# Patient Record
Sex: Female | Born: 1935
Health system: Southern US, Community
[De-identification: ages and names within clinical notes are randomized; demographics above are authoritative.]

## PROBLEM LIST (undated history)

## (undated) DIAGNOSIS — I482 Chronic atrial fibrillation, unspecified: Secondary | ICD-10-CM

## (undated) DIAGNOSIS — I10 Essential (primary) hypertension: Secondary | ICD-10-CM

## (undated) DIAGNOSIS — I2510 Atherosclerotic heart disease of native coronary artery without angina pectoris: Secondary | ICD-10-CM

## (undated) DIAGNOSIS — D509 Iron deficiency anemia, unspecified: Secondary | ICD-10-CM

## (undated) DIAGNOSIS — E039 Hypothyroidism, unspecified: Secondary | ICD-10-CM

## (undated) DIAGNOSIS — N390 Urinary tract infection, site not specified: Secondary | ICD-10-CM

## (undated) DIAGNOSIS — G4700 Insomnia, unspecified: Secondary | ICD-10-CM

## (undated) DIAGNOSIS — N814 Uterovaginal prolapse, unspecified: Secondary | ICD-10-CM

## (undated) DIAGNOSIS — M1990 Unspecified osteoarthritis, unspecified site: Secondary | ICD-10-CM

## (undated) DIAGNOSIS — M8580 Other specified disorders of bone density and structure, unspecified site: Secondary | ICD-10-CM

## (undated) DIAGNOSIS — E785 Hyperlipidemia, unspecified: Secondary | ICD-10-CM

## (undated) HISTORY — DX: Uterovaginal prolapse, unspecified: N81.4

## (undated) HISTORY — DX: Essential (primary) hypertension: I10

## (undated) HISTORY — DX: Chronic atrial fibrillation, unspecified: I48.20

## (undated) HISTORY — PX: ABDOMINAL HYSTERECTOMY: SHX81

## (undated) HISTORY — DX: Hyperlipidemia, unspecified: E78.5

## (undated) HISTORY — PX: OVARIAN CYST REMOVAL: SHX89

## (undated) HISTORY — DX: Hypothyroidism, unspecified: E03.9

## (undated) HISTORY — DX: Iron deficiency anemia, unspecified: D50.9

## (undated) HISTORY — PX: TONSILLECTOMY: SUR1361

## (undated) HISTORY — PX: ROTATOR CUFF REPAIR: SHX139

## (undated) HISTORY — PX: LIPOMA EXCISION: SHX5283

## (undated) HISTORY — DX: Insomnia, unspecified: G47.00

## (undated) HISTORY — DX: Other specified disorders of bone density and structure, unspecified site: M85.80

## (undated) HISTORY — PX: TEAR DUCT PROBING: SHX793

## (undated) HISTORY — DX: Atherosclerotic heart disease of native coronary artery without angina pectoris: I25.10

## (undated) HISTORY — DX: Unspecified osteoarthritis, unspecified site: M19.90

## (undated) HISTORY — DX: Urinary tract infection, site not specified: N39.0

## (undated) HISTORY — PX: APPENDECTOMY: SHX54

---

## 1999-11-09 ENCOUNTER — Other Ambulatory Visit: Admission: RE | Admit: 1999-11-09 | Discharge: 1999-11-09 | Payer: Self-pay | Admitting: Internal Medicine

## 1999-12-09 ENCOUNTER — Encounter: Admission: RE | Admit: 1999-12-09 | Discharge: 1999-12-09 | Payer: Self-pay | Admitting: Internal Medicine

## 1999-12-09 ENCOUNTER — Encounter: Payer: Self-pay | Admitting: Internal Medicine

## 2000-11-29 ENCOUNTER — Other Ambulatory Visit: Admission: RE | Admit: 2000-11-29 | Discharge: 2000-11-29 | Payer: Self-pay | Admitting: Internal Medicine

## 2000-12-09 ENCOUNTER — Encounter: Payer: Self-pay | Admitting: Internal Medicine

## 2000-12-09 ENCOUNTER — Encounter: Admission: RE | Admit: 2000-12-09 | Discharge: 2000-12-09 | Payer: Self-pay | Admitting: Internal Medicine

## 2001-05-29 ENCOUNTER — Encounter: Payer: Self-pay | Admitting: Internal Medicine

## 2001-05-29 ENCOUNTER — Encounter: Admission: RE | Admit: 2001-05-29 | Discharge: 2001-05-29 | Payer: Self-pay | Admitting: Internal Medicine

## 2001-11-30 ENCOUNTER — Other Ambulatory Visit: Admission: RE | Admit: 2001-11-30 | Discharge: 2001-11-30 | Payer: Self-pay | Admitting: Internal Medicine

## 2001-12-11 ENCOUNTER — Encounter: Admission: RE | Admit: 2001-12-11 | Discharge: 2001-12-11 | Payer: Self-pay | Admitting: Internal Medicine

## 2001-12-11 ENCOUNTER — Encounter: Payer: Self-pay | Admitting: Internal Medicine

## 2002-03-16 ENCOUNTER — Encounter: Payer: Self-pay | Admitting: Internal Medicine

## 2002-03-16 ENCOUNTER — Encounter: Admission: RE | Admit: 2002-03-16 | Discharge: 2002-03-16 | Payer: Self-pay | Admitting: Internal Medicine

## 2002-12-18 ENCOUNTER — Encounter: Admission: RE | Admit: 2002-12-18 | Discharge: 2002-12-18 | Payer: Self-pay | Admitting: Internal Medicine

## 2002-12-18 ENCOUNTER — Encounter: Payer: Self-pay | Admitting: Internal Medicine

## 2003-12-24 ENCOUNTER — Other Ambulatory Visit: Admission: RE | Admit: 2003-12-24 | Discharge: 2003-12-24 | Payer: Self-pay | Admitting: Internal Medicine

## 2003-12-25 ENCOUNTER — Encounter: Admission: RE | Admit: 2003-12-25 | Discharge: 2003-12-25 | Payer: Self-pay | Admitting: Internal Medicine

## 2004-12-25 ENCOUNTER — Encounter: Admission: RE | Admit: 2004-12-25 | Discharge: 2004-12-25 | Payer: Self-pay | Admitting: Internal Medicine

## 2005-11-01 HISTORY — PX: CORONARY ARTERY BYPASS GRAFT: SHX141

## 2005-12-27 ENCOUNTER — Encounter: Admission: RE | Admit: 2005-12-27 | Discharge: 2005-12-27 | Payer: Self-pay | Admitting: Internal Medicine

## 2006-06-09 ENCOUNTER — Encounter: Admission: RE | Admit: 2006-06-09 | Discharge: 2006-06-09 | Payer: Self-pay | Admitting: Interventional Cardiology

## 2006-06-14 ENCOUNTER — Encounter (INDEPENDENT_AMBULATORY_CARE_PROVIDER_SITE_OTHER): Payer: Self-pay | Admitting: *Deleted

## 2006-06-14 ENCOUNTER — Inpatient Hospital Stay (HOSPITAL_COMMUNITY): Admission: RE | Admit: 2006-06-14 | Discharge: 2006-06-23 | Payer: Self-pay | Admitting: Interventional Cardiology

## 2006-07-28 ENCOUNTER — Encounter: Admission: RE | Admit: 2006-07-28 | Discharge: 2006-07-28 | Payer: Self-pay | Admitting: Cardiothoracic Surgery

## 2006-07-29 ENCOUNTER — Ambulatory Visit (HOSPITAL_COMMUNITY): Admission: RE | Admit: 2006-07-29 | Discharge: 2006-07-29 | Payer: Self-pay | Admitting: Cardiothoracic Surgery

## 2006-08-09 ENCOUNTER — Encounter: Admission: RE | Admit: 2006-08-09 | Discharge: 2006-08-09 | Payer: Self-pay | Admitting: Cardiothoracic Surgery

## 2007-11-02 HISTORY — PX: CATARACT EXTRACTION, BILATERAL: SHX1313

## 2008-09-23 ENCOUNTER — Encounter: Admission: RE | Admit: 2008-09-23 | Discharge: 2008-09-23 | Payer: Self-pay | Admitting: Internal Medicine

## 2009-01-28 ENCOUNTER — Encounter: Admission: RE | Admit: 2009-01-28 | Discharge: 2009-01-28 | Payer: Self-pay | Admitting: Interventional Cardiology

## 2009-12-30 DIAGNOSIS — N390 Urinary tract infection, site not specified: Secondary | ICD-10-CM

## 2009-12-30 HISTORY — DX: Urinary tract infection, site not specified: N39.0

## 2010-06-22 ENCOUNTER — Other Ambulatory Visit: Admission: RE | Admit: 2010-06-22 | Discharge: 2010-06-22 | Payer: Self-pay | Admitting: Obstetrics and Gynecology

## 2010-11-21 ENCOUNTER — Encounter: Payer: Self-pay | Admitting: Thoracic Surgery (Cardiothoracic Vascular Surgery)

## 2010-11-22 ENCOUNTER — Encounter: Payer: Self-pay | Admitting: Internal Medicine

## 2011-01-20 ENCOUNTER — Encounter (HOSPITAL_COMMUNITY)
Admission: RE | Admit: 2011-01-20 | Discharge: 2011-01-20 | Disposition: A | Discharge status: Home / Self Care | Payer: Medicare Other | Source: Ambulatory Visit | Attending: Obstetrics and Gynecology | Admitting: Obstetrics and Gynecology

## 2011-01-20 LAB — BASIC METABOLIC PANEL
BUN: 12 mg/dL (ref 6–23)
Creatinine, Ser: 0.79 mg/dL (ref 0.4–1.2)
GFR calc non Af Amer: >60 mL/min (ref >60)

## 2011-01-20 LAB — SURGICAL PCR SCREEN: Staphylococcus aureus: POSITIVE — AB

## 2011-01-20 LAB — CBC
MCH: 28.1 pg (ref 26.0–34.0)
MCHC: 31.2 g/dL (ref 30.0–36.0)
MCV: 90.1 fL (ref 78.0–100.0)
Platelets: 173 10*3/uL (ref 150–400)
RDW: 13.5 % (ref 11.5–15.5)

## 2011-01-26 ENCOUNTER — Ambulatory Visit (HOSPITAL_COMMUNITY)
Admission: RE | Admit: 2011-01-26 | Discharge: 2011-01-27 | Disposition: A | Discharge status: Home / Self Care | Payer: Medicare Other | Source: Ambulatory Visit | Attending: Obstetrics and Gynecology | Admitting: Obstetrics and Gynecology

## 2011-01-26 ENCOUNTER — Other Ambulatory Visit: Payer: Self-pay | Admitting: Obstetrics and Gynecology

## 2011-01-26 DIAGNOSIS — Z01818 Encounter for other preprocedural examination: Secondary | ICD-10-CM | POA: Insufficient documentation

## 2011-01-26 DIAGNOSIS — N812 Incomplete uterovaginal prolapse: Secondary | ICD-10-CM | POA: Insufficient documentation

## 2011-01-26 DIAGNOSIS — N840 Polyp of corpus uteri: Secondary | ICD-10-CM | POA: Insufficient documentation

## 2011-01-26 DIAGNOSIS — Z01812 Encounter for preprocedural laboratory examination: Secondary | ICD-10-CM | POA: Insufficient documentation

## 2011-01-27 LAB — CBC
Hemoglobin: 10.3 g/dL — ABNORMAL LOW (ref 12.0–15.0)
MCH: 28.3 pg (ref 26.0–34.0)
MCHC: 31.5 g/dL (ref 30.0–36.0)
Platelets: 125 10*3/uL — ABNORMAL LOW (ref 150–400)

## 2011-02-04 NOTE — Op Note (Signed)
NAMEORAL, HALLGREN                ACCOUNT NO.:  [id number]  MEDICAL RECORD NO.:  [id number]           PATIENT TYPE:  O  LOCATION:  9312                          FACILITY:  [facility]  PHYSICIAN:  Brigitte Soderberg L. Jamelle Goldston, M.D.DATE OF BIRTH:  1932-07-16  DATE OF PROCEDURE:  01/26/2011 DATE OF DISCHARGE:                              OPERATIVE REPORT   PREOPERATIVE DIAGNOSES:  Uterine prolapse, cystocele, and rectocele.  POSTOPERATIVE DIAGNOSES:  Uterine prolapse, cystocele, and rectocele.  PROCEDURE: 1. Total vaginal hysterectomy. 2. Rogelio Seen culdoplasty. 3. Anterior repair. 4. Posterior repair.  SURGEON:  Makara Lanzo L. Vincente Poli, M.D.  ASSISTANTFreddy Finner, M.D.  ANESTHESIA:  Combined spinal epidural.  FINDINGS:  Uterus prolapse, cystocele, and rectocele.  SPECIMENS:  Uterus and cervix sent to Pathology.  ESTIMATED BLOOD LOSS:  100 mL.  COMPLICATIONS:  None.  PROCEDURE:  The patient is taken to the operating room.  Her anesthesia was administered.  She was then prepped and draped in the usual sterile fashion.  A Foley catheter was inserted.  A weighted speculum was placed in the vagina.  She was noted to have grade 3 cervical uterine prolapse. A circumferential incision was made around the cervix and the overlying vaginal epithelium was then pushed away from the cervix until we entered the cul-de-sac posteriorly.  We then clamped the uterosacral cardinal ligaments.  Each pedicle was clamped, cut, and suture ligated using 0 Vicryl suture.  The cervix was very long, but the body of the uterus was very small.  We were then able to enter the anterior cul-de-sac without any difficulty and then clamp the broad ligament on either side.  Each pedicle was clamped, cut, and suture ligated using 0 Vicryl suture and then we reached the triple pedicle without difficulty, placed clamps across that.  The specimen was removed, identified the cervix and uterus and the pedicles were secured with a  free tie in a suture ligature of 0 Vicryl suture.  The ovaries were very high in the pelvis.  We could visualize it, they appeared normal, but they were not able to be removed vaginally.  At this point, I then placed a McCall stitch in the cul-de- sac to minimize chance of enterocele later on, any help with suspension of the vaginal cuff.  It was suspended to the posterior vaginal cuff. The posterior vaginal cuff was then closed with a running locked stitch using 0 Vicryl suture.  The poster two thirds of the cuff was then closed completely using interrupted using 0 Vicryl.  At this point, we still noticed a grade 3 cystocele.  We then made a midline incision over the vaginal epithelium, reduced the vesicovaginal tissue, reduced the cystocele nicely, and then closed with 2 pursestrings using 0 Vicryl suture.  We then excised the redundant vaginal epithelium and closed the overlying vaginal epithelium with nice reduction of the cystocele with 2- 0 Vicryl suture.  We then tied down the McCall stitch.  I had excellent suspension of the anterior compartment as well as the vaginal cuff.  At this point, we went posteriorly.  She had a very small rectocele, but  she did have kind of a gaping perineum, and so I decided to do a small rectocele repair with perineoplasty.  A small V-shaped incision was made in the perineum.  This was carried up about 2 cm into the vagina.  The vaginal epithelium was dissected free from the rectovaginal fascia.  The stitches were placed to reapproximate the fascia in the midline.  The redundant tissue was removed and then the perineum was brought together with a 0 Vicryl stitch of the perineum and then the vaginal epithelium was closed using 2-0 Vicryl running stitch.  At the end of the procedure, hemostasis was excellent.  She had wonderful urine output. There was no oozing from any of the suture lines.  Vaginal packing with Estrace cream was inserted in to the  vagina.  All sponge, lap, and instrument counts were correct x2.  The patient went to recovery room in stable condition.     Ruthia Person L. Vincente Poli, M.D.     Florestine Avers  D:  01/26/2011  T:  01/27/2011  Job:  [id number]  Electronically Signed by Marcelle Overlie M.D. on 02/04/2011 07:14:21 AM

## 2011-03-19 NOTE — Op Note (Signed)
[name], [name]                ACCOUNT NO.:  [id number]   MEDICAL RECORD NO.:  [id number]          PATIENT TYPE:  INP   LOCATION:  2306                         FACILITY:  [facility]   PHYSICIAN:  Salvatore Decent. Dorris Fetch, M.D.DATE OF BIRTH:  1932/04/25   DATE OF PROCEDURE:  06/16/2006  DATE OF DISCHARGE:                                 OPERATIVE REPORT   PREOPERATIVE DIAGNOSIS:  Left main disease with exertional angina.   POSTOPERATIVE DIAGNOSIS:  Left main disease with exertional angina.   PROCEDURE:  Median sternotomy, extracorporeal circulation, coronary bypass  grafting x2 (left internal mammary artery to LAD, saphenous vein graft to  left circumflex at the bifurcation to obtuse marginal II and III) endoscopic  vein harvest right thigh.   SURGEON:  Salvatore Decent. Dorris Fetch, M.D.   ASSISTANT:  Rowe Clack, P.A.-C.   ANESTHESIA:  General.   FINDINGS:  A good-quality targets, good-quality conduits, diagonal too small  to graft.   CLINICAL NOTE:  [name] is a 77 year old lady who presented with exertional  angina.  Her stress test showed some mild ECG abnormalities, but normal  perfusion.  Attempt was made to treat her medically but she continued to  have symptoms.  She underwent cardiac catheterization by [name] which  showed a 70-75% ostial left main stenosis.  She was advised to undergo  coronary bypass grafting for survival benefit and relief of symptoms.  She  understood the risks of surgery, accepted and agreed to proceed.   OPERATIVE NOTE:  [name] was brought to the preop holding area on  06/16/2006.  There the anesthesia service under the direction of Dr. Sharee Holster placed lines for monitoring arterial, central venous and pulmonary  arterial pressure.  ECG leads were placed for continuous telemetry.  Intravenous antibiotics were administered.  She was taken to the operating  room, anesthetized and intubated.  A Foley catheter was placed.  The chest,  abdomen and legs were prepped and draped in the usual fashion.   A median sternotomy was performed and the left internal mammary artery was  harvested using standard technique.  Simultaneously incision made in the  medial aspect of the right leg at the level of the knee and the greater  saphenous vein was harvested from the thigh endoscopically.  The saphenous  vein was of excellent quality as was the mammary artery.  5000 units of  heparin was administered during the vessel harvest, the remainder of the  full heparin dose was given prior to opening the pericardium.   The pericardium was opened.  The ascending aorta was inspected and there is  no evidence of atherosclerotic disease.  The aorta was cannulated via  concentric 2-0 Ethibond pledgeted pursestring sutures.  A dual stage venous  cannula placed via pursestring suture in the right atrial appendage.  After  confirming adequate anticoagulation with ACT measurement, cardiopulmonary  bypass was commenced and flows were maintained per protocol.  The patient  was not actively cooled but the temperature was allowed to drift to 34  degrees Celsius.  The coronary arteries were inspected and anastomotic  sites  were chosen.  The conduits were inspected and cut to length.  A foam pad was  placed in the pericardium to protect the left phrenic nerve.  A temperature  probe was placed in the myocardial septum.  A cardioplegic cannula placed in  the ascending aorta.   The aorta was crossclamped, left ventricle was emptied via the aortic root  vent.  A cardiac arrest was achieved with combination of cold antegrade  blood cardioplegia and topical iced saline.  After achieving a complete  diastolic arrest adequate myocardial septal cooling the following distal  anastomoses were performed.   First a reverse saphenous vein graft was placed end-to-side to left  circumflex, the site of bifurcation OM II and III, this was a 2 mm vessel  1.5 mm probe  passed into both distal branches.  The vein was of good  quality, it was anastomosed end-to-side with running 7-0 Prolene suture.  Both anastomoses were probed proximally, distally prior to tying the suture.  There is good flow through the graft.  Cardioplegia was administered.  There  was good hemostasis.   Additional cardioplegia then was administered down the aortic root and the  left internal mammary artery was brought through a window in the  pericardium.  The distal end was beveled and was anastomosed end-to-side to  the distal LAD with a running 8-0 Prolene suture.  Both the LAD and the  mammary were 1.5 mm good quality vessels.  At completion of the mammary to  LAD anastomosis, the bulldog clamps briefly removed to inspect for  hemostasis.  Immediate rapid septal rewarming was noted.  The bulldog clamp  was replaced.  The mammary pedicle was tacked epicardial surface of the  heart with 6-0 Prolene sutures.  Additional cardioplegia was administered.   The cardioplegia cannula was removed from the ascending aorta.  The vein  graft was cut to length and the proximal vein graft anastomosis was  performed to 4.5 mm punch aortotomy with a running 6-0 Prolene suture.  At  completion of the proximal anastomosis, the patient was placed in  Trendelenburg position.  Lidocaine was administered.  The aortic root was de-  aired and the aortic crossclamp was removed.  The total crossclamp time was  39 minutes.  The patient was rewarmed.  All proximal and distal anastomoses  were inspected for hemostasis.  Epicardial pacing wires were placed on the  right ventricle and right atrium.  The patient did require two  defibrillations but then resumed sinus rhythm and was in sinus rhythm at  time separation from bypass.  After the patient been rewarmed to a core  temperature of 37 degrees Celsius, he was weaned from cardiopulmonary bypass without difficulty.  The total bypass time was 54 minutes.  The  initial  cardiac index was greater than 2 liters per minute per meter squared.   A test dose protamine was administered was well tolerated.  The atrial and  aortic cannulae were removed.  The remaining protamine was administered  without incident.  Chest irrigated with 1 liter of warm normal saline  containing 1 gram of vancomycin.  Hemostasis was achieved.  The pericardium  was reapproximated with interrupted 3-0 silk sutures and came together  easily without tension.  The left pleural and mediastinal chest tubes were  placed through separate subcostal incisions.  The sternum was closed with  interrupted heavy gauge stainless steel wires, it came together easily  without tension.  The remaining incisions closed standard fashion.  Subcuticular closure used for the skin.  All sponge, needle and sponge  counts were correct at end of the procedure.   After closure the chest, the patient had a drop in her cardiac index.  It  had only been checked one time post bypass. Her index was 1.5 after closure  of the chest.  All other parameters remained the same.  She had excellent  urine output, her blood pressure, PA pressures were stable.  Her heart rate  was stable.  She did not have any arrhythmias and no ST changes.  A low-dose  dopamine infusion was initiated as a precaution.  There was no indication to  reopen  the chest.  A mixed venous blood gas was performed and was consistent with  excellent cardiac output with a pO2 of 40 and a mixed venous oxygen  saturation of 70%.  The patient then was transported from the operating room  to the surgical intensive care unit in good condition.           ______________________________  Salvatore Decent Dorris Fetch, M.D.     SCH/MEDQ  D:  06/16/2006  T:  06/16/2006  Job:  [id number]   cc:   Corky Crafts, MD  Theressa Millard, M.D.

## 2011-03-19 NOTE — Discharge Summary (Signed)
[name], [name]                ACCOUNT NO.:  [id number]   MEDICAL RECORD NO.:  [id number]          Walker TYPE:  INP   LOCATION:  2021                         FACILITY:  [facility]   PHYSICIAN:  Salvatore Decent. Dorris Fetch, M.D.DATE OF BIRTH:  [birth year]   DATE OF ADMISSION:  06/14/2006  DATE OF DISCHARGE:                                 DISCHARGE SUMMARY   DATE OF BIRTH:  [birth year]   PRIMARY DIAGNOSIS:  Left main disease with exertional angina.   IN HOSPITAL DIAGNOSES:  1. Postoperative thrombocytopenia.  2. Postoperative acute blood loss anemia.  3. Volume overload postoperatively.   SECONDARY DIAGNOSES:  1. Hypertension.  2. Hyperthyroidism.  3. Hyperlipidemia.  4. Status post appendectomy.  5. Status post ovarian cystectomy.  6. Status post removal of lipoma from right upper abdomen.  7. Status post left tear duct repair.   IN HOSPITAL OPERATIONS AND PROCEDURES:  1. Cardiac catheterization with left heart catheterization, left      ventriculogram, coronary angiogram, and abdominal aortogram.  2. Coronary artery bypass grafting x2 using Marisa left internal mammary      artery to left anterior descending, saphenous vein graft to left      circumflex at Marisa bifurcation to obtuse marginal 2 and 3.  Endoscopic      vein harvesting from right thigh.   HISTORY AND PHYSICAL AND HOSPITAL COURSE:  [name] is a 77 year old female  who presented with exertional angina.  Stress test was done, which showed  some mild EKG abnormalities, but with normal perfusion.  Attempt was made to  treat her medically, but Walker continued to have symptoms.  [name]  took her for cardiac catheterization on 06/16/2006, which showed a 70% to  75% ostial left main stenosis.  Following this, Dr. Dorris Fetch was  consulted.  Dr. Dorris Fetch discussed with Walker underlying coronary  artery bypass grafting.  He discussed Marisa risks and benefits with Walker.  [name] acknowledged her understanding  and agreed to proceed.  Surgery  was scheduled for 06/16/2006.  Prior to undergoing surgery, [name]  underwent bilateral carotid duplex ultrasound which showed no significant  stenosis.  For details of [name]'s past medical history and physical  exam, please see dictated history and physical.   [name] was taken to Marisa operating room on 06/16/2006, where she  underwent coronary artery bypass grafting x2 using a left internal mammary  artery to Marisa left anterior descending, saphenous vein graft to left  circumflex at Marisa bifurcation to obtuse marginal 2 and 3.  [name]  tolerated this procedure well and was transferred up to Marisa intensive care  unit in stable condition.  Immediately following surgery, [name] seemed  to be hemodynamically stable.  Hematocrit was 29%.  [name] was  extubated Marisa evening of surgery.  Following extubation, [name] seemed  to be alert and oriented x3, neurologically intact.  In Marisa initial  postoperative period, [name] developed hypotension.  She required  dopamine and Neo-Synephrine drips.  She was able to be weaned off these  drips over Marisa next day and  blood pressure remained stable.  Systolic blood  pressure still borderline low.  Beta-blocker was started slowly and this was  monitored closely.  [name] was stable with systolic blood pressure in  Marisa 90s on postoperative day 5.  Again, this was monitored closely during  Marisa remainder of her hospital stay.  Also, postoperatively [name]  developed thrombocytopenia.  Her platelet count dropped to 111.  This was  monitored.  Over Marisa next several days, it did start to trend up.  Marisa  Walker remained on a 325 aspirin.  By postoperative day 3, it was 113.  On  postoperative day 2, [name]'s hemoglobin and hematocrit dropped to 8.7  and 25.  She was asymptomatic.  This was monitored closely.  It did trend  back up postoperative day 3 to 9.4 and 27.  [name] was  also noted to be  volume overloaded.  She was started on diuretics.  Weight was near baseline  prior to discharge home.  She preoperatively weighed 63.6 kg and on  postoperative day 5 was noted to be 66.9 kg.  During Marisa initial  postoperative phase, [name] complained of nausea.  Pain medications  were switched.  Bowel sounds were good with bowel movement on postoperative  day 4.  After switch of Marisa pain medications, [name]'s nausea did start  to resolve.  She was tolerating a regular diet well prior to discharge home.   Chest tubes and lines were discontinued in Marisa normal fashion.  [name]  was transferred out to 2000 on postoperative day 3.  [name] was seen to  be afebrile postoperatively.  She remained in normal sinus rhythm.  With  aggressive use of incentive spirometer, [name] was able to be weaned  off oxygen, satting greater than 90% on room air.  Incisions were clean,  dry, and intact, and healing well.  She was out of bed ambulating well.   [name] is tentatively ready for discharge home postoperative day six,  06/22/2006.  Followup appointment has been scheduled with Dr. Tyrone Sage for  07/14/2006 at 11:20 a.m.  [name] will need to contact Dr. Hoyle Barr  office and schedule a followup appointment with him in 2 weeks.  At this  appointment, [name] will obtain a PA and lateral chest x-ray, which she  will then bring with her to Dr. Dennie Maizes appointment.   ACTIVITY:  [name] received instructions on no driving and no lifting over  10 pounds.  She is told to ambulate 3-4 times per day, progress as  tolerated, and continue her breathing exercises.   INCISIONAL CARE:  [name] is told to wash her incisions using soap and  water.  She is to contact Marisa office if she develops any drainage or opening  from any of her incision sites.  She is also to contact Marisa office if she develops a fever greater than 101.0 degrees Fahrenheit.  She  acknowledged  her understanding.   DIET:  [name] is educated on diet to be low-fat low-salt.  She  acknowledged her understanding.   DISCHARGE MEDICATIONS:  1. Aspirin 325 mg daily.  2. Toprol-XL 25 mg daily.  3. Lipitor 10 mg at night.  4. Synthroid 75 mcg daily.  5. Lasix 40 mg daily x3 days.  6. Potassium chloride 20 mEq daily x3 days.  7. Fosamax 70 mg weekly.  8. Darvocet-N 100 one to two tabs q.4-6 hours p.r.n. pain.  Theda Belfast, PA    ______________________________  Salvatore Decent Dorris Fetch, M.D.    KMD/MEDQ  D:  06/21/2006  T:  06/21/2006  Job:  [id number]   cc:   Salvatore Decent. Dorris Fetch, M.D.  Corky Crafts, MD

## 2011-03-19 NOTE — Cardiovascular Report (Signed)
NAMEJENNY, [name]                ACCOUNT NO.:  [id number]   MEDICAL RECORD NO.:  [id number]          PATIENT TYPE:  INP   LOCATION:  2306                         FACILITY:  [facility]   PHYSICIAN:  Corky Crafts, MDDATE OF BIRTH:  06-01-1932   DATE OF PROCEDURE:  06/16/2006  DATE OF DISCHARGE:                              CARDIAC CATHETERIZATION   REFERRING PHYSICIAN:  Theressa Millard, M.D.   PROCEDURES PERFORMED:  Left heart catheterization, left ventriculogram,  coronary angiogram, and abdominal aortogram.   OPERATOR:  Corky Crafts, M.D.   INDICATIONS:  Chest pain.   PROCEDURE:  The risks, benefits of cardiac catheterization were explained to  the patient and informed consent was obtained. The patient was brought to  the cath lab and placed on the table.  She was prepped and draped in usual  sterile fashion.  Her right groin was infiltrated with 1% lidocaine. A 6-  [place] arterial sheath was placed into a right femoral artery using modified  Seldinger technique.  Left coronary artery angiography was performed using a  JL-4.0 catheter.  The catheter was advanced to the ostium of the left main  under fluoroscopic guidance.  Digital angiography was performed in multiple  projections using hand injection of contrast. Right coronary artery  angiography was then performed. A JR-4.0 catheter was advanced to the ostium  of the right coronary artery and digital angiography was performed in  multiple projections using hand injection of contrast. Left ventriculography  was then performed using a pigtail catheter. Catheter was advanced to the  aortic valve and across the valve into the ventricle under fluoroscopic  guidance.  Hemodynamic pressure assessment was performed.  A 30 degree RAO  ventriculogram was then performed using power injection of contrast.  Catheter was withdrawn under continuous hemodynamic pressure monitoring and  the catheter was then pulled back to the  abdominal aorta to the level of the  renal arteries.  A power injection contrast was done in the AP projection  for aortogram and the sheath will then be removed using manual compression.   FINDINGS:  There is a 70% left main stenosis at the ostium. There was  significant pressure damping with a 6-French catheter.  The LAD is a large  vessel with a 70% midvessel stenosis.  The circumflex is a large vessel  which was angiographically normal.  There is a medium-sized OM-1 and OM-2  which also appeared angiographically normal.  The first diagonal was a  branching vessel which was angiographically normal. The right coronary  artery is a dominant vessel with luminal irregularities.  The left  ventriculogram showed normal left jugular function with ejection fraction of  60%.   HEMODYNAMIC RESULTS:  Left ventricular pressure 139/7 with an LVEDP of 16  mmHg.  Aortic pressure of 136/68 with a mean aortic pressure of 96 mmHg.  The abdominal aortogram showed a small suprarenal aortic aneurysm. There is  no significant renal artery stenosis.   IMPRESSION:  1. Significant left main and left anterior descending disease.  2. Normal left ventricular function with normal hemodynamics.  3. No renal artery  stenosis.   RECOMMENDATIONS:  Given the severity of her left main disease we will  consult CVTS for CABG.  We will continue aspirin and consider starting  heparin as well. Continue her beta blocker and statin.      Corky Crafts, MD  Electronically Signed     JSV/MEDQ  D:  06/16/2006  T:  06/17/2006  Job:  [id number]

## 2011-03-19 NOTE — Consult Note (Signed)
[name], [name]                ACCOUNT NO.:  [id number]   MEDICAL RECORD NO.:  [id number]          PATIENT TYPE:  INP   LOCATION:  3704                         FACILITY:  [facility]   PHYSICIAN:  Salvatore Decent. Dorris Fetch, M.D.DATE OF BIRTH:  12-04-1931   DATE OF CONSULTATION:  06/14/2006  DATE OF DISCHARGE:                                   CONSULTATION   REASON FOR CONSULTATION:  Left main disease.   HISTORY OF PRESENT ILLNESS:  [name] is a 77 year old woman who I have  known for many years in caring for [name].  This is my first doctor-  patient contact with Marisa, however.  She was in Marisa usual state of good  health until June of this year when she started having chest tightness and  shortness of breath when walking Marisa dog.  She has not had any rest or  nocturnal symptoms, but she had noted that this pain was increasing in  frequency.  She was concerned.  [name] has a history of coronary  disease and saw Dr. Earl Gala.  A stress sestamibi was performed, and there  were some ECG changes which reversed, but Marisa baseline ECG was abnormal.  There was no evidence for ischemia on the perfusion images.  She had normal  left ventricular function.  She was started on a medical regimen but  continued to have discomfort and was scheduled for cardiac catheterization.  Cardiac catheterization was performed today, and she was found to have a 70%  ostial left main stenosis, as well as a 70% mid-LAD stenosis.  The remainder  of Marisa coronaries did not have any significant disease, and Marisa left  ventricular function was normal.  She currently is pain free.   PAST MEDICAL HISTORY:  Significant for hypertension, hyperthyroidism,  dyslipidemia.   PAST SURGICAL HISTORY:  Appendectomy, ovarian cystectomy, lipoma removed  from right upper abdomen, and left tear duct repair.   MEDICATIONS:  1. Synthroid 75 mcg p.o. daily.  2. Lipitor 10 mg p.o. daily.  3. Metoprolol 25 mg p.o. daily.  4.  Triamterene/hydrochlorothiazide 37.5/25 one tablet daily.  5. Baby aspirin 1 daily.  6. Fosamax 70 mg weekly.  7. Nitroglycerin p.r.n.   ALLERGIES:  SHE IS ALLERGIC TO SULFA.   FAMILY HISTORY:  There is no history of premature coronary disease.   SOCIAL HISTORY:  She is married.  She lives with [name].  She is a  retired Librarian, academic.  She does not smoke or drink.   REVIEW OF SYSTEMS:  Chest pain, shortness of breath in association with the  chest pain.  She wears corrective lenses.  Otherwise, all other systems are  negative.   PHYSICAL EXAMINATION:  GENERAL:  The patient is a well-appearing 77 year old  white female in no acute distress.  VITAL SIGNS:  Marisa blood pressure is 118/73, pulse 55, respirations 18,  oxygen saturation 95% on room air.  She is 5 feet 5 inches tall and 140  pounds.  In general, she is well developed and well nourished in no acute  distress.  HEENT:  She  is wearing glasses.  Otherwise unremarkable.  She has good  dentition.  NECK:  Supple without thyromegaly, adenopathy, or bruits.  LUNGS:  Clear with equal breath sounds bilaterally.  CARDIAC:  Regular rate and rhythm.  Normal S1 and S2.  No rubs, murmurs, or  gallops.  ABDOMEN:  Soft and nontender.  EXTREMITIES:  Without clubbing, cyanosis, or edema.  She has palpable  pulses, dorsalis pedis and posterior tibial, bilaterally.  She has some mild  superficial varicosities.  NEUROLOGIC:  She is alert and oriented x3.  SKIN:  Warm and dry.   LABORATORY DATA:  White count 6.1, hematocrit 40, platelets 247.  PT 11.4,  PTT 34.  Sodium 137, potassium 3.9, BUN and creatinine 14 and 0.9, glucose  82.  Chest x-ray has no active disease.   IMPRESSION:  The patient is a very sweet 77 year old lady who presents with  new-onset exertional angina.  At catheterization, she has a 70% ostial left  main stenosis.  Coronary artery bypass grafting is indicated for survival  benefit and relief of symptoms.   I have discussed with Marisa and [name]  the indications, risks, benefits, and alternative treatments.  They  understand the need for bypass grafting in the setting of left main disease.  They understand the risks include but are not limited to death, stroke, MI,  DVT, PE, bleeding, possible need for transfusion, infection, as well as  other organ/system dysfunction, including respiratory and renal or GI  complications.  She understands and accepts these risks and agrees to  proceed.  We will plan to proceed with Marisa operation on Thursday morning.  That is the first available operating date.  That is June 16, 2006.           ______________________________  Salvatore Decent. Dorris Fetch, M.D.     SCH/MEDQ  D:  06/14/2006  T:  06/15/2006  Job:  [id number]   cc:   [name], M.D.  Corky Crafts, MD

## 2011-12-17 ENCOUNTER — Other Ambulatory Visit: Payer: Self-pay | Admitting: Obstetrics and Gynecology

## 2013-07-25 ENCOUNTER — Other Ambulatory Visit: Payer: Self-pay | Admitting: Obstetrics and Gynecology

## 2013-10-29 ENCOUNTER — Encounter: Payer: Self-pay | Admitting: Cardiology

## 2013-10-29 DIAGNOSIS — E039 Hypothyroidism, unspecified: Secondary | ICD-10-CM | POA: Insufficient documentation

## 2013-10-29 DIAGNOSIS — I4891 Unspecified atrial fibrillation: Secondary | ICD-10-CM

## 2013-10-29 DIAGNOSIS — I209 Angina pectoris, unspecified: Secondary | ICD-10-CM | POA: Insufficient documentation

## 2013-10-29 DIAGNOSIS — M899 Disorder of bone, unspecified: Secondary | ICD-10-CM | POA: Insufficient documentation

## 2013-10-29 DIAGNOSIS — I2510 Atherosclerotic heart disease of native coronary artery without angina pectoris: Secondary | ICD-10-CM | POA: Insufficient documentation

## 2013-10-29 DIAGNOSIS — I10 Essential (primary) hypertension: Secondary | ICD-10-CM

## 2013-11-08 ENCOUNTER — Encounter: Payer: Self-pay | Admitting: Interventional Cardiology

## 2013-11-08 ENCOUNTER — Ambulatory Visit (INDEPENDENT_AMBULATORY_CARE_PROVIDER_SITE_OTHER): Payer: 59 | Admitting: Interventional Cardiology

## 2013-11-08 VITALS — BP 150/82 | HR 78 | Ht 63.0 in | Wt 121.8 lb

## 2013-11-08 DIAGNOSIS — I4891 Unspecified atrial fibrillation: Secondary | ICD-10-CM

## 2013-11-08 DIAGNOSIS — R0602 Shortness of breath: Secondary | ICD-10-CM

## 2013-11-08 DIAGNOSIS — E782 Mixed hyperlipidemia: Secondary | ICD-10-CM | POA: Insufficient documentation

## 2013-11-08 DIAGNOSIS — I10 Essential (primary) hypertension: Secondary | ICD-10-CM

## 2013-11-08 DIAGNOSIS — I2510 Atherosclerotic heart disease of native coronary artery without angina pectoris: Secondary | ICD-10-CM

## 2013-11-08 LAB — BRAIN NATRIURETIC PEPTIDE: PRO B NATRI PEPTIDE: 154 pg/mL — AB (ref 0.0–100.0)

## 2013-11-08 MED ORDER — APIXABAN 5 MG PO TABS: 5.0000 mg | ORAL_TABLET | Freq: Two times a day (BID) | ORAL | Status: DC

## 2013-11-08 MED ORDER — METOPROLOL SUCCINATE ER 50 MG PO TB24: ORAL_TABLET | ORAL | Status: DC

## 2013-11-08 NOTE — Progress Notes (Signed)
Patient ID: [name], female   DOB: 03/02/1932, 80 y.o.   MRN: [id number]    [address] [place], [place]  [address] Phone: [phone]) [phone] Fax:  [phone]  Date:  11/08/2013   ID:  [name], DOB 03/11/1932, MRN [id number]  PCP:  No primary provider on file.      History of Present Illness: [name] is a 80 y.o. female with CAD who had CABG in 2007. She had hysterectomy in May 2012 and did well. In Jan 2014, she had a fullness in her left chest not related to exertion. There was left chest pain with deep breathing. This has resolved. Left leg cramps at night. No cramps with walking. CAD/ASCVD:  bowling without problems. No sx like what she had before CABG. c/o Shortness of breath when she bends down.  Denies : Diaphoresis.  Dizziness.  Dyspnea on exertion.  Fatigue.  Leg edema.  Nitroglycerin.  Orthopnea.  Palpitations.  Syncope.     Wt Readings from Last 3 Encounters:  11/08/13 121 lb 12.8 oz (55.248 kg)     Past Medical History  Diagnosis Date  . Hypertension   . Coronary artery disease     anginal equivalent=dysnpnea, resolved with CABG, dyspnea has recurred, 2014  . Osteopenia     involved on BMD 06/01/07 (T score-1.6); slightly worse in 3/13 (T score -1.6; FRAX 3%  and 12%)-plan rechcekin 2016  . Iron deficiency anemia   . Hyperlipidemia   . Hypothyroidism   . Osteoarthritis   . UTI (lower urinary tract infection) 3/11  . Uterine prolapse     post hysterectomy, Dr. Vincente PoliGrewal  . Insomnia     on [place], failed other meds    Current Outpatient Prescriptions  Medication Sig Dispense Refill  . aspirin 81 MG tablet Take 81 mg by mouth daily.      [name]. atorvastatin (LIPITOR) 20 MG tablet Take 20 mg by mouth daily.      . Calcium Carbonate-Vitamin D (CALCIUM-D PO) Take by mouth.      . Cholecalciferol (VITAMIN D) 2000 UNITS CAPS Take by mouth daily.      [name]. levothyroxine (SYNTHROID, LEVOTHROID) 75 MCG tablet Take 75 mcg by mouth. 1 tablet 6  days a week and a 1/2 tab one day a week      . metoprolol succinate (TOPROL-XL) 25 MG 24 hr tablet Take 25 mg by mouth daily.      . Misc Natural Products (OSTEO BI-FLEX ADV JOINT SHIELD PO) Take by mouth daily.      . nitroGLYCERIN (NITROSTAT) 0.4 MG SL tablet Place 0.4 mg under the tongue every 5 (five) minutes as needed for chest pain.      . Omega-3 Fatty Acids (FISH OIL) 1000 MG CAPS Take by mouth daily.      [name]. omeprazole (PRILOSEC) 20 MG capsule Take 20 mg by mouth daily.      . vitamin B-12 (CYANOCOBALAMIN) 500 MCG tablet Take 500 mcg by mouth daily.      [name]. zolpidem (AMBIEN) 5 MG tablet Take 5 mg by mouth at bedtime as needed for sleep.       No current facility-administered medications for this visit.    Allergies:    Allergies  Allergen Reactions  . Sulfa Antibiotics Rash    Social History:  The patient  reports that she has never smoked. She does not have any smokeless tobacco history on file.   Family History:  The patient's family history includes CAD in her father; Cancer in her brother; Diabetes in her brother and father; Heart attack in her father; Heart disease in her mother; Lymphoma in her sister; Syncope episode in her mother; Ulcers in her mother.   ROS:  Please see the history of present illness.  No nausea, vomiting.  No fevers, chills.  No focal weakness.  No dysuria. Fatigue, SHOB.   All other systems reviewed and negative.   PHYSICAL EXAM: VS:  BP 150/82  Pulse 78  Ht 5\' 3"  (1.6 m)  Wt 121 lb 12.8 oz (55.248 kg)  BMI 21.58 kg/m2 Well nourished, well developed, in no acute distress HEENT: normal Neck: no JVD, no carotid bruits Cardiac:  normal S1, S2; irregular Lungs:  clear to auscultation bilaterally, no wheezing, rhonchi or rales Abd: soft, nontender, no hepatomegaly Ext: no edema Skin: warm and dry Neuro:   no focal abnormalities noted  EKG:  AFib, rate controlled.     ASSESSMENT AND PLAN: AFib: Increase Toprol to 50 mg daily.  Echo. BNP to  eval SHOB. Start Eliquis for stroke prevention. Stop aspirin.  Discuss cardioversion in a few weeks Coronary atherosclerosis of native coronary artery  Stop Aspirin Tablet, 81 mg, 1 tablet, Orally, Once a day with Eliquis. Notes: Occasional chest pressure, may be related to AFib.  Negative stress test in 1/14. 2. Essential hypertension, benign  Continue Metoprolol Succinate Tablet Extended Release 24 Hour, 25 MG, 1 tablet, Orally, Once a day Notes: Well controlled.  3. Mixed hyperlipidemia  Continue Fish Oil Capsule, 1000 MG, 1 tablet, Orally, once a day Continue Lipitor Tablet, 20 MG, 1 tablet, Orally, Once a day Notes: Controlled. LDL 64 in 12/13. LD52 in 12/14L  4. Bruit  IMAGING: EC Carotid Doppler (Ordered for 02/20/2013) Notes: right carotid. Negative for significant disease in 5/14.     Signed, Fredric Mare, MD, [facility] 11/08/2013 8:14 AM

## 2013-11-08 NOTE — Patient Instructions (Addendum)
Your physician has recommended you make the following change in your medication:   1. Stop Aspirin.  2. Start Eliquis 5 mg 1 tablet by mouth twice a day.  3. Increase Metoprolol to 50 mg daily.  Your physician recommends that you schedule a follow-up appointment in: 3 weeks for OV with Dr. Eldridge DaceVaranasi and set up appt with coumadin for 3 week Eliquis follow up.  Your physician has requested that you have an echocardiogram. Echocardiography is a painless test that uses sound waves to create images of your heart. It provides your doctor with information about the size and shape of your heart and how well your heart's chambers and valves are working. This procedure takes approximately one hour. There are no restrictions for this procedure.  Your physician recommends that you return for lab work today for BNP.

## 2013-11-09 ENCOUNTER — Telehealth: Payer: Self-pay | Admitting: General Surgery

## 2013-11-09 MED ORDER — FUROSEMIDE 20 MG PO TABS: 20.0000 mg | ORAL_TABLET | Freq: Every day | ORAL | Status: DC

## 2013-11-09 NOTE — Telephone Encounter (Signed)
rx sent in for pt and pt is aware  

## 2013-11-09 NOTE — Telephone Encounter (Signed)
Message copied by Nita SellsORSON, Ladonya Jerkins L on Fri Nov 09, 2013  1:06 PM ------      Message from: Corky CraftsVARANASI, JAYADEEP S      Created: Thu Nov 08, 2013 10:14 PM       Mildly increased fluid level.  COuld try furosemide 20 mg daily.  [facility]. ------

## 2013-11-12 ENCOUNTER — Telehealth: Payer: Self-pay | Admitting: Interventional Cardiology

## 2013-11-12 DIAGNOSIS — Z79899 Other long term (current) drug therapy: Secondary | ICD-10-CM

## 2013-11-12 NOTE — Telephone Encounter (Signed)
New message  Patient is taking Lasix and there is just a slight change in the SOB. This is just a FYI.[name]. Please call if there is any further information to give her.

## 2013-11-12 NOTE — Telephone Encounter (Signed)
WILL FORWARD TO  DR  Forest GleasonVERANASI   FOR  REVIEW .Zack Seal/CY

## 2013-11-13 NOTE — Telephone Encounter (Signed)
Continue lasix for now. Does she have labs scheduled.?

## 2013-11-14 NOTE — Telephone Encounter (Signed)
Pt doesn't have labs scheduled. Echo sch for 11/27/13 and f/u OV sch for 12/05/13.

## 2013-11-15 NOTE — Telephone Encounter (Signed)
Ok to check BMet on 1/27

## 2013-11-15 NOTE — Telephone Encounter (Signed)
Pt notified. Sob is some better. Bmet ordered.

## 2013-11-15 NOTE — Addendum Note (Signed)
Addended byOrlene Plum: Scarlette Hogston H on: 11/15/2013 01:05 PM   Modules accepted: Orders

## 2013-11-16 ENCOUNTER — Telehealth: Payer: Self-pay | Admitting: Interventional Cardiology

## 2013-11-16 NOTE — Telephone Encounter (Signed)
New message     Need prior authorization for ins to pay for eliquis-----call customer serv [phone]--[employer] ins

## 2013-11-20 ENCOUNTER — Telehealth: Payer: Self-pay | Admitting: Interventional Cardiology

## 2013-11-20 NOTE — Telephone Encounter (Signed)
Pt notified. Pt hasnt been drinking very much. Pt will try to drink more water. Pt legs are feeling better once she walked more today. To Dr. Eldridge DaceVaranasi.

## 2013-11-20 NOTE — Telephone Encounter (Signed)
Riki RuskJeremy, should pt have Bmet sooner than 11/27/13? Dr. Eldridge DaceVaranasi is out of the office today. Any suggestions?

## 2013-11-20 NOTE — Telephone Encounter (Signed)
Her vitamin D was normal (55.5) last year and potassium was 4.5 on a CMET 4 weeks ago with her PCP. Been on lipitor for years so I don't think the problem is any of these.  Make sure she is drinking plenty of water and staying hydrated. I don't think she needs labs any sooner.

## 2013-11-20 NOTE — Telephone Encounter (Signed)
New message  Patient is having severe leg cramps. Taking is taking Eliqis and Lasix, please call and advise.

## 2013-11-26 ENCOUNTER — Telehealth: Payer: Self-pay | Admitting: *Deleted

## 2013-11-26 NOTE — Telephone Encounter (Signed)
Submitted to [employer] [facility] for PA eliquis

## 2013-11-27 ENCOUNTER — Ambulatory Visit (INDEPENDENT_AMBULATORY_CARE_PROVIDER_SITE_OTHER): Payer: 59 | Admitting: *Deleted

## 2013-11-27 ENCOUNTER — Encounter: Payer: Self-pay | Admitting: Cardiology

## 2013-11-27 ENCOUNTER — Ambulatory Visit (HOSPITAL_COMMUNITY): Payer: Medicare Other | Attending: Interventional Cardiology | Admitting: Cardiology

## 2013-11-27 DIAGNOSIS — I4891 Unspecified atrial fibrillation: Secondary | ICD-10-CM | POA: Insufficient documentation

## 2013-11-27 DIAGNOSIS — Z79899 Other long term (current) drug therapy: Secondary | ICD-10-CM

## 2013-11-27 DIAGNOSIS — Z951 Presence of aortocoronary bypass graft: Secondary | ICD-10-CM | POA: Insufficient documentation

## 2013-11-27 DIAGNOSIS — R0602 Shortness of breath: Secondary | ICD-10-CM

## 2013-11-27 DIAGNOSIS — E785 Hyperlipidemia, unspecified: Secondary | ICD-10-CM | POA: Insufficient documentation

## 2013-11-27 DIAGNOSIS — I2510 Atherosclerotic heart disease of native coronary artery without angina pectoris: Secondary | ICD-10-CM

## 2013-11-27 DIAGNOSIS — R0989 Other specified symptoms and signs involving the circulatory and respiratory systems: Secondary | ICD-10-CM | POA: Insufficient documentation

## 2013-11-27 DIAGNOSIS — I079 Rheumatic tricuspid valve disease, unspecified: Secondary | ICD-10-CM | POA: Insufficient documentation

## 2013-11-27 DIAGNOSIS — R0609 Other forms of dyspnea: Secondary | ICD-10-CM | POA: Insufficient documentation

## 2013-11-27 DIAGNOSIS — I10 Essential (primary) hypertension: Secondary | ICD-10-CM | POA: Insufficient documentation

## 2013-11-27 LAB — BASIC METABOLIC PANEL
BUN: 11 mg/dL (ref 6–23)
CO2: 27 mEq/L (ref 19–32)
Calcium: 9.2 mg/dL (ref 8.4–10.5)
Chloride: 107 mEq/L (ref 96–112)
Creatinine, Ser: 0.9 mg/dL (ref 0.4–1.2)
GFR: 63.28 mL/min (ref >60.00)
Glucose, Bld: 88 mg/dL (ref 70–99)
POTASSIUM: 4.2 meq/L (ref 3.5–5.1)
Sodium: 141 mEq/L (ref 135–145)

## 2013-11-27 NOTE — Progress Notes (Signed)
Echo performed. 

## 2013-11-27 NOTE — Telephone Encounter (Signed)
[employer] approved eliquis through 11/25/2014 with PA [id number]

## 2013-11-28 NOTE — Progress Notes (Signed)
Quick Note:  Preliminary report reviewed by triage nurse and sent to MD desk. ______ 

## 2013-12-05 ENCOUNTER — Ambulatory Visit (INDEPENDENT_AMBULATORY_CARE_PROVIDER_SITE_OTHER): Payer: 59 | Admitting: Interventional Cardiology

## 2013-12-05 ENCOUNTER — Encounter: Payer: Self-pay | Admitting: Interventional Cardiology

## 2013-12-05 VITALS — BP 138/83 | HR 77 | Ht 63.0 in | Wt 119.0 lb

## 2013-12-05 DIAGNOSIS — I4891 Unspecified atrial fibrillation: Secondary | ICD-10-CM

## 2013-12-05 DIAGNOSIS — I2510 Atherosclerotic heart disease of native coronary artery without angina pectoris: Secondary | ICD-10-CM

## 2013-12-05 DIAGNOSIS — E782 Mixed hyperlipidemia: Secondary | ICD-10-CM

## 2013-12-05 DIAGNOSIS — I10 Essential (primary) hypertension: Secondary | ICD-10-CM

## 2013-12-05 NOTE — Progress Notes (Signed)
Patient ID: [name], female   DOB: 04/21/1932, 80 y.o.   MRN: [id number] Patient ID: [name], female   DOB: 01/18/1932, 80 y.o.   MRN: [id number]    [address] [place], [place]  [address] Phone: [phone] Fax:  [phone]) [phone]  Date:  12/05/2013   ID:  [name], DOB 03/14/1932, MRN [id number]  PCP:  Darnelle BosSBORNE,JAMES CHARLES, MD      History of Present Illness: [name] is a 80 y.o. female with CAD who had CABG in 2007. She had hysterectomy in May 2012 and did well. In Jan 2014, she had a fullness in her left chest not related to exertion. There was left chest pain with deep breathing. This has resolved. Left leg cramps at night. No cramps with walking. CAD/ASCVD:  bowling without problems. No sx like what she had before CABG. c/o Shortness of breath when she bends down.  Denies : Diaphoresis.  Dizziness.  Dyspnea on exertion.  Fatigue.  Leg edema.  Nitroglycerin.  Orthopnea.  Palpitations.  Syncope.   She was diagnosed with atrial fibrillation in January 2015. Her metoprolol was increased. She was started on anticoagulation with Eliquis. She has done well since that time. She  had some lightheadedness intermittently yesterday. Other than that, she had been feeling well. She denies palpitations. Overall she feels like she is remaining active and he would complete all her activities. We discussed cardioversion to try to get her back in normal sinus rhythm. She is really not interested in cardioversion at this time.   Wt Readings from Last 3 Encounters:  12/05/13 119 lb (53.978 kg)  11/08/13 121 lb 12.8 oz (55.248 kg)     Past Medical History  Diagnosis Date  . Hypertension   . Coronary artery disease     anginal equivalent=dysnpnea, resolved with CABG, dyspnea has recurred, 2014  . Osteopenia     involved on BMD 06/01/07 (T score-1.6); slightly worse in 3/13 (T score -1.6; FRAX 3%  and 12%)-plan rechcekin 2016  . Iron deficiency anemia   .  Hyperlipidemia   . Hypothyroidism   . Osteoarthritis   . UTI (lower urinary tract infection) 3/11  . Uterine prolapse     post hysterectomy, Dr. Vincente PoliGrewal  . Insomnia     on [place], failed other meds    Current Outpatient Prescriptions  Medication Sig Dispense Refill  . apixaban (ELIQUIS) 5 MG TABS tablet Take 1 tablet (5 mg total) by mouth 2 (two) times daily.  60 tablet  6  . atorvastatin (LIPITOR) 20 MG tablet Take 20 mg by mouth daily.      . Calcium Carbonate-Vitamin D (CALCIUM-D PO) Take by mouth.      . Cholecalciferol (VITAMIN D) 2000 UNITS CAPS Take by mouth daily.      . furosemide (LASIX) 20 MG tablet Take 1 tablet (20 mg total) by mouth daily.  30 tablet  3  . levothyroxine (SYNTHROID, LEVOTHROID) 75 MCG tablet Take 75 mcg by mouth. 1 tablet 6 days a week and a 1/2 tab one day a week      . metoprolol succinate (TOPROL-XL) 50 MG 24 hr tablet 1 tablet by mouth daily  30 tablet  6  . Misc Natural Products (OSTEO BI-FLEX ADV JOINT SHIELD PO) Take by mouth daily.      . nitroGLYCERIN (NITROSTAT) 0.4 MG SL tablet Place 0.4 mg under the tongue every 5 (five) minutes as needed for chest pain.      .[name]  Omega-3 Fatty Acids (FISH OIL) 1000 MG CAPS Take by mouth daily.      [name] omeprazole (PRILOSEC) 20 MG capsule Take 20 mg by mouth daily.      . vitamin B-12 (CYANOCOBALAMIN) 500 MCG tablet Take 500 mcg by mouth daily.      [name] zolpidem (AMBIEN) 5 MG tablet Take 5 mg by mouth at bedtime as needed for sleep.       No current facility-administered medications for this visit.    Allergies:    Allergies  Allergen Reactions  . Sulfa Antibiotics Rash    Social History:  The patient  reports that she has never smoked. She does not have any smokeless tobacco history on file.   Family History:  The patient's family history includes CAD in her father; Cancer in her brother; Diabetes in her brother and father; Heart attack in her father; Heart disease in her mother; Lymphoma in her sister; Syncope  episode in her mother; Ulcers in her mother.   ROS:  Please see the history of present illness.  No nausea, vomiting.  No fevers, chills.  No focal weakness.  No dysuria. Fatigue, SHOB.   All other systems reviewed and negative.   PHYSICAL EXAM: VS:  BP 138/83  Pulse 77  Ht 5\' 3"  (1.6 m)  Wt 119 lb (53.978 kg)  BMI 21.09 kg/m2 Well nourished, well developed, in no acute distress HEENT: normal Neck: no JVD, Right carotid bruit Cardiac:  normal S1, S2; irregular Lungs:  clear to auscultation bilaterally, no wheezing, rhonchi or rales Abd: soft, nontender, no hepatomegaly Ext: no edema Skin: warm and dry Neuro:   no focal abnormalities noted      ASSESSMENT AND PLAN: AFib: Continue Increased Toprol at 50 mg daily.  Echo reviewed and showed normal LV function. She had moderate tricuspid regurgitation and mild mitral regurgitation.[name] BNP mildly elevated. Improved SHOB on Lasix. Continue Eliquis for stroke prevention. Stopped aspirin.  Discussed cardioversion but she would prefer not to ha based on her lack of symptoms. This is reasonable.ve this done Coronary atherosclerosis of native coronary artery  Stopped Aspirin Tablet, 81 mg, 1 tablet, Orally, Once a day with Eliquis. Notes: Occasional chest pressure, may be related to AFib noted at last visit. No further episodes.  Negative stress test in 1/14. 2. Essential hypertension, benign  Continue Metoprolol Succinate Tablet Extended Release 24 Hour, 50 MG, 1 tablet, Orally, Once a day Notes: Well controlled.  3. Mixed hyperlipidemia  Continue Fish Oil Capsule, 1000 MG, 1 tablet, Orally, once a day Continue Lipitor Tablet, 20 MG, 1 tablet, Orally, Once a day Notes: Controlled. LDL 64 in 12/13. LD52 in 12/14  4. Bruit   Notes: right carotid. Negative for significant disease in 5/14.     Signed, Fredric Mare, MD, [facility] 12/05/2013 9:43 AM

## 2013-12-05 NOTE — Patient Instructions (Signed)
Your physician recommends that you continue on your current medications as directed. Please refer to the Current Medication list given to you today.  Your physician wants you to follow-up in: 4 months with Dr. Varanasi. You will receive a reminder letter in the mail two months in advance. If you don't receive a letter, please call our office to schedule the follow-up appointment.  

## 2013-12-07 NOTE — Telephone Encounter (Signed)
Riki RuskJeremy, can pt take biotin?

## 2013-12-07 NOTE — Telephone Encounter (Signed)
New problem    Pt want to know if its ok to take Biotin 10,06100mcg. Please call pt to advise.

## 2013-12-10 MED ORDER — BIOTIN 5000 MCG PO CAPS: ORAL_CAPSULE | ORAL | Status: DC

## 2013-12-10 NOTE — Telephone Encounter (Signed)
Okay to take Biotin, but I would recommend 5,000 mcg daily instead.

## 2013-12-10 NOTE — Telephone Encounter (Signed)
Lmom, med updated.

## 2014-01-18 ENCOUNTER — Telehealth: Payer: Self-pay | Admitting: Interventional Cardiology

## 2014-01-18 NOTE — Telephone Encounter (Signed)
Patient wanting to put something topical on her corn. Discussed with Riki RuskJeremy, pharmacist and anything topical ok. Advised patient

## 2014-01-18 NOTE — Telephone Encounter (Signed)
New message     Pt has a corn on her foot---what can she get for it because the pharmacist told her to contact her doctor since she is on heart medicine

## 2014-02-06 ENCOUNTER — Telehealth: Payer: Self-pay | Admitting: Interventional Cardiology

## 2014-02-06 NOTE — Telephone Encounter (Signed)
Pt notified. Pt will follow up with her pcp Dr. Earl Galasborne.

## 2014-02-06 NOTE — Telephone Encounter (Signed)
Spoke with pt and she is having pain in her left knee and hip. It is a shooting pain that she has had for about 2 weeks. She has taken tylenol to help and she walks around the house, which seems to help (eases the pain). Pt had significant pain this am and then she took some tylenol and layed back down, which seemed to help some. Exercise such as bowling seems to help.

## 2014-02-06 NOTE — Telephone Encounter (Signed)
I do not think this is related to her cardiac medications.

## 2014-02-06 NOTE — Telephone Encounter (Signed)
lmtrc

## 2014-02-06 NOTE — Telephone Encounter (Signed)
Pt wants to know if you think it could be related to one of her medications.

## 2014-02-06 NOTE — Telephone Encounter (Signed)
New message     Pain in left leg.  Could it be from her blood thinner medication?

## 2014-02-19 ENCOUNTER — Ambulatory Visit: Payer: Medicare Other | Admitting: Interventional Cardiology

## 2014-02-22 ENCOUNTER — Other Ambulatory Visit: Payer: Self-pay

## 2014-02-22 MED ORDER — FUROSEMIDE 20 MG PO TABS: 20.0000 mg | ORAL_TABLET | Freq: Every day | ORAL | Status: DC

## 2014-03-07 ENCOUNTER — Encounter: Payer: Self-pay | Admitting: Interventional Cardiology

## 2014-03-07 ENCOUNTER — Ambulatory Visit (INDEPENDENT_AMBULATORY_CARE_PROVIDER_SITE_OTHER): Payer: 59 | Admitting: Interventional Cardiology

## 2014-03-07 VITALS — BP 124/78 | HR 64 | Ht 63.0 in | Wt 116.0 lb

## 2014-03-07 DIAGNOSIS — I4891 Unspecified atrial fibrillation: Secondary | ICD-10-CM

## 2014-03-07 DIAGNOSIS — I10 Essential (primary) hypertension: Secondary | ICD-10-CM

## 2014-03-07 DIAGNOSIS — E782 Mixed hyperlipidemia: Secondary | ICD-10-CM

## 2014-03-07 DIAGNOSIS — I2510 Atherosclerotic heart disease of native coronary artery without angina pectoris: Secondary | ICD-10-CM

## 2014-03-07 MED ORDER — APIXABAN 5 MG PO TABS: 5.0000 mg | ORAL_TABLET | Freq: Two times a day (BID) | ORAL | Status: DC

## 2014-03-07 MED ORDER — FUROSEMIDE 20 MG PO TABS: 20.0000 mg | ORAL_TABLET | Freq: Every day | ORAL | Status: DC

## 2014-03-07 MED ORDER — NITROGLYCERIN 0.4 MG SL SUBL: 0.4000 mg | SUBLINGUAL_TABLET | SUBLINGUAL | Status: DC | PRN

## 2014-03-07 MED ORDER — METOPROLOL SUCCINATE ER 50 MG PO TB24: ORAL_TABLET | ORAL | Status: DC

## 2014-03-07 NOTE — Progress Notes (Signed)
Patient ID: [name], female   DOB: 10/19/1932, 80 y.o.   MRN: [id number]     [address] [place], [place]  [address] Phone: [phone] Fax:  [phone]  Date:  03/07/2014   ID:  [name], DOB 10/15/1932, MRN [id number]  PCP:  Darnelle BosSBORNE,JAMES CHARLES, MD      History of Present Illness: [name] is a 80 y.o. female with CAD who had CABG in 2007. She had hysterectomy in May 2012 and did well. In Jan 2014, she had a fullness in her left chest not related to exertion. There was left chest pain with deep breathing. This has resolved. Left leg cramps at night. No cramps with walking. CAD/ASCVD:  bowling without problems. No sx like what she had before CABG. c/o Shortness of breath when she bends down.  Resolves after a few seconds.  [facility]   Denies : Diaphoresis.  Dizziness.  Dyspnea on exertion.  Fatigue.  Leg edema.  Nitroglycerin.  Orthopnea.  Palpitations.  Syncope.   She was diagnosed with atrial fibrillation in January 2015. Her metoprolol was increased. She was started on anticoagulation with Eliquis. She has done well since that time. She  had some lightheadedness intermittently yesterday. Other than that, she had been feeling well. She denies palpitations. Overall she feels like she is remaining active and he would complete all her activities. We discussed cardioversion to try to get her back in normal sinus rhythm. She declined cardioversion at this time.  She square dances and has no problem.  She wants to go back to the gym.     Wt Readings from Last 3 Encounters:  03/07/14 116 lb (52.617 kg)  12/05/13 119 lb (53.978 kg)  11/08/13 121 lb 12.8 oz (55.248 kg)     Past Medical History  Diagnosis Date  . Hypertension   . Coronary artery disease     anginal equivalent=dysnpnea, resolved with CABG, dyspnea has recurred, 2014  . Osteopenia     involved on BMD 06/01/07 (T score-1.6); slightly worse in  3/13 (T score -1.6; FRAX 3%  and 12%)-plan rechcekin 2016  . Iron deficiency anemia   . Hyperlipidemia   . Hypothyroidism   . Osteoarthritis   . UTI (lower urinary tract infection) 3/11  . Uterine prolapse     post hysterectomy, Dr. Vincente PoliGrewal  . Insomnia     on Rangeramb[place], failed other meds    Current Outpatient Prescriptions  Medication Sig Dispense Refill  . apixaban (ELIQUIS) 5 MG TABS tablet Take 1 tablet (5 mg total) by mouth 2 (two) times daily.  60 tablet  6  . atorvastatin (LIPITOR) 20 MG tablet Take 20 mg by mouth daily.      . Biotin 5000 MCG CAPS 1 capsule by mouth daily.  30 capsule    . Calcium Carbonate-Vitamin D (CALCIUM-D PO) Take by mouth.      . Cholecalciferol (VITAMIN D) 2000 UNITS CAPS Take by mouth daily.      . furosemide (LASIX) 20 MG tablet Take 1 tablet (20 mg total) by mouth daily.  30 tablet  3  . levothyroxine (SYNTHROID, LEVOTHROID) 75 MCG tablet Take 75 mcg by mouth. 1 tablet 6 days a week and a 1/2 tab one day a week      . metoprolol succinate (TOPROL-XL) 50 MG 24 hr tablet 1 tablet by mouth daily  30 tablet  6  . Misc  Natural Products (OSTEO BI-FLEX ADV JOINT SHIELD PO) Take by mouth daily.      . nitroGLYCERIN (NITROSTAT) 0.4 MG SL tablet Place 0.4 mg under the tongue every 5 (five) minutes as needed for chest pain.      . Omega-3 Fatty Acids (FISH OIL) 1000 MG CAPS Take by mouth daily.      [name]. omeprazole (PRILOSEC) 20 MG capsule Take 20 mg by mouth daily.      . vitamin B-12 (CYANOCOBALAMIN) 500 MCG tablet Take 500 mcg by mouth daily.      [name]. zolpidem (AMBIEN) 5 MG tablet Take 5 mg by mouth at bedtime as needed for sleep.       No current facility-administered medications for this visit.    Allergies:    Allergies  Allergen Reactions  . Sulfa Antibiotics Rash    Social History:  The patient  reports that she has never smoked. She does not have any smokeless tobacco history on file.   Family History:  The patient's family history includes CAD in her  father; Cancer in her brother; Diabetes in her brother and father; Heart attack in her father; Heart disease in her mother; Lymphoma in her sister; Syncope episode in her mother; Ulcers in her mother.   ROS:  Please see the history of present illness.  No nausea, vomiting.  No fevers, chills.  No focal weakness.  No dysuria. Fatigue, SHOB.   All other systems reviewed and negative.   PHYSICAL EXAM: VS:  BP 124/78  Pulse 64  Ht 5\' 3"  (1.6 m)  Wt 116 lb (52.617 kg)  BMI 20.55 kg/m2 Well nourished, well developed, in no acute distress HEENT: normal Neck: no JVD, Right carotid bruit Cardiac:  normal S1, S2; irregular Lungs:  clear to auscultation bilaterally, no wheezing, rhonchi or rales Abd: soft, nontender, no hepatomegaly Ext: no edema; bandaid on right shin Skin: warm and dry Neuro:   no focal abnormalities noted      ASSESSMENT AND PLAN: AFib: Continue Increased Toprol at 50 mg daily.  Echo reviewed and showed normal LV function. She had moderate tricuspid regurgitation and mild mitral regurgitation.[name]. BNP mildly elevated. Improved SHOB on Lasix. Continue Eliquis for stroke prevention. Stopped aspirin.  Discussed cardioversion but she would prefer not to have based on her lack of symptoms. This is reasonable.ve this done Coronary atherosclerosis of native coronary artery  Stopped Aspirin Tablet, 81 mg, 1 tablet, Orally, Once a day with Eliquis. Notes: Occasional chest pressure-occurs at random times, not related to exertion. Negative stress test in 1/14.  We discussed cardiac cath but she declines at this time.  She feels that sx are not that bad and that she is able to complete her activities.  No problems with square dancing. If she gets sx with dancing, we will proceed with cath.  2. Essential hypertension, benign  Continue Metoprolol Succinate Tablet Extended Release 24 Hour, 50 MG, 1 tablet, Orally, Once a day Notes: Well controlled.  3. Mixed hyperlipidemia  Continue Fish Oil  Capsule, 1000 MG, 1 tablet, Orally, once a day Continue Lipitor Tablet, 20 MG, 1 tablet, Orally, Once a day Notes: Controlled. LDL 64 in 12/13. LD 52 in 12/14  4. Bruit   Notes: right carotid. Negative for significant disease in 5/14.     Signed, Fredric MareJay S. Sandeep Radell, MD, [facility] 03/07/2014 10:28 AM

## 2014-03-07 NOTE — Patient Instructions (Signed)
Your physician recommends that you continue on your current medications as directed. Please refer to the Current Medication list given to you today.  Your physician recommends that you schedule a follow-up appointment in: 4 months with Dr. Varanasi.  

## 2014-04-05 ENCOUNTER — Ambulatory Visit: Payer: 59 | Admitting: Interventional Cardiology

## 2014-08-01 ENCOUNTER — Ambulatory Visit (INDEPENDENT_AMBULATORY_CARE_PROVIDER_SITE_OTHER): Payer: Medicare Other | Admitting: Interventional Cardiology

## 2014-08-01 ENCOUNTER — Encounter: Payer: Self-pay | Admitting: Interventional Cardiology

## 2014-08-01 VITALS — BP 130/72 | HR 71 | Ht 63.0 in | Wt 116.6 lb

## 2014-08-01 DIAGNOSIS — I481 Persistent atrial fibrillation: Secondary | ICD-10-CM

## 2014-08-01 DIAGNOSIS — I4819 Other persistent atrial fibrillation: Secondary | ICD-10-CM

## 2014-08-01 DIAGNOSIS — I2510 Atherosclerotic heart disease of native coronary artery without angina pectoris: Secondary | ICD-10-CM

## 2014-08-01 NOTE — Progress Notes (Signed)
Patient ID: [name], female   DOB: 09-19-1932, 80 y.o.   MRN: [id number] Patient ID: [name], female   DOB: April 30, 1932, 80 y.o.   MRN: [id number]     [address] [place], [place]  [address] Phone: [phone] Fax:  [phone]  Date:  08/01/2014   ID:  [name], DOB Mar 28, 1932, MRN [id number]  PCP:  Darnelle Bos, MD      History of Present Illness: [name] is a 80 y.o. female with CAD who had CABG in 2007. She had hysterectomy in May 2012 and did well. In Jan 2014, she had a fullness in her left chest not related to exertion. There was left chest pain with deep breathing. This has resolved. Left leg cramps at night. No cramps with walking. CAD/ASCVD:  bowling without problems. No sx like what she had before CABG. c/o Shortness of breath when she bends down.  Resolves after a few seconds.  At time of CABG, she had [facility] with any activity.   Denies : Diaphoresis.  Dizziness.  Dyspnea on exertion.  Fatigue.  Leg edema.  Nitroglycerin.  Orthopnea.  Palpitations.  Syncope.   She was diagnosed with atrial fibrillation in January 2015. Her metoprolol was increased. She was started on anticoagulation with Eliquis. She has done well since that time. She  had some lightheadedness intermittently yesterday. Other than that, she had been feeling well. She denies palpitations. Overall she feels like she is remaining active and he would complete all her activities. [name]. She declined cardioversion at that time.  Mild DOE-stable to better.  Restarted bowling without problems. Still declining cardioversion.   She square dances and has no problem.    Wt Readings from Last 3 Encounters:  08/01/14 116 lb 9.6 oz (52.889 kg)  03/07/14 116 lb (52.617 kg)  12/05/13 119 lb (53.978 kg)     Past Medical History  Diagnosis Date  . Hypertension   . Coronary artery disease     anginal  equivalent=dysnpnea, resolved with CABG, dyspnea has recurred, 2014  . Osteopenia     involved on BMD 06/01/07 (T score-1.6); slightly worse in 3/13 (T score -1.6; FRAX 3%  and 12%)-plan rechcekin 2016  . Iron deficiency anemia   . Hyperlipidemia   . Hypothyroidism   . Osteoarthritis   . UTI (lower urinary tract infection) 3/11  . Uterine prolapse     post hysterectomy, Dr. Vincente Poli  . Insomnia     on [place], failed other meds    Current Outpatient Prescriptions  Medication Sig Dispense Refill  . acetaminophen (TYLENOL) 325 MG tablet Take 650 mg by mouth every 6 (six) hours as needed.      [name] apixaban (ELIQUIS) 5 MG TABS tablet Take 1 tablet (5 mg total) by mouth 2 (two) times daily.  180 tablet  3  . atorvastatin (LIPITOR) 20 MG tablet Take 20 mg by mouth daily.      . Biotin 5000 MCG CAPS 1 capsule by mouth daily.  30 capsule    . Calcium Carbonate-Vitamin D (CALCIUM-D PO) Take by mouth.      . Cholecalciferol (VITAMIN D) 2000 UNITS CAPS Take by mouth daily.      . furosemide (LASIX) 20 MG tablet Take 1 tablet (20 mg total) by mouth daily.  90 tablet  3  . levothyroxine (SYNTHROID, LEVOTHROID) 75 MCG tablet Take 75 mcg by  mouth. 1 tablet 6 days a week and a 1/2 tab one day a week      . metoprolol succinate (TOPROL-XL) 50 MG 24 hr tablet 1 tablet by mouth daily  90 tablet  3  . Misc Natural Products (OSTEO BI-FLEX ADV JOINT SHIELD PO) Take by mouth daily.      . nitroGLYCERIN (NITROSTAT) 0.4 MG SL tablet Place 1 tablet (0.4 mg total) under the tongue every 5 (five) minutes as needed for chest pain.  25 tablet  5  . Omega-3 Fatty Acids (FISH OIL) 1000 MG CAPS Take by mouth daily.      [name] omeprazole (PRILOSEC) 20 MG capsule Take 20 mg by mouth daily.      . vitamin B-12 (CYANOCOBALAMIN) 500 MCG tablet Take 500 mcg by mouth daily.      [name] zolpidem (AMBIEN) 5 MG tablet Take 5 mg by mouth at bedtime as needed for sleep.       No current facility-administered medications for this visit.     Allergies:    Allergies  Allergen Reactions  . Sulfa Antibiotics Rash    Social History:  The patient  reports that she has never smoked. She does not have any smokeless tobacco history on file.   Family History:  The patient's family history includes CAD in her father; Cancer in her brother; Diabetes in her brother and father; Heart attack in her father; Heart disease in her mother; Lymphoma in her sister; Syncope episode in her mother; Ulcers in her mother.   ROS:  Please see the history of present illness.  No nausea, vomiting.  No fevers, chills.  No focal weakness.  No dysuria. Fatigue, SHOB.   All other systems reviewed and negative.   PHYSICAL EXAM: VS:  BP 130/72  Pulse 71  Ht 5\' 3"  (1.6 m)  Wt 116 lb 9.6 oz (52.889 kg)  BMI 20.66 kg/m2 Well nourished, well developed, in no acute distress HEENT: normal Neck: no JVD, Right carotid bruit Cardiac:  normal S1, S2; irregular Lungs:  clear to auscultation bilaterally, no wheezing, rhonchi or rales Abd: soft, nontender, no hepatomegaly Ext: no edema; bandaid on right shin Skin: warm and dry Neuro:   no focal abnormalities noted      ASSESSMENT AND PLAN: AFib: Continue Increased Toprol at 50 mg daily.  Echo reviewed and showed normal LV function. She had moderate tricuspid regurgitation and mild mitral regurgitation.[name] BNP mildly elevated. Improved SHOB on Lasix. Continue Eliquis for stroke prevention. Stopped aspirin.  Discussed cardioversion but she would prefer not to have based on her lack of symptoms. This is reasonable.ve this done Coronary atherosclerosis of native coronary artery  Stopped Aspirin Tablet, 81 mg, 1 tablet, Orally, Once a day with Eliquis. Notes: Occasional chest pressure-occurs at random times, not related to exertion. Negative stress test in 1/14.  Marisa discussed cardiac cath but she declines at this time.  She feels that sx are not that bad and that she is able to complete her activities.  No problems  with square dancing. If she gets sx with dancing, Marisa will proceed with cath.  2. Essential hypertension, benign  Continue Metoprolol Succinate Tablet Extended Release 24 Hour, 50 MG, 1 tablet, Orally, Once a day Notes: Well controlled.  3. Mixed hyperlipidemia  Continue Fish Oil Capsule, 1000 MG, 1 tablet, Orally, once a day Continue Lipitor Tablet, 20 MG, 1 tablet, Orally, Once a day Notes: Controlled. LDL 64 in 12/13. LD 52 in 12/14  4. Bruit  Notes: right carotid. Negative for significant disease in 5/14.     Signed, Fredric MareJay S. Varanasi, MD, [facility] 08/01/2014 10:39 AM

## 2014-08-01 NOTE — Patient Instructions (Signed)
Your physician recommends that you continue on your current medications as directed. Please refer to the Current Medication list given to you today.  Your physician wants you to follow-up in: 6 months with Dr. Varanasi.  You will receive a reminder letter in the mail two months in advance. If you don't receive a letter, please call our office to schedule the follow-up appointment.  

## 2015-01-23 ENCOUNTER — Encounter: Payer: Self-pay | Admitting: Interventional Cardiology

## 2015-01-23 ENCOUNTER — Ambulatory Visit (INDEPENDENT_AMBULATORY_CARE_PROVIDER_SITE_OTHER): Payer: Self-pay | Admitting: Interventional Cardiology

## 2015-01-23 VITALS — BP 148/82 | HR 67 | Ht 63.0 in | Wt 117.0 lb

## 2015-01-23 DIAGNOSIS — E782 Mixed hyperlipidemia: Secondary | ICD-10-CM

## 2015-01-23 DIAGNOSIS — I2510 Atherosclerotic heart disease of native coronary artery without angina pectoris: Secondary | ICD-10-CM

## 2015-01-23 DIAGNOSIS — I4891 Unspecified atrial fibrillation: Secondary | ICD-10-CM

## 2015-01-23 DIAGNOSIS — I10 Essential (primary) hypertension: Secondary | ICD-10-CM

## 2015-01-23 MED ORDER — METOPROLOL SUCCINATE ER 50 MG PO TB24: ORAL_TABLET | ORAL | Status: DC

## 2015-01-23 MED ORDER — APIXABAN 5 MG PO TABS: 5.0000 mg | ORAL_TABLET | Freq: Two times a day (BID) | ORAL | Status: DC

## 2015-01-23 MED ORDER — ATORVASTATIN CALCIUM 20 MG PO TABS: 20.0000 mg | ORAL_TABLET | Freq: Every day | ORAL | Status: DC

## 2015-01-23 MED ORDER — FUROSEMIDE 20 MG PO TABS: 20.0000 mg | ORAL_TABLET | Freq: Every day | ORAL | Status: DC

## 2015-01-23 NOTE — Progress Notes (Signed)
Patient ID: [name], female   DOB: 1931-12-21, 81 y.o.   MRN: [id number]     Cardiology Office Note   Date:  01/23/2015   ID:  [name], DOB 1932/05/15, MRN [id number]  PCP:  Clelia Schaumann, MD  Cardiologist:   Corky Crafts., MD   No chief complaint on file.     History of Present Illness: [name] is a 81 y.o. female who had CABG in 2007 for left main diseaes, by Dr. Dorris Fetch. She had hysterectomy in May 2012 and did well.  Left leg cramps at night. No cramps with walking. CAD/ASCVD:  bowling without problems. No sx like what she had before CABG. c/o Shortness of breath when she bends down. Resolves after a few seconds. At time of CABG, she had [facility] with any activity.  Denies : Diaphoresis.  Dizziness.   Fatigue.  Leg edema.  Nitroglycerin.  Orthopnea.  Palpitations.  Syncope.   She was diagnosed with atrial fibrillation in January 2015. Her metoprolol was increased. She was started on anticoagulation with Eliquis. She has done well since that time.  No more lightheadedness.  She declined cardioversion. Mild DOE-stable to better- usually at the start of an activity, but gets better as she continues to do the activity.  SHe does not have to stop . Still bowling once a week without problems. Still declining cardioversion since she is not feeling palpitations.   She square dances and has no problem after the first dance.  She dances for 2 hours.  Initial SHOB will resolve after one dance.   Occasional sharp left chest pain under the left breast that lasts a few minutes.  It gets better when she moves around and is worse at rest.      Past Medical History  Diagnosis Date  . Hypertension   . Coronary artery disease     anginal equivalent=dysnpnea, resolved with CABG, dyspnea has recurred, 2014  . Osteopenia     involved on BMD 06/01/07 (T score-1.6); slightly worse in 3/13 (T score -1.6; FRAX 3%  and 12%)-plan rechcekin 2016  . Iron  deficiency anemia   . Hyperlipidemia   . Hypothyroidism   . Osteoarthritis   . UTI (lower urinary tract infection) 3/11  . Uterine prolapse     post hysterectomy, Dr. Vincente Poli  . Insomnia     on [place], failed other meds    Past Surgical History  Procedure Laterality Date  . Coronary artery bypass graft  2007  . Appendectomy    . Rotator cuff repair    . Ovarian cyst removal    . Lipoma excision    . Tear duct probing      X2 on left and x1 on right  . Cataract extraction, bilateral  2009     Current Outpatient Prescriptions  Medication Sig Dispense Refill  . acetaminophen (TYLENOL) 325 MG tablet Take 650 mg by mouth every 6 (six) hours as needed.    [name] apixaban (ELIQUIS) 5 MG TABS tablet Take 1 tablet (5 mg total) by mouth 2 (two) times daily. 180 tablet 3  . atorvastatin (LIPITOR) 20 MG tablet Take 1 tablet (20 mg total) by mouth daily. 90 tablet 3  . Biotin 5000 MCG CAPS 1 capsule by mouth daily. 30 capsule   . Calcium Carbonate-Vitamin D (CALCIUM-D PO) Take by mouth.    . Cholecalciferol (VITAMIN D) 2000 UNITS CAPS Take by mouth daily.    . furosemide (LASIX) 20 MG tablet  Take 1 tablet (20 mg total) by mouth daily. 90 tablet 3  . levothyroxine (SYNTHROID, LEVOTHROID) 75 MCG tablet Take 75 mcg by mouth. 1 tablet 6 days a week and a 1/2 tab one day a week    . metoprolol succinate (TOPROL-XL) 50 MG 24 hr tablet 1 tablet by mouth daily 90 tablet 3  . Misc Natural Products (OSTEO BI-FLEX ADV JOINT SHIELD PO) Take by mouth daily.    . nitroGLYCERIN (NITROSTAT) 0.4 MG SL tablet Place 1 tablet (0.4 mg total) under the tongue every 5 (five) minutes as needed for chest pain. 25 tablet 5  . Omega-3 Fatty Acids (FISH OIL) 1000 MG CAPS Take by mouth daily.    [name] omeprazole (PRILOSEC) 20 MG capsule Take 20 mg by mouth daily.    . vitamin B-12 (CYANOCOBALAMIN) 500 MCG tablet Take 500 mcg by mouth daily.    [name] zolpidem (AMBIEN) 5 MG tablet Take 5 mg by mouth at bedtime as needed for sleep.       No current facility-administered medications for this visit.    Allergies:   Sulfa antibiotics    Social History:  The patient  reports that she has never smoked. She has never used smokeless tobacco.   Family History:  The patient's family history includes CAD in her father; Cancer in her brother; Diabetes in her brother and father; Heart attack in her father; Heart disease in her mother; Lymphoma in her sister; Syncope episode in her mother; Ulcers in her mother.    ROS:  Please see the history of present illness.   Otherwise, review of systems are positive for the [facility] and chest pressure described above. No bleding problems.  Easy bruising.  All other systems are reviewed and negative.    PHYSICAL EXAM: VS:  BP 148/82 mmHg  Pulse 67  Ht  (1.6 m)  Wt 117 lb (53.071 kg)  BMI 20.73 kg/m2  SpO2 98% , BMI Body mass index is 20.73 kg/(m^2). GEN: Well nourished, well developed, in no acute distress HEENT: normal Neck: no JVD, carotid bruits, or masses Cardiac: RRR; no murmurs, rubs, or gallops,no edema  Respiratory:  clear to auscultation bilaterally, normal work of breathing GI: soft, nontender, nondistended, + BS MS: no deformity or atrophy Skin: warm and dry, no rash Neuro:  Strength and sensation are intact Psych: euthymic mood, full affect   EKG:  EKG is ordered today. The ekg ordered today demonstrates AFib, rate controlled   Recent Labs: No results found for requested labs within last 365 days.    Lipid Panel No results found for: CHOL, TRIG, HDL, CHOLHDL, VLDL, LDLCALC, LDLDIRECT    Wt Readings from Last 3 Encounters:  01/23/15 117 lb (53.071 kg)  08/01/14 116 lb 9.6 oz (52.889 kg)  03/07/14 116 lb (52.617 kg)      Other studies Reviewed: Additional studies/ records that were reviewed today include: none    ASSESSMENT AND PLAN:  1.  AFib: Rate controlled Continue Increased Toprol at 50 mg daily. Echo showed normal LV function. She had moderate  tricuspid regurgitation and mild mitral regurgitation.[name] BNP mildly elevated in the past. Improved SHOB on Lasix. Continue Eliquis for stroke prevention. Stopped aspirin. Discussed cardioversion but she would prefer not to have based on her lack of symptoms. This is reasonable. Coronary atherosclerosis of native coronary artery  Stopped Aspirin Tablet, 81 mg, 1 tablet, Orally, Once a day with Eliquis. Notes: Occasional chest pressure-occurs at random times, not related to exertion. Negative  stress test in 1/14. We discussed cardiac cath but she declined in the past. She feels that sx are not that bad and that she is able to complete her activities. No problems with square dancing after the first dance.   2. Essential hypertension, benign  Continue Metoprolol Succinate Tablet Extended Release 24 Hour, 50 MG, 1 tablet, Orally, Once a day Notes: Well controlled.  3. Mixed hyperlipidemia  Continue Fish Oil Capsule, 1000 MG, 1 tablet, Orally, once a day Continue Lipitor Tablet, 20 MG, 1 tablet, Orally, Once a day Notes: Controlled. LDL 64 in 12/13. LD 52 in 12/14  4. Bruit   Notes: right carotid. Negative for significant disease in 5/14.   Current medicines are reviewed at length with the patient today.  The patient does not have concerns regarding medicines.  The following changes have been made:  no change  Labs/ tests ordered today include:   Orders Placed This Encounter  Procedures  . EKG 12-Lead     Disposition:   FU in 6 months with me   Delorise JacksonSigned, Zali Kamaka S., MD  01/23/2015 11:52 AM    [facility] [address], [place], [place]  [address] Phone: [phone](336) [phone]; Fax: [phone]([phone]

## 2015-01-23 NOTE — Patient Instructions (Signed)
Your physician recommends that you continue on your current medications as directed. Please refer to the Current Medication list given to you today.  Your physician wants you to follow-up in: 6 month follow up with Dr. Varanasi. You will receive a reminder letter in the mail two months in advance. If you don't receive a letter, please call our office to schedule the follow-up appointment.  

## 2015-10-29 ENCOUNTER — Other Ambulatory Visit: Payer: Self-pay | Admitting: Internal Medicine

## 2015-10-29 ENCOUNTER — Encounter: Payer: Self-pay | Admitting: Physician Assistant

## 2015-10-29 ENCOUNTER — Ambulatory Visit
Admission: RE | Admit: 2015-10-29 | Discharge: 2015-10-29 | Disposition: A | Discharge status: Home / Self Care | Payer: Medicare Other | Source: Ambulatory Visit | Attending: Internal Medicine | Admitting: Internal Medicine

## 2015-10-29 DIAGNOSIS — R0602 Shortness of breath: Secondary | ICD-10-CM

## 2015-10-30 ENCOUNTER — Other Ambulatory Visit: Payer: Self-pay | Admitting: Internal Medicine

## 2015-10-30 DIAGNOSIS — R9389 Abnormal findings on diagnostic imaging of other specified body structures: Secondary | ICD-10-CM

## 2015-10-31 ENCOUNTER — Ambulatory Visit
Admission: RE | Admit: 2015-10-31 | Discharge: 2015-10-31 | Disposition: A | Discharge status: Home / Self Care | Payer: Medicare Other | Source: Ambulatory Visit | Attending: Internal Medicine | Admitting: Internal Medicine

## 2015-10-31 DIAGNOSIS — R9389 Abnormal findings on diagnostic imaging of other specified body structures: Secondary | ICD-10-CM

## 2015-11-10 ENCOUNTER — Encounter: Payer: Self-pay | Admitting: Physician Assistant

## 2015-11-10 DIAGNOSIS — E785 Hyperlipidemia, unspecified: Secondary | ICD-10-CM | POA: Insufficient documentation

## 2015-11-10 DIAGNOSIS — I482 Chronic atrial fibrillation, unspecified: Secondary | ICD-10-CM | POA: Insufficient documentation

## 2015-11-10 DIAGNOSIS — E039 Hypothyroidism, unspecified: Secondary | ICD-10-CM | POA: Insufficient documentation

## 2015-11-10 DIAGNOSIS — I10 Essential (primary) hypertension: Secondary | ICD-10-CM | POA: Insufficient documentation

## 2015-11-10 NOTE — Progress Notes (Addendum)
Cardiology Office Note Date:  11/11/2015  Patient ID:  [name], DOB 05/23/1932, MRN [id number] PCP:  Tommy RainwaterShamleffer, Ibethal JARALLA, MD  Cardiologist:  Dr. Eldridge DaceVaranasi  Chief Complaint: shortness of breath  History of Present Illness: [name] is a 82 y.o. female with history of CAD s/p CABGx2 06/2006 (LIMA-LAD, SVG-LCx at bifurcation of OM II/III), chronic afib (patient declined DCCV), essential HTN, HLD, iron deficiency anemia who presents for evaluation of worsening SOB. Prior Nuc 11/2012: no evidence of ischemia, attenuation artifact c/w breast attenuation in anterior region, essentially low risk, EF 88%. Last echo 11/2013: EF 55-60%, no RWMA, mild MR, severely dilated RA, mod TR, mildly dilated LA, PASP 38mmHg.  She comes in today primarily for evaluation of SOB. She reports for the last month she has noticed a sensation like something stuck inside her chest. Whenever she bends over she feels like this cuts off her air. She has been able to continue bowling without SOB, but at her last square dancing session in December, she had to stop early due to dyspnea. She has not been back since that time. She denies overt chest pain, just that strange sensation in her chest. She gets occasional nausea/bad taste in her mouth after she eats. She reports her PCP did a CT scan showing fluid outside her lungs and scarring and put her on antibiotics. She's noticed a little relief in dyspnea since then but not back to baseline. No bleeding, palpitations, syncope, LEE, weight changes, orthopnea, cough. Initial BP 150/80 but she was stressed on the drive in due to the winter weather. F/u BP 132/67. She says her BP usually runs normal at home, 120s/70s.   Past Medical History  Diagnosis Date  . Essential hypertension   . Coronary artery disease     a. s/p CABGx2 06/2006 (LIMA-LAD, SVG-LCx at bifurcation of OM II/III). b. Nuc 11/2012: no evidence of ischemia, attenuation artifact c/w breast attenuation in  anterior region, essentially low risk, EF 88%.  . Osteopenia     involved on BMD 06/01/07 (T score-1.6); slightly worse in 3/13 (T score -1.6; FRAX 3%  and 12%)-plan rechcekin 2016  . Iron deficiency anemia   . Hyperlipidemia   . Hypothyroidism   . Osteoarthritis   . UTI (lower urinary tract infection) 3/11  . Uterine prolapse     post hysterectomy, Dr. Vincente PoliGrewal  . Insomnia     on Aultambien, failed other meds  . Chronic atrial fibrillation (HCC)     a. Pt declined cardioversion.    Past Surgical History  Procedure Laterality Date  . Coronary artery bypass graft  2007  . Appendectomy    . Rotator cuff repair    . Ovarian cyst removal    . Lipoma excision    . Tear duct probing      X2 on left and x1 on right  . Cataract extraction, bilateral  2009    Current Outpatient Prescriptions  Medication Sig Dispense Refill  . acetaminophen (TYLENOL) 325 MG tablet Take 650 mg by mouth every 6 (six) hours as needed for mild pain.     [name]. apixaban (ELIQUIS) 5 MG TABS tablet Take 1 tablet (5 mg total) by mouth 2 (two) times daily. 180 tablet 3  . atorvastatin (LIPITOR) 20 MG tablet Take 1 tablet (20 mg total) by mouth daily. 90 tablet 3  . Biotin 5000 MCG CAPS 1 capsule by mouth daily. 30 capsule   . Calcium Carbonate-Vitamin D (CALCIUM-D PO) Take 1 tablet  by mouth daily.     . Cholecalciferol (VITAMIN D) 2000 UNITS CAPS Take 2,000 Units by mouth daily.     . furosemide (LASIX) 20 MG tablet Take 1 tablet (20 mg total) by mouth daily. 90 tablet 3  . levothyroxine (SYNTHROID, LEVOTHROID) 75 MCG tablet Take 75 mcg by mouth. 1 tablet 6 days a week and a 1/2 tab one day a week    . metoprolol succinate (TOPROL-XL) 50 MG 24 hr tablet 1 tablet by mouth daily 90 tablet 3  . Misc Natural Products (OSTEO BI-FLEX ADV JOINT SHIELD PO) Take 1 tablet by mouth 2 (two) times daily.     . nitroGLYCERIN (NITROSTAT) 0.4 MG SL tablet Place 1 tablet (0.4 mg total) under the tongue every 5 (five) minutes as needed for  chest pain. 25 tablet 5  . Omega-3 Fatty Acids (FISH OIL) 1000 MG CAPS Take 1 capsule by mouth 2 (two) times daily.     [name] omeprazole (PRILOSEC) 20 MG capsule Take 20 mg by mouth daily.    . vitamin B-12 (CYANOCOBALAMIN) 500 MCG tablet Take 500 mcg by mouth daily.    [name] zolpidem (AMBIEN) 5 MG tablet Take 5 mg by mouth at bedtime as needed for sleep.     No current facility-administered medications for this visit.    Allergies:   Sulfa antibiotics   Social History:  The patient  reports that she has never smoked. She has never used smokeless tobacco.   Family History:  The patient's family history includes CAD in her father; Cancer in her brother; Diabetes in her brother and father; Heart attack in her father; Heart disease in her mother; Lymphoma in her sister; Syncope episode in her mother; Ulcers in her mother.  ROS:  Please see the history of present illness.  All other systems are reviewed and otherwise negative.   PHYSICAL EXAM:  VS:  BP 132/67 mmHg  Pulse 69  Ht 5\' 2"  (1.575 m)  Wt 117 lb 2.2 oz (53.134 kg)  BMI 21.42 kg/m2  SpO2 98% BMI: Body mass index is 21.42 kg/(m^2). Well nourished, well developed WF in no acute distress HEENT: normocephalic, atraumatic Neck: no JVD, carotid bruits or masses Cardiac:  Irregularly irregular, no murmurs, rubs, or gallops Lungs:  clear to auscultation bilaterally, no wheezing, rhonchi or rales Abd: soft, nontender, no hepatomegaly, + BS MS: no deformity or atrophy Ext: no edema Skin: warm and dry, no rash Neuro:  moves all extremities spontaneously, no focal abnormalities noted, follows commands Psych: euthymic mood, full affect   EKG:  Done today shows atrial fib 69bpm, nonspecific T wave flattening inferiorly. No sig change from 12/2014.  Recent Labs: No results found for requested labs within last 365 days.  No results found for requested labs within last 365 days.   CrCl cannot be calculated (Patient has no serum creatinine  result on file.).   Wt Readings from Last 3 Encounters:  11/11/15 117 lb 2.2 oz (53.134 kg)  01/23/15 117 lb (53.071 kg)  08/01/14 116 lb 9.6 oz (52.889 kg)     Other studies reviewed: Additional studies/records reviewed today include: summarized above  ASSESSMENT AND PLAN:  1. Shortness of breath - symptoms somewhat atypical, but warrant further workup given her cardiac history. Check basic labs including BMET, CBC, TSH, BNP. Will see if we can obtain copy of the CT scan report for review. Will proceed with 2D echo to evaluate LV function/diastolic parameters and Lexiscan nuclear stress test to exclude progression  of CAD. The patient wishes to avoid cath if possible so will start with noninvasive testing.  2. Chronic atrial fibrillation - not clear that this is contributing to her dyspnea as she has historically been asymptomatic in this rhythm. Check CBC to exclude anemia since she is on Eliquis. Not tachypneic, tachycardic or hypoxic. 3. CAD as above - continue statin, beta blocker. Not on ASA due to being on Eliquis. Stress test as above. 4. Essential HTN - initial BP mildly elevated but f/u BP normal. I asked her to follow periodically at home and call if she is seeing any routine higher readings. 5. H/o anemia - recheck CBC to r/o contribution to symptoms.   Addendum: labs and CT reviewed from PCP. WBC 5.1, Hgb 10.7, Hct 32.9, Plt 104. Na 144, K 3.9, Cl 108, Co2 27, BUN 14, Cr 0.79, glucose 88, Tbili 0.9, ALP 57, AST 23, ALT 18, Tchol 106, trig 73, HDL 37, LDL 44. BNP 162. TIBC 427, iron 50, % sat 12% (low), transferrin 305. CXR 10/29/15: prior CABG, stable cardiomegaly, no pulm venous congestion, small peripheral right base atelectasis and infiltrate cannot be excluded, small right pleural effusion. CT Chest without contrast 10/31/15: small right pleural effusion and minimal left pleural effusion, mild atx right lung base, mild bibasilar linear scarring, atheromatous coronary calcification  and post CABG changes.   Disposition: F/u with 1 month with APP.   Current medicines are reviewed at length with the patient today.  The patient did not have any concerns regarding medicines.  Thomasene Mohair PA-C 11/11/2015 9:05 AM     [facility] [address] [place] [address] [phone] (office)  [phone] (fax)

## 2015-11-11 ENCOUNTER — Encounter: Payer: Self-pay | Admitting: *Deleted

## 2015-11-11 ENCOUNTER — Ambulatory Visit (INDEPENDENT_AMBULATORY_CARE_PROVIDER_SITE_OTHER): Payer: Medicare Other | Admitting: Physician Assistant

## 2015-11-11 ENCOUNTER — Encounter: Payer: Self-pay | Admitting: Physician Assistant

## 2015-11-11 VITALS — BP 132/67 | HR 69 | Ht 62.0 in | Wt 117.4 lb

## 2015-11-11 DIAGNOSIS — Z862 Personal history of diseases of the blood and blood-forming organs and certain disorders involving the immune mechanism: Secondary | ICD-10-CM

## 2015-11-11 DIAGNOSIS — I482 Chronic atrial fibrillation, unspecified: Secondary | ICD-10-CM

## 2015-11-11 DIAGNOSIS — I10 Essential (primary) hypertension: Secondary | ICD-10-CM | POA: Diagnosis not present

## 2015-11-11 DIAGNOSIS — I2510 Atherosclerotic heart disease of native coronary artery without angina pectoris: Secondary | ICD-10-CM

## 2015-11-11 DIAGNOSIS — R0602 Shortness of breath: Secondary | ICD-10-CM | POA: Diagnosis not present

## 2015-11-11 LAB — CBC WITH DIFFERENTIAL/PLATELET
BASOS ABS: 0 10*3/uL (ref 0.0–0.1)
Basophils Relative: 0 % (ref 0–1)
EOS ABS: 0.2 10*3/uL (ref 0.0–0.7)
EOS PCT: 3 % (ref 0–5)
HCT: 34.4 % — ABNORMAL LOW (ref 36.0–46.0)
Hemoglobin: 11 g/dL — ABNORMAL LOW (ref 12.0–15.0)
LYMPHS ABS: 1.9 10*3/uL (ref 0.7–4.0)
Lymphocytes Relative: 25 % (ref 12–46)
MCH: 26.8 pg (ref 26.0–34.0)
MCHC: 32 g/dL (ref 30.0–36.0)
MCV: 83.9 fL (ref 78.0–100.0)
MONO ABS: 0.8 10*3/uL (ref 0.1–1.0)
Monocytes Relative: 11 % (ref 3–12)
Neutro Abs: 4.5 10*3/uL (ref 1.7–7.7)
Neutrophils Relative %: 61 % (ref 43–77)
RBC: 4.1 MIL/uL (ref 3.87–5.11)
RDW: 14.4 % (ref 11.5–15.5)
WBC: 7.4 10*3/uL (ref 4.0–10.5)

## 2015-11-11 LAB — BASIC METABOLIC PANEL
BUN: 11 mg/dL (ref 7–25)
CALCIUM: 9.2 mg/dL (ref 8.6–10.4)
CO2: 23 mmol/L (ref 20–31)
Chloride: 106 mmol/L (ref 98–110)
Creat: 0.71 mg/dL (ref 0.60–0.88)
Glucose, Bld: 88 mg/dL (ref 65–99)
Potassium: 4 mmol/L (ref 3.5–5.3)
SODIUM: 140 mmol/L (ref 135–146)

## 2015-11-11 LAB — TSH: TSH: 2.339 u[IU]/mL (ref 0.350–4.500)

## 2015-11-11 NOTE — Patient Instructions (Signed)
Medication Instructions:  Your physician recommends that you continue on your current medications as directed. Please refer to the Current Medication list given to you today.   Labwork: TODAY:  BMET                BNP                TSH                CBC W/DIFF   Testing/Procedures: Your physician has requested that you have an echocardiogram. Echocardiography is a painless test that uses sound waves to create images of your heart. It provides your doctor with information about the size and shape of your heart and how well your heart's chambers and valves are working. This procedure takes approximately one hour. There are no restrictions for this procedure.   Your physician has requested that you have a lexiscan myoview. For further information please visit [email or web address]. Please follow instruction sheet, as given.   Follow-Up: Your physician recommends that you schedule a follow-up appointment in: 1 MONTH WITH 1ST AVAILABLE EXTENDER   Any Other Special Instructions Will Be Listed Below (If Applicable).  Echocardiogram An echocardiogram, or echocardiography, uses sound waves (ultrasound) to produce an image of your heart. The echocardiogram is simple, painless, obtained within a short period of time, and offers valuable information to your health care provider. The images from an echocardiogram can provide information such as:  Evidence of coronary artery disease (CAD).  Heart size.  Heart muscle function.  Heart valve function.  Aneurysm detection.  Evidence of a past heart attack.  Fluid buildup around the heart.  Heart muscle thickening.  Assess heart valve function. LET [facility] KNOW ABOUT:  Any allergies you have.  All medicines you are taking, including vitamins, herbs, eye drops, creams, and over-the-counter medicines.  Previous problems you or members of your family have had with the use of anesthetics.  Any blood disorders you  have.  Previous surgeries you have had.  Medical conditions you have.  Possibility of pregnancy, if this applies. BEFORE THE PROCEDURE  No special preparation is needed. Eat and drink normally.  PROCEDURE   In order to produce an image of your heart, gel will be applied to your chest and a wand-like tool (transducer) will be moved over your chest. The gel will help transmit the sound waves from the transducer. The sound waves will harmlessly bounce off your heart to allow the heart images to be captured in real-time motion. These images will then be recorded.  You may need an IV to receive a medicine that improves the quality of the pictures. AFTER THE PROCEDURE You may return to your normal schedule including diet, activities, and medicines, unless your health care provider tells you otherwise.   This information is not intended to replace advice given to you by your health care provider. Make sure you discuss any questions you have with your health care provider.   Document Released: 10/15/2000 Document Revised: 11/08/2014 Document Reviewed: 06/25/2013 Elsevier Interactive Patient Education 2016 Elsevier Inc.   Pharmacologic Stress Electrocardiogram A pharmacologic stress electrocardiogram is a heart (cardiac) test that uses nuclear imaging to evaluate the blood supply to your heart. This test may also be called a pharmacologic stress electrocardiography. Pharmacologic means that a medicine is used to increase your heart rate and blood pressure.  This stress test is done to find areas of poor blood flow to the heart by determining the extent  of coronary artery disease (CAD). Some people exercise on a treadmill, which naturally increases the blood flow to the heart. For those people unable to exercise on a treadmill, a medicine is used. This medicine stimulates your heart and will cause your heart to beat harder and more quickly, as if you were exercising.  Pharmacologic stress tests can  help determine:  The adequacy of blood flow to your heart during increased levels of activity in order to clear you for discharge home.  The extent of coronary artery blockage caused by CAD.  Your prognosis if you have suffered a heart attack.  The effectiveness of cardiac procedures done, such as an angioplasty, which can increase the circulation in your coronary arteries.  Causes of chest pain or pressure. LET [facility] CARE PROVIDER KNOW ABOUT:  Any allergies you have.  All medicines you are taking, including vitamins, herbs, eye drops, creams, and over-the-counter medicines.  Previous problems you or members of your family have had with the use of anesthetics.  Any blood disorders you have.  Previous surgeries you have had.  Medical conditions you have.  Possibility of pregnancy, if this applies.  If you are currently breastfeeding. RISKS AND COMPLICATIONS Generally, this is a safe procedure. However, as with any procedure, complications can occur. Possible complications include:  You develop pain or pressure in the following areas:  Chest.  Jaw or neck.  Between your shoulder blades.  Radiating down your left arm.  Headache.  Dizziness or light-headedness.  Shortness of breath.  Increased or irregular heartbeat.  Low blood pressure.  Nausea or vomiting.  Flushing.  Redness going up the arm and slight pain during injection of medicine.  Heart attack (rare). BEFORE THE PROCEDURE   Avoid all forms of caffeine for 24 hours before your test or as directed by your health care provider. This includes coffee, tea (even decaffeinated tea), caffeinated sodas, chocolate, cocoa, and certain pain medicines.  Follow your health care provider's instructions regarding eating and drinking before the test.  Take your medicines as directed at regular times with water unless instructed otherwise. Exceptions may include:  If you have diabetes, ask how you are to  take your insulin or pills. It is common to adjust insulin dosing the morning of the test.  If you are taking beta-blocker medicines, it is important to talk to your health care provider about these medicines well before the date of your test. Taking beta-blocker medicines may interfere with the test. In some cases, these medicines need to be changed or stopped 24 hours or more before the test.  If you wear a nitroglycerin patch, it may need to be removed prior to the test. Ask your health care provider if the patch should be removed before the test.  If you use an inhaler for any breathing condition, bring it with you to the test.  If you are an outpatient, bring a snack so you can eat right after the stress phase of the test.  Do not smoke for 4 hours prior to the test or as directed by your health care provider.  Do not apply lotions, powders, creams, or oils on your chest prior to the test.  Wear comfortable shoes and clothing. Let your health care provider know if you were unable to complete or follow the preparations for your test. PROCEDURE   Multiple patches (electrodes) will be put on your chest. If needed, small areas of your chest may be shaved to get better contact with  the electrodes. Once the electrodes are attached to your body, multiple wires will be attached to the electrodes, and your heart rate will be monitored.  An IV access will be started. A nuclear trace (isotope) is given. The isotope may be given intravenously, or it may be swallowed. Nuclear refers to several types of radioactive isotopes, and the nuclear isotope lights up the arteries so that the nuclear images are clear. The isotope is absorbed by your body. This results in low radiation exposure.  A resting nuclear image is taken to show how your heart functions at rest.  A medicine is given through the IV access.  A second scan is done about 1 hour after the medicine injection and determines how your heart  functions under stress.  During this stress phase, you will be connected to an electrocardiogram machine. Your blood pressure and oxygen levels will be monitored. AFTER THE PROCEDURE   Your heart rate and blood pressure will be monitored after the test.  You may return to your normal schedule, including diet,activities, and medicines, unless your health care provider tells you otherwise.   This information is not intended to replace advice given to you by your health care provider. Make sure you discuss any questions you have with your health care provider.   Document Released: 03/06/2009 Document Revised: 10/23/2013 Document Reviewed: 06/25/2013 Elsevier Interactive Patient Education [employer]  If you need a refill on your cardiac medications before your next appointment, please call your pharmacy.

## 2015-11-12 ENCOUNTER — Telehealth: Payer: Self-pay | Admitting: Interventional Cardiology

## 2015-11-12 LAB — BRAIN NATRIURETIC PEPTIDE: BRAIN NATRIURETIC PEPTIDE: 105.3 pg/mL — AB (ref 0.0–100.0)

## 2015-11-12 NOTE — Telephone Encounter (Signed)
Called patient about lab results. Patient is aware of results.

## 2015-11-12 NOTE — Telephone Encounter (Signed)
Left message to call back  

## 2015-11-12 NOTE — Telephone Encounter (Signed)
New problem ° ° °Pt returning your call. °

## 2015-11-13 ENCOUNTER — Other Ambulatory Visit: Payer: Self-pay | Admitting: Interventional Cardiology

## 2015-11-20 ENCOUNTER — Telehealth (HOSPITAL_COMMUNITY): Payer: Self-pay | Admitting: *Deleted

## 2015-11-20 NOTE — Telephone Encounter (Signed)
Patient given detailed instructions per Myocardial Perfusion Study Information Sheet for the test on 11/25/15 at 945. Patient notified to arrive 15 minutes early and that it is imperative to arrive on time for appointment to keep from having the test rescheduled.  If you need to cancel or reschedule your appointment, please call the office within 24 hours of your appointment. Failure to do so may result in a cancellation of your appointment, and a $50 no show fee. Patient verbalized understanding.Antionette Char, RN

## 2015-11-25 ENCOUNTER — Ambulatory Visit (HOSPITAL_COMMUNITY): Payer: Medicare Other | Attending: Cardiovascular Disease

## 2015-11-25 ENCOUNTER — Other Ambulatory Visit: Payer: Self-pay

## 2015-11-25 ENCOUNTER — Ambulatory Visit (HOSPITAL_BASED_OUTPATIENT_CLINIC_OR_DEPARTMENT_OTHER): Payer: Medicare Other

## 2015-11-25 DIAGNOSIS — R0789 Other chest pain: Secondary | ICD-10-CM | POA: Diagnosis not present

## 2015-11-25 DIAGNOSIS — I517 Cardiomegaly: Secondary | ICD-10-CM | POA: Diagnosis not present

## 2015-11-25 DIAGNOSIS — R0602 Shortness of breath: Secondary | ICD-10-CM | POA: Diagnosis not present

## 2015-11-25 DIAGNOSIS — I10 Essential (primary) hypertension: Secondary | ICD-10-CM | POA: Diagnosis not present

## 2015-11-25 DIAGNOSIS — I340 Nonrheumatic mitral (valve) insufficiency: Secondary | ICD-10-CM | POA: Insufficient documentation

## 2015-11-25 DIAGNOSIS — I071 Rheumatic tricuspid insufficiency: Secondary | ICD-10-CM | POA: Insufficient documentation

## 2015-11-25 DIAGNOSIS — R0609 Other forms of dyspnea: Secondary | ICD-10-CM | POA: Insufficient documentation

## 2015-11-25 LAB — MYOCARDIAL PERFUSION IMAGING
CHL CUP NUCLEAR SDS: 1
CHL CUP NUCLEAR SSS: 1
CSEPPHR: 80 {beats}/min
LHR: 0.35
LV dias vol: 68 mL
LVSYSVOL: 26 mL
Rest HR: 61 {beats}/min
SRS: 0
TID: 0.98

## 2015-11-25 MED ORDER — TECHNETIUM TC 99M SESTAMIBI GENERIC - CARDIOLITE
10.4000 | Freq: Once | INTRAVENOUS | Status: AC | PRN
  Administered 2015-11-25: 10 via INTRAVENOUS

## 2015-11-25 MED ORDER — TECHNETIUM TC 99M SESTAMIBI GENERIC - CARDIOLITE
30.6000 | Freq: Once | INTRAVENOUS | Status: AC | PRN
  Administered 2015-11-25: 31 via INTRAVENOUS

## 2015-11-25 MED ORDER — REGADENOSON 0.4 MG/5ML IV SOLN
0.4000 mg | Freq: Once | INTRAVENOUS | Status: AC
  Administered 2015-11-25: 0.4 mg via INTRAVENOUS

## 2015-12-02 ENCOUNTER — Other Ambulatory Visit: Payer: Self-pay | Admitting: Internal Medicine

## 2015-12-02 ENCOUNTER — Ambulatory Visit
Admission: RE | Admit: 2015-12-02 | Discharge: 2015-12-02 | Disposition: A | Discharge status: Home / Self Care | Payer: Medicare Other | Source: Ambulatory Visit | Attending: Internal Medicine | Admitting: Internal Medicine

## 2015-12-02 DIAGNOSIS — R9389 Abnormal findings on diagnostic imaging of other specified body structures: Secondary | ICD-10-CM

## 2015-12-05 ENCOUNTER — Telehealth: Payer: Self-pay | Admitting: Interventional Cardiology

## 2015-12-05 DIAGNOSIS — J90 Pleural effusion, not elsewhere classified: Secondary | ICD-10-CM

## 2015-12-05 NOTE — Telephone Encounter (Signed)
[name] is calling to say that she is still needing a referral to see a Lung Specialist . Please call on her cel [phone]

## 2015-12-08 NOTE — Telephone Encounter (Signed)
Patient said that she has been waiting for a referral from Dr. Isabel Caprice for a lung specialist.   When she had an OV she said the PA told her she was going to have her follow up with a lung specialist because of fluid in her lung.  Routing to Dr. Isabel Caprice and Ronie Spies PA-C

## 2015-12-08 NOTE — Telephone Encounter (Signed)
Talked to patient about referral and read document from [facility]   Referral made to [facility]

## 2015-12-08 NOTE — Telephone Encounter (Signed)
Based on unremarkable echo and nuc, I had recommended pulmonology evaluation. The result note indicates she was feeling better and wanted to defer this referral. BNP was near normal so did not appear there was significant fluid in her lungs. If she still wants to see pulm, would go ahead and refer - thanks! Dayna Dunn PA-C

## 2015-12-11 ENCOUNTER — Encounter: Payer: Self-pay | Admitting: Pulmonary Disease

## 2015-12-11 ENCOUNTER — Ambulatory Visit (INDEPENDENT_AMBULATORY_CARE_PROVIDER_SITE_OTHER): Payer: Medicare Other | Admitting: Pulmonary Disease

## 2015-12-11 VITALS — BP 128/68 | HR 65 | Ht 62.0 in | Wt 118.2 lb

## 2015-12-11 DIAGNOSIS — R0600 Dyspnea, unspecified: Secondary | ICD-10-CM

## 2015-12-11 DIAGNOSIS — I2510 Atherosclerotic heart disease of native coronary artery without angina pectoris: Secondary | ICD-10-CM | POA: Diagnosis not present

## 2015-12-11 DIAGNOSIS — J90 Pleural effusion, not elsewhere classified: Secondary | ICD-10-CM | POA: Diagnosis not present

## 2015-12-11 DIAGNOSIS — R0602 Shortness of breath: Secondary | ICD-10-CM | POA: Diagnosis not present

## 2015-12-11 MED ORDER — TIOTROPIUM BROMIDE MONOHYDRATE 2.5 MCG/ACT IN AERS: 2.0000 | INHALATION_SPRAY | Freq: Every day | RESPIRATORY_TRACT | Status: DC

## 2015-12-11 NOTE — Assessment & Plan Note (Signed)
Patient is a nonsmoker but was exposed to burning wood growing up and also to husband who chronically smoke. Chest CT scan (December/2016): Mild COPD changes. Chest x-ray (October/2007): Mild COPD changes. This was done after the bypass. I think patient may have underlying COPD from secondhand exposure to cigarette smoke. She has been more short of breath with doing ADLs. She gets dyspneic with square dancing and bowling.  Plan: #1. We will need PFT and blood gas. #2. Start Spiriva respimat, 2 puffs daily. We will give her samples to last 2 weeks. Told her to go to the pharmacy if she is better with medicines. #3. She may end up needing inhaled steroids if she is not significantly better on Spiriva. #4. May need to prescribe albuterol on follow-up. Patient has chronic atrial fibrillation but is controlled. #5. She is up-to-date with influenza and pneumonia vaccine.

## 2015-12-11 NOTE — Assessment & Plan Note (Signed)
Patient with recent shortness of breath. She gets dyspneic with more than ADLs. Started in December 2016. Chest x-ray in December showed small pleural effusion. Chest CT scan 10/31/2015-mild COPD changes. Small right pleural effusion, atelectasis versus scar tissue over the right lung base, lateral. Minimal effusion on the left lung. Chest x-ray done in October 2007-minimal to small left pleural effusion. All images were personally reviewed. I think the effusion is too small to cause her to be short of breath. I don't think her current issue of dyspnea is related to the pleural effusion. The effusion is also too small to be tapped. She might have underlying COPD causing her to be dyspneic.  Plan: #1. Work her up for possible COPD. #2. Repeat chest x-ray in 6 months. July 2017. #3. Told patient to let us know if she has more bleeding issues prior to follow-up.

## 2015-12-11 NOTE — Assessment & Plan Note (Signed)
Patient has CAD. She had bypass in 2007. She follows with cardiology. Recent  dyspnea. 2-D echo (January/2017): EF 55%. No pulmonary hypertension. BNP was normal. Will observe

## 2015-12-11 NOTE — Progress Notes (Signed)
Subjective:    Patient ID: [name], female    DOB: December 15, 1931, 82 y.o.   MRN: [id number]  HPI    This is the case of [name], 82 y.o. Female, who was referred by Dr. Lance Muss  in consultation regarding her dyspnea and pleural effusion.   As you very well know, patient is a non smoker but was exposed to husband who was smoking. She was also exposed to burning wood growing up. Husband smoked for 37 yrs.  Not been dxed with asthma, copd, or any lung problems. Pt had CABG in 2007 and she follows with cardiology.  Pt has been slowly SOB with ADLs for a year now.  Since Dec 2016, she noticed she has been more SOB. Gets SOB with square dancing. She feels tight in airway. No infection then. She took Abx x 10d-- helped some but back at SOB with ADLs.  Pt had CXR and chest ct scan.   She gets SOB with more than ADLs.  She bowls and does well still.    Review of Systems  Constitutional: Negative.  Negative for fever and unexpected weight change.  HENT: Positive for postnasal drip. Negative for congestion, dental problem, ear pain, nosebleeds, rhinorrhea, sinus pressure, sneezing, sore throat and trouble swallowing.   Eyes: Positive for redness and itching.  Respiratory: Positive for cough and shortness of breath. Negative for chest tightness, wheezing and stridor.   Cardiovascular: Negative.  Negative for chest pain, palpitations and leg swelling.  Gastrointestinal: Negative.  Negative for nausea and vomiting.  Endocrine: Negative.   Genitourinary: Negative.  Negative for dysuria.  Musculoskeletal: Positive for arthralgias. Negative for joint swelling.  Skin: Negative.  Negative for rash.  Allergic/Immunologic: Negative.   Neurological: Negative.  Negative for headaches.  Hematological: Negative.  Does not bruise/bleed easily.  Psychiatric/Behavioral: Negative.  Negative for dysphoric mood. The patient is not nervous/anxious.   All other systems reviewed and are  negative.  Past Medical History  Diagnosis Date  . Essential hypertension   . Coronary artery disease     a. s/p CABGx2 06/2006 (LIMA-LAD, SVG-LCx at bifurcation of OM II/III). b. Nuc 11/2012: no evidence of ischemia, attenuation artifact c/w breast attenuation in anterior region, essentially low risk, EF 88%.  . Osteopenia     involved on BMD 06/01/07 (T score-1.6); slightly worse in 3/13 (T score -1.6; FRAX 3%  and 12%)-plan rechcekin 2016  . Iron deficiency anemia   . Hyperlipidemia   . Hypothyroidism   . Osteoarthritis   . UTI (lower urinary tract infection) 3/11  . Uterine prolapse     post hysterectomy, Dr. Vincente Poli  . Insomnia     on [place], failed other meds  . Chronic atrial fibrillation (HCC)     a. Pt declined cardioversion.   (-) DVT, CA  Family History  Problem Relation Age of Onset  . Heart disease Mother   . Syncope episode Mother   . Ulcers Mother   . Heart attack Father   . CAD Father   . Diabetes Father   . Lymphoma Sister   . Cancer Brother     throat  . Diabetes Brother      Past Surgical History  Procedure Laterality Date  . Coronary artery bypass graft  2007  . Appendectomy    . Rotator cuff repair    . Ovarian cyst removal    . Lipoma excision    . Tear duct probing  X2 on left and x1 on right  . Cataract extraction, bilateral  2009    Social History   Social History  . Marital Status: Married    Spouse Name: N/A  . Number of Children: N/A  . Years of Education: N/A   Occupational History  . Not on file.   Social History Main Topics  . Smoking status: Never Smoker   . Smokeless tobacco: Never Used  . Alcohol Use: Not on file  . Drug Use: Not on file  . Sexual Activity: Not on file   Other Topics Concern  . Not on file   Social History Narrative  widow, has 1 son. Was a Runner, broadcasting/film/video. (-) ETOH. [place]    Allergies  Allergen Reactions  . Sulfa Antibiotics Rash     Outpatient Prescriptions Prior to Visit  Medication  Sig Dispense Refill  . acetaminophen (TYLENOL) 325 MG tablet Take 650 mg by mouth every 6 (six) hours as needed for mild pain.     [name] apixaban (ELIQUIS) 5 MG TABS tablet Take 1 tablet (5 mg total) by mouth 2 (two) times daily. 180 tablet 3  . atorvastatin (LIPITOR) 20 MG tablet Take 1 tablet (20 mg total) by mouth daily. 90 tablet 3  . Biotin 5000 MCG CAPS 1 capsule by mouth daily. 30 capsule   . Calcium Carbonate-Vitamin D (CALCIUM-D PO) Take 1 tablet by mouth daily.     . Cholecalciferol (VITAMIN D) 2000 UNITS CAPS Take 2,000 Units by mouth daily.     . furosemide (LASIX) 20 MG tablet Take 1 tablet (20 mg total) by mouth daily. 90 tablet 3  . levothyroxine (SYNTHROID, LEVOTHROID) 75 MCG tablet Take 75 mcg by mouth. 1 tablet 6 days a week and a 1/2 tab one day a week    . metoprolol succinate (TOPROL-XL) 50 MG 24 hr tablet 1 tablet by mouth daily 90 tablet 3  . Misc Natural Products (OSTEO BI-FLEX ADV JOINT SHIELD PO) Take 1 tablet by mouth 2 (two) times daily.     . nitroGLYCERIN (NITROSTAT) 0.4 MG SL tablet Place 1 tablet (0.4 mg total) under the tongue every 5 (five) minutes as needed for chest pain. 25 tablet 5  . Omega-3 Fatty Acids (FISH OIL) 1000 MG CAPS Take 1 capsule by mouth 2 (two) times daily.     [name] omeprazole (PRILOSEC) 20 MG capsule Take 20 mg by mouth daily.    . vitamin B-12 (CYANOCOBALAMIN) 500 MCG tablet Take 500 mcg by mouth daily.    [name] zolpidem (AMBIEN) 5 MG tablet Take 5 mg by mouth at bedtime as needed for sleep.     No facility-administered medications prior to visit.   Meds ordered this encounter  Medications  . Tiotropium Bromide Monohydrate (SPIRIVA RESPIMAT) 2.5 MCG/ACT AERS    Sig: Inhale 2 puffs into the lungs daily.    Dispense:  4 g    Refill:  3          Objective:   Physical Exam   Vitals:  Filed Vitals:   12/11/15 0902  BP: 128/68  Pulse: 65  Height: 5\' 2"  (1.575 m)  Weight: 118 lb 3.2 oz (53.615 kg)  SpO2: 98%    Constitutional/General:   Pleasant, well-nourished, well-developed, not in any distress,  Comfortably seating.  Well kempt  Body mass index is 21.61 kg/(m^2).  HEENT: Pupils equal and reactive to light and accommodation. Anicteric sclerae. Normal nasal mucosa.   No oral  lesions,  mouth  clear,  oropharynx clear, no postnasal drip. (-) Oral thrush. No dental caries.  Airway - Mallampati class III  Neck: No masses. Midline trachea. No JVD, (-) LAD. (-) bruits appreciated.  Respiratory/Chest: Grossly normal chest. (-) deformity. (-) Accessory muscle use.  Symmetric expansion. (-) Tenderness on palpation.  Resonant on percussion.  Diminished BS on both lower lung zones. (-) wheezing, crackles, rhonchi (-) egophony  Cardiovascular: Regular rate and  rhythm, heart sounds normal, no murmur or gallops, no peripheral edema  Gastrointestinal:  Normal bowel sounds. Soft, non-tender. No hepatosplenomegaly.  (-) masses.   Musculoskeletal:  Normal muscle tone. Normal gait.   Extremities: Grossly normal. (-) clubbing, cyanosis.  (-) edema  Skin: (-) rash,lesions seen.   Neurological/Psychiatric : alert, oriented to time, place, person. Normal mood and affect           Assessment & Plan:  Pleural effusion Patient with recent shortness of breath. She gets dyspneic with more than ADLs. Started in December 2016. Chest x-ray in December showed small pleural effusion. Chest CT scan 10/31/2015-mild COPD changes. Small right pleural effusion, atelectasis versus scar tissue over the right lung base, lateral. Minimal effusion on the left lung. Chest x-ray done in October 2007-minimal to small left pleural effusion. All images were personally reviewed. I think the effusion is too small to cause her to be short of breath. I don't think her current issue of dyspnea is related to the pleural effusion. The effusion is also too small to be tapped. She might have underlying COPD causing her to be dyspneic.  Plan: #1. Work  her up for possible COPD. #2. Repeat chest x-ray in 6 months. July 2017. #3. Told patient to let us know if she has more bleeding issues prior to follow-up.  Shortness of breath Patient is a nonsmoker but was exposed to burning wood growing up and also to husband who chronically smoke. Chest CT scan (December/2016): Mild COPD changes. Chest x-ray (October/2007): Mild COPD changes. This was done after the bypass. I think patient may have underlying COPD from secondhand exposure to cigarette smoke. She has been more short of breath with doing ADLs. She gets dyspneic with square dancing and bowling.  Plan: #1. We will need PFT and blood gas. #2. Start Spiriva respimat, 2 puffs daily. We will give her samples to last 2 weeks. Told her to go to the pharmacy if she is better with medicines. #3. She may end up needing inhaled steroids if she is not significantly better on Spiriva. #4. May need to prescribe albuterol on follow-up. Patient has chronic atrial fibrillation but is controlled. #5. She is up-to-date with influenza and pneumonia vaccine.  Coronary atherosclerosis of native coronary artery Patient has CAD. She had bypass in 2007. She follows with cardiology. Recent  dyspnea. 2-D echo (January/2017): EF 55%. No pulmonary hypertension. BNP was normal. Will observe       Thank you very much for letting me participate in this patient's care. Please do not hesitate to give me a call if you have any questions or concerns regarding the treatment plan.   Patient will follow up with me in 4-6 weeks    J. Angelo A. Christene Slates, MD Pulmonary and Critical Care Medicine [facility] [phone], Fax: [phone]

## 2015-12-11 NOTE — Patient Instructions (Signed)
You have Shortness of breath which I think might be related to COPD.  We will start you on Spiriva respimat 2 puffs 1x/day. We will give you samples to last 2 weeks. If samples help you, get the medicine from your Pharmacy.  We will schedule you for breathing test and ABG.   Return to clinic in 4-6 weeks.    Lance Sell A. Christene Slates, MD Pulmonary and Critical Care Medicine [facility] [phone], Fax: [phone]

## 2015-12-17 ENCOUNTER — Ambulatory Visit (HOSPITAL_COMMUNITY)
Admission: RE | Admit: 2015-12-17 | Discharge: 2015-12-17 | Disposition: A | Discharge status: Home / Self Care | Payer: Medicare Other | Source: Ambulatory Visit | Attending: Pulmonary Disease | Admitting: Pulmonary Disease

## 2015-12-17 DIAGNOSIS — R0600 Dyspnea, unspecified: Secondary | ICD-10-CM | POA: Insufficient documentation

## 2015-12-17 LAB — PULMONARY FUNCTION TEST
DL/VA % pred: 75 %
DL/VA: 3.21 ml/min/mmHg/L
DLCO cor % pred: 74 %
DLCO cor: 14.04 ml/min/mmHg
DLCO unc % pred: 68 %
DLCO unc: 12.88 ml/min/mmHg
FEF 25-75 Post: 0.6 L/sec
FEF 25-75 Pre: 0.55 L/sec
FEF2575-%Change-Post: 8 %
FEF2575-%Pred-Post: 54 %
FEF2575-%Pred-Pre: 50 %
FEV1-%Change-Post: 10 %
FEV1-%Pred-Post: 72 %
FEV1-%Pred-Pre: 65 %
FEV1-Post: 1.09 L
FEV1-Pre: 0.99 L
FEV1FVC-%Change-Post: 17 %
FEV1FVC-%Pred-Pre: 51 %
FEV6-%Change-Post: -4 %
FEV6-%Pred-Post: 128 %
FEV6-%Pred-Pre: 135 %
FEV6-Post: 2.48 L
FEV6-Pre: 2.61 L
FEV6FVC-%Change-Post: 1 %
FEV6FVC-%Pred-Post: 106 %
FEV6FVC-%Pred-Pre: 104 %
FVC-%Change-Post: -6 %
FVC-%Pred-Post: 120 %
FVC-%Pred-Pre: 128 %
FVC-Post: 2.48 L
FVC-Pre: 2.65 L
Post FEV1/FVC ratio: 44 %
Post FEV6/FVC ratio: 100 %
Pre FEV1/FVC ratio: 37 %
Pre FEV6/FVC Ratio: 99 %
RV % pred: 87 %
RV: 1.93 L
TLC % pred: 103 %
TLC: 4.62 L

## 2015-12-17 MED ORDER — ALBUTEROL SULFATE (2.5 MG/3ML) 0.083% IN NEBU
2.5000 mg | INHALATION_SOLUTION | Freq: Once | RESPIRATORY_TRACT | Status: AC
  Administered 2015-12-17: 2.5 mg via RESPIRATORY_TRACT

## 2015-12-23 ENCOUNTER — Ambulatory Visit (INDEPENDENT_AMBULATORY_CARE_PROVIDER_SITE_OTHER): Payer: Medicare Other | Admitting: Cardiology

## 2015-12-23 ENCOUNTER — Encounter: Payer: Self-pay | Admitting: Cardiology

## 2015-12-23 VITALS — BP 144/76 | HR 69 | Ht 62.0 in | Wt 117.0 lb

## 2015-12-23 DIAGNOSIS — I4891 Unspecified atrial fibrillation: Secondary | ICD-10-CM

## 2015-12-23 DIAGNOSIS — R0602 Shortness of breath: Secondary | ICD-10-CM | POA: Diagnosis not present

## 2015-12-23 DIAGNOSIS — I2510 Atherosclerotic heart disease of native coronary artery without angina pectoris: Secondary | ICD-10-CM

## 2015-12-23 DIAGNOSIS — M1990 Unspecified osteoarthritis, unspecified site: Secondary | ICD-10-CM | POA: Insufficient documentation

## 2015-12-23 DIAGNOSIS — H269 Unspecified cataract: Secondary | ICD-10-CM | POA: Insufficient documentation

## 2015-12-23 DIAGNOSIS — I10 Essential (primary) hypertension: Secondary | ICD-10-CM | POA: Insufficient documentation

## 2015-12-23 DIAGNOSIS — I519 Heart disease, unspecified: Secondary | ICD-10-CM | POA: Insufficient documentation

## 2015-12-23 NOTE — Progress Notes (Signed)
Cardiology Office Note   Date:  12/23/2015   ID:  [name], DOB 1932/05/16, MRN [id number]  PCP:  Tommy Rainwater, MD  Cardiologist:  [name]    Chief Complaint  Patient presents with  . Shortness of Breath      History of Present Illness: [name] is a 82 y.o. female who presents for follow up of Echo and lexiscan myoview for SOB with her hx CAD. She was seen by Ronie Spies, PA in Jan. 2017  Stress test was normal and  Echo looks similar to prior.  She has since seen pulmonologist.  Had PFTs and revealed COPD- she was started on spiriva though she states she is not sure it is helping.   Labs done were normal except CBC but Hgb was improved. Her platelets run low to 125- 4 years ago  She has a history of CAD s/p CABGx2 06/2006 (LIMA-LAD, SVG-LCx at bifurcation of OM II/III), chronic afib (patient declined DCCV), essential HTN, HLD, iron deficiency anemia.   Prior Nuc 11/2012: no evidence of ischemia, attenuation artifact c/w breast attenuation in anterior region, essentially low risk, EF 88%. Last echo 11/2013: EF 55-60%, no RWMA, mild MR, severely dilated RA, mod TR, mildly dilated LA, PASP .   Pt without chest pain and her SOB is still there when she dances now she takes one dance and another lady takes the second.  He reviewed her echo with comparing to the last and her stress test.  Answered her questions.   Past Medical History  Diagnosis Date  . Essential hypertension   . Coronary artery disease     a. s/p CABGx2 06/2006 (LIMA-LAD, SVG-LCx at bifurcation of OM II/III). b. Nuc 11/2012: no evidence of ischemia, attenuation artifact c/w breast attenuation in anterior region, essentially low risk, EF 88%.  . Osteopenia     involved on BMD 06/01/07 (T score-1.6); slightly worse in 3/13 (T score -1.6; FRAX 3%  and 12%)-plan rechcekin 2016  . Iron deficiency anemia   . Hyperlipidemia   . Hypothyroidism   . Osteoarthritis   . UTI (lower urinary tract  infection) 3/11  . Uterine prolapse     post hysterectomy, Dr. Vincente Poli  . Insomnia     on [place], failed other meds  . Chronic atrial fibrillation (HCC)     a. Pt declined cardioversion.    Past Surgical History  Procedure Laterality Date  . Coronary artery bypass graft  2007  . Appendectomy    . Rotator cuff repair    . Ovarian cyst removal    . Lipoma excision    . Tear duct probing      X2 on left and x1 on right  . Cataract extraction, bilateral  2009     Current Outpatient Prescriptions  Medication Sig Dispense Refill  . acetaminophen (TYLENOL) 325 MG tablet Take 650 mg by mouth every 6 (six) hours as needed for mild pain.     [name] apixaban (ELIQUIS) 5 MG TABS tablet Take 1 tablet (5 mg total) by mouth 2 (two) times daily. 180 tablet 3  . atorvastatin (LIPITOR) 20 MG tablet Take 1 tablet (20 mg total) by mouth daily. 90 tablet 3  . Biotin 5000 MCG CAPS 1 capsule by mouth daily. 30 capsule   . Calcium Carbonate-Vitamin D (CALCIUM-D PO) Take 1 tablet by mouth daily.     . Cholecalciferol (VITAMIN D) 2000 UNITS CAPS Take 2,000 Units by mouth daily.     [name]  furosemide (LASIX) 20 MG tablet Take 1 tablet (20 mg total) by mouth daily. 90 tablet 3  . levothyroxine (SYNTHROID, LEVOTHROID) 75 MCG tablet Take 75 mcg by mouth. 1 tablet 6 days a week and a 1/2 tab one day a week    . metoprolol succinate (TOPROL-XL) 50 MG 24 hr tablet 1 tablet by mouth daily 90 tablet 3  . Misc Natural Products (OSTEO BI-FLEX ADV JOINT SHIELD PO) Take 1 tablet by mouth 2 (two) times daily.     . nitroGLYCERIN (NITROSTAT) 0.4 MG SL tablet Place 1 tablet (0.4 mg total) under the tongue every 5 (five) minutes as needed for chest pain. 25 tablet 5  . Omega-3 Fatty Acids (FISH OIL) 1000 MG CAPS Take 1 capsule by mouth 2 (two) times daily.     [name] omeprazole (PRILOSEC) 20 MG capsule Take 20 mg by mouth daily.    . Tiotropium Bromide Monohydrate (SPIRIVA RESPIMAT) 2.5 MCG/ACT AERS Inhale 2 puffs into the lungs daily. 4  g 3  . vitamin B-12 (CYANOCOBALAMIN) 500 MCG tablet Take 500 mcg by mouth daily.    [name] zolpidem (AMBIEN) 5 MG tablet Take 5 mg by mouth at bedtime as needed for sleep.     No current facility-administered medications for this visit.    Allergies:   Sulfa antibiotics    Social History:  The patient  reports that she has never smoked. She has never used smokeless tobacco.   Family History:  The patient's family history includes CAD in her father; Cancer in her brother; Diabetes in her brother and father; Heart attack in her father; Heart disease in her mother; Lymphoma in her sister; Syncope episode in her mother; Ulcers in her mother.    ROS:  General:no colds or fevers, no weight changes Skin:no rashes or ulcers HEENT:no blurred vision, no congestion CV:see HPI PUL:see HPI GI:no diarrhea constipation or melena, no indigestion GU:no hematuria, no dysuria   Wt Readings from Last 3 Encounters:  12/23/15 117 lb (53.071 kg)  12/11/15 118 lb 3.2 oz (53.615 kg)  11/25/15 117 lb (53.071 kg)     PHYSICAL EXAM: VS:  BP 144/76 mmHg  Pulse 69  Ht  (1.575 m)  Wt 117 lb (53.071 kg)  BMI 21.39 kg/m2 , BMI Body mass index is 21.39 kg/(m^2). General:Pleasant affect, NAD Skin:Warm and dry, brisk capillary refill HEENT:normocephalic, sclera clear, mucus membranes moist Neck:supple, no JVD, no bruits  Heart: irreg irreg without murmur, gallup, rub or click Lungs:clear without rales, rhonchi, or wheezes WUJ:WJXB, non tender, + BS, do not palpate liver spleen or masses Ext:no lower ext edema Neuro:alert and oriented, MAE, follows commands, + facial symmetry  RECHECK BP 128/78  EKG:  EKG is ordered today. The ekg ordered today demonstrates a fib no acute changes rate controlled.     Recent Labs: 11/11/2015: BUN 11; Creat 0.71; Hemoglobin 11.0*; Platelets SEE NOTE; Potassium 4.0; Sodium 140; TSH 2.339    Lipid Panel No results found for: CHOL, TRIG, HDL, CHOLHDL, VLDL, LDLCALC,  LDLDIRECT     Other studies Reviewed: Additional studies/ records that were reviewed today include: NUC study: .Overall Study Impression Myocardial perfusion is normal. The study is normal. Overall left ventricular systolic function was normal. Nuclear stress EF: 62%. The left ventricular ejection fraction is normal (55-65%). There are no images available for comparison  ECHO: Study Conclusions  - Left ventricle: The cavity size was normal. Systolic function was normal. The estimated ejection fraction was in the range of  50% to 55%. Wall motion was normal; there were no regional wall motion abnormalities. - Mitral valve: Calcified annulus. There was mild regurgitation. - Left atrium: The atrium was moderately dilated. - Right ventricle: Systolic function was mildly reduced. - Tricuspid valve: There was moderate regurgitation. Peak RV-RA gradient (S): 37 mm Hg. - Pulmonary arteries: Systolic pressure was within the normal range. PA peak pressure: 45 mm Hg (S). - Inferior vena cava: The vessel was normal in size. The respirophasic diameter changes were in the normal range (= 50%), consistent with normal central venous pressure.    ASSESSMENT AND PLAN:  1.   Shortness of breath - symptoms continue but now on Spiriva with PFTs revealing COPD followed .  Second hand exposure.  Her cardiac stress test and Echo were all stable.  She was reassured from cardiac perspective.   2. Chronic atrial fibrillation - not clear that this is contributing to her dyspnea as she has historically been asymptomatic in this rhythm.  3. CAD as above - continue statin, beta blocker. Not on ASA due to being on Eliquis. Negative stress test. 4. Essential HTN - initial BP mildly elevated but f/u BP normal. I asked her to follow periodically at home and call if she is seeing any routine higher readings. 5. H/o anemia - CBC with improvement of HGB hx thrombocytopenia.      Current medicines are  reviewed with the patient today.  The patient Has no concerns regarding medicines.  The following changes have been made:  See above Labs/ tests ordered today include:see above  Disposition:   FU:  see above  Nyoka Lint, NP  12/23/2015 11:31 AM    [facility] [address] [place], [place], [place]  [address]/ [address] [place], [place] Phone: [phone]; Fax: [phone]  [phone]

## 2015-12-23 NOTE — Patient Instructions (Signed)
Medication Instructions:  Your physician recommends that you continue on your current medications as directed. Please refer to the Current Medication list given to you today.   Labwork: None ordered  Testing/Procedures: None ordered  Follow-Up: Your physician wants you to follow-up in: 4 MONTHS WITH DR. VARANASI.  You will receive a reminder letter in the mail two months in advance. If you don't receive a letter, please call our office to schedule the follow-up appointment.    Any Other Special Instructions Will Be Listed Below (If Applicable).     If you need a refill on your cardiac medications before your next appointment, please call your pharmacy.

## 2016-01-14 ENCOUNTER — Ambulatory Visit (INDEPENDENT_AMBULATORY_CARE_PROVIDER_SITE_OTHER): Payer: Medicare Other | Admitting: Pulmonary Disease

## 2016-01-14 ENCOUNTER — Encounter: Payer: Self-pay | Admitting: Pulmonary Disease

## 2016-01-14 VITALS — BP 108/62 | HR 90 | Ht 62.0 in | Wt 119.4 lb

## 2016-01-14 DIAGNOSIS — J439 Emphysema, unspecified: Secondary | ICD-10-CM

## 2016-01-14 DIAGNOSIS — J449 Chronic obstructive pulmonary disease, unspecified: Secondary | ICD-10-CM | POA: Insufficient documentation

## 2016-01-14 DIAGNOSIS — I2510 Atherosclerotic heart disease of native coronary artery without angina pectoris: Secondary | ICD-10-CM

## 2016-01-14 DIAGNOSIS — J90 Pleural effusion, not elsewhere classified: Secondary | ICD-10-CM | POA: Diagnosis not present

## 2016-01-14 DIAGNOSIS — R0602 Shortness of breath: Secondary | ICD-10-CM | POA: Diagnosis not present

## 2016-01-14 NOTE — Assessment & Plan Note (Addendum)
PFT (February 2017) FEV1 ratio was 37%. FEV1 0.99 or 65%. Diffusion was 68%. Patient clinically improved with Spiriva. She wants to avoid medicines.  Plan: #1. Continue Spiriva respimat, 2 puffs daily.  #2. I wanted to prescribe albuterol but she does not want to. Told her to call the office if with worsening dyspnea. I wanted to see whether inhaled steroids and a  LABA combination will make her breathe better. She wanted to hold off unless she gets worse. #3. Patient has chronic atrial fibrillation but is controlled. Avoiding beta agonists. #4. She is up-to-date with influenza and pneumonia vaccine. #5. Has mouth dryness. Will try Biotene. Pt to call if not better.

## 2016-01-14 NOTE — Patient Instructions (Signed)
1. Continue Spiriva Respimat, 2 puffs daily. 2. Call the office if with worsening dyspnea prior to follow-up. 3. He will need a chest x-ray on her follow-up visit.  Return to clinic in 6 mos.

## 2016-01-14 NOTE — Assessment & Plan Note (Addendum)
Patient is a nonsmoker but was exposed to burning wood growing up and also to husband who chronically smoke. Chest CT scan (December/2016): Mild COPD changes. Chest x-ray (October/2007): Mild COPD changes. This was done after the bypass. PFT (February 2017) moderate COPD. Patient clinically improved with Spiriva. She wants to avoid medicines.  Plan: #1. Continue Spiriva respimat, 2 puffs daily.  #2. I wanted to prescribe albuterol but she does not want to. Told her to call the office if with worsening dyspnea. I wanted to see whether inhaled steroids and a  LABA combination will make her breathe better. She wanted to hold off unless she gets worse. #3. Patient has chronic atrial fibrillation but is controlled. Avoiding beta agonists. #4. She is up-to-date with influenza and pneumonia vaccine.

## 2016-01-14 NOTE — Progress Notes (Signed)
Subjective:    Patient ID: [name], female    DOB: 02/08/1932, 82 y.o.   MRN: [id number]  HPI    This is the case of [name], 82 y.o. Female, who was referred by Dr. Lance MussJayadeep Varanasi  in consultation regarding her dyspnea and pleural effusion.   As you very well know, patient is a non smoker but was exposed to husband who was smoking. She was also exposed to burning wood growing up. Husband smoked for 37 yrs.  Not been dxed with asthma, copd, or any lung problems. Pt had CABG in 2007 and she follows with cardiology.  Pt has been slowly SOB with ADLs for a year now.  Since Dec 2016, she noticed she has been more SOB. Gets SOB with square dancing. She feels tight in airway. No infection then. She took Abx x 10d-- helped some but back at SOB with ADLs.  Pt had CXR and chest ct scan.   She gets SOB with more than ADLs.  She bowls and does well still.   ROV (01/14/16)  PFT done showed FEV1 ratio was 37%. FEV1 of 0.99 or 65%. Patient is clinically improved with Spiriva. Has issues using Spiriva. Overall better with Spiriva. Has not been admitted now been on antibiotics since last seen.  Review of Systems  Constitutional: Negative.  Negative for fever and unexpected weight change.  HENT: Positive for postnasal drip. Negative for congestion, dental problem, ear pain, nosebleeds, rhinorrhea, sinus pressure, sneezing, sore throat and trouble swallowing.   Eyes: Positive for redness and itching.  Respiratory: Positive for cough and shortness of breath. Negative for chest tightness, wheezing and stridor.   Cardiovascular: Negative.  Negative for chest pain, palpitations and leg swelling.  Gastrointestinal: Negative.  Negative for nausea and vomiting.  Endocrine: Negative.   Genitourinary: Negative.  Negative for dysuria.  Musculoskeletal: Positive for arthralgias. Negative for joint swelling.  Skin: Negative.  Negative for rash.  Allergic/Immunologic: Negative.   Neurological:  Negative.  Negative for headaches.  Hematological: Negative.  Does not bruise/bleed easily.  Psychiatric/Behavioral: Negative.  Negative for dysphoric mood. The patient is not nervous/anxious.   All other systems reviewed and are negative.       Objective:   Physical Exam   Vitals:  Filed Vitals:   01/14/16 1010  BP: 108/62  Pulse: 90  Height: 5\' 2"  (1.575 m)  Weight: 119 lb 6.4 oz (54.159 kg)  SpO2: 97%    Constitutional/General:  Pleasant, well-nourished, well-developed, not in any distress,  Comfortably seating.  Well kempt  Body mass index is 21.83 kg/(m^2).  HEENT: Pupils equal and reactive to light and accommodation. Anicteric sclerae. Normal nasal mucosa.   No oral  lesions,  mouth clear,  oropharynx clear, no postnasal drip. (-) Oral thrush. No dental caries.  Airway - Mallampati class III  Neck: No masses. Midline trachea. No JVD, (-) LAD. (-) bruits appreciated.  Respiratory/Chest: Grossly normal chest. (-) deformity. (-) Accessory muscle use.  Symmetric expansion. (-) Tenderness on palpation.  Resonant on percussion.  Diminished BS on both lower lung zones. (-) wheezing, crackles, rhonchi (-) egophony  Cardiovascular: Regular rate and  rhythm, heart sounds normal, no murmur or gallops, no peripheral edema  Gastrointestinal:  Normal bowel sounds. Soft, non-tender. No hepatosplenomegaly.  (-) masses.   Musculoskeletal:  Normal muscle tone. Normal gait.   Extremities: Grossly normal. (-) clubbing, cyanosis.  (-) edema  Skin: (-) rash,lesions seen.   Neurological/Psychiatric : alert, oriented to time,  place, person. Normal mood and affect           Assessment & Plan:  Shortness of breath Patient is a nonsmoker but was exposed to burning wood growing up and also to husband who chronically smoke. Chest CT scan (December/2016): Mild COPD changes. Chest x-ray (October/2007): Mild COPD changes. This was done after the bypass. PFT (February 2017)  moderate COPD. Patient clinically improved with Spiriva. She wants to avoid medicines.  Plan: #1. Continue Spiriva respimat, 2 puffs daily.  #2. I wanted to prescribe albuterol but she does not want to. Told her to call the office if with worsening dyspnea. I wanted to see whether inhaled steroids and a  LABA combination will make her breathe better. She wanted to hold off unless she gets worse. #3. Patient has chronic atrial fibrillation but is controlled. Avoiding beta agonists. #4. She is up-to-date with influenza and pneumonia vaccine.    Pleural effusion Patient with recent shortness of breath. She gets dyspneic with more than ADLs. Started in December 2016. Chest x-ray in December showed small pleural effusion. Chest CT scan 10/31/2015-mild COPD changes. Small right pleural effusion, atelectasis versus scar tissue over the right lung base, lateral. Minimal effusion on the left lung. Chest x-ray done in October 2007-minimal to small left pleural effusion. All images were personally reviewed. Dyspnea better with Spiriva.  Plan: #1. Repeat chest x-ray in 6 months. Plan to do in 07/2016.     Coronary atherosclerosis of native coronary artery Patient has CAD. She had bypass in 2007. She follows with cardiology. Recent  dyspnea. 2-D echo (January/2017): EF 55%. No pulmonary hypertension. BNP was normal. Will observe    COPD (chronic obstructive pulmonary disease) (HCC) PFT (February 2017) FEV1 ratio was 37%. FEV1 0.99 or 65%. Diffusion was 68%. Patient clinically improved with Spiriva. She wants to avoid medicines.  Plan: #1. Continue Spiriva respimat, 2 puffs daily.  #2. I wanted to prescribe albuterol but she does not want to. Told her to call the office if with worsening dyspnea. I wanted to see whether inhaled steroids and a  LABA combination will make her breathe better. She wanted to hold off unless she gets worse. #3. Patient has chronic atrial fibrillation but is  controlled. Avoiding beta agonists. #4. She is up-to-date with influenza and pneumonia vaccine. #5. Has mouth dryness. Will try Biotene. Pt to call if not better.         Thank you very much for letting me participate in this patient's care. Please do not hesitate to give me a call if you have any questions or concerns regarding the treatment plan.   Patient will follow up with me in 4-6 weeks    J. Angelo A. Christene Slates, MD Pulmonary and Critical Care Medicine [facility] [phone], Fax: [phone]

## 2016-01-14 NOTE — Assessment & Plan Note (Addendum)
Patient with recent shortness of breath. She gets dyspneic with more than ADLs. Started in December 2016. Chest x-ray in December showed small pleural effusion. Chest CT scan 10/31/2015-mild COPD changes. Small right pleural effusion, atelectasis versus scar tissue over the right lung base, lateral. Minimal effusion on the left lung. Chest x-ray done in October 2007-minimal to small left pleural effusion. All images were personally reviewed. Dyspnea better with Spiriva.  Plan: #1. Repeat chest x-ray in 6 months. Plan to do in 07/2016.

## 2016-01-14 NOTE — Assessment & Plan Note (Signed)
Patient has CAD. She had bypass in 2007. She follows with cardiology. Recent  dyspnea. 2-D echo (January/2017): EF 55%. No pulmonary hypertension. BNP was normal. Will observe

## 2016-04-01 ENCOUNTER — Ambulatory Visit (INDEPENDENT_AMBULATORY_CARE_PROVIDER_SITE_OTHER): Payer: Medicare Other | Admitting: Interventional Cardiology

## 2016-04-01 ENCOUNTER — Encounter: Payer: Self-pay | Admitting: Interventional Cardiology

## 2016-04-01 VITALS — BP 120/60 | HR 72 | Ht 62.0 in | Wt 118.0 lb

## 2016-04-01 DIAGNOSIS — I482 Chronic atrial fibrillation, unspecified: Secondary | ICD-10-CM

## 2016-04-01 DIAGNOSIS — E782 Mixed hyperlipidemia: Secondary | ICD-10-CM | POA: Diagnosis not present

## 2016-04-01 DIAGNOSIS — I2510 Atherosclerotic heart disease of native coronary artery without angina pectoris: Secondary | ICD-10-CM | POA: Diagnosis not present

## 2016-04-01 DIAGNOSIS — I10 Essential (primary) hypertension: Secondary | ICD-10-CM

## 2016-04-01 DIAGNOSIS — R0602 Shortness of breath: Secondary | ICD-10-CM | POA: Diagnosis not present

## 2016-04-01 NOTE — Patient Instructions (Signed)
**Note De-identified [name] Obfuscation** Medication Instructions:  Same-no changes  Labwork: None  Testing/Procedures: None  Follow-Up: Your physician wants you to follow-up in: 1 year. You will receive a reminder letter in the mail two months in advance. If you don't receive a letter, please call our office to schedule the follow-up appointment.      If you need a refill on your cardiac medications before your next appointment, please call your pharmacy.   

## 2016-04-01 NOTE — Progress Notes (Signed)
Cardiology Office Note   Date:  04/01/2016   ID:  [name], DOB 09/03/1932, MRN [id number]  PCP:  [name], Marisa JARALLA, MD    No chief complaint on file.    Wt Readings from Last 3 Encounters:  04/01/16 118 lb (53.524 kg)  01/14/16 119 lb 6.4 oz (54.159 kg)  12/23/15 117 lb (53.071 kg)       History of Present Illness: [name] is a 82 y.o. female  who had CABG in 2007 for left main diseaes, by Dr. Dorris FetchHendrickson. She had hysterectomy in May 2012 and did well.    She goes bowling without problems. No sx like what she had before CABG. c/o Shortness of breath when she bends down. Resolves after a few seconds. [facility]. Now, her shortness of breath improves with longer duration activity. It may start at the beginning of the activity, but improves as she continues.  She has had some hip pain which has limited her walking and dancing.    Past Medical History  Diagnosis Date  . Essential hypertension   . Coronary artery disease     a. s/p CABGx2 06/2006 (LIMA-LAD, SVG-LCx at bifurcation of OM II/III). b. Nuc 11/2012: no evidence of ischemia, attenuation artifact c/w breast attenuation in anterior region, essentially low risk, EF 88%.  . Osteopenia     involved on BMD 06/01/07 (T score-1.6); slightly worse in 3/13 (T score -1.6; FRAX 3%  and 12%)-plan rechcekin 2016  . Iron deficiency anemia   . Hyperlipidemia   . Hypothyroidism   . Osteoarthritis   . UTI (lower urinary tract infection) 3/11  . Uterine prolapse     post hysterectomy, Dr. Vincente PoliGrewal  . Insomnia     on [place], failed other meds  . Chronic atrial fibrillation (HCC)     a. Pt declined cardioversion.    Past Surgical History  Procedure Laterality Date  . Coronary artery bypass graft  2007  . Appendectomy    . Rotator cuff repair    . Ovarian cyst removal    . Lipoma excision    . Tear duct probing      X2 on left and x1 on right  . Cataract  extraction, bilateral  2009     Current Outpatient Prescriptions  Medication Sig Dispense Refill  . acetaminophen (TYLENOL) 325 MG tablet Take 650 mg by mouth every 6 (six) hours as needed for mild pain.     [name]. apixaban (ELIQUIS) 5 MG TABS tablet Take 1 tablet (5 mg total) by mouth 2 (two) times daily. 180 tablet 3  . atorvastatin (LIPITOR) 20 MG tablet Take 1 tablet (20 mg total) by mouth daily. 90 tablet 3  . Bioflavonoid Products (ESTER-C) TABS Take 1 tablet by mouth daily.    . Biotin 5000 MCG CAPS 1 capsule by mouth daily. 30 capsule   . Calcium Carbonate-Vitamin D (CALCIUM-D PO) Take 1 tablet by mouth daily.     . Cholecalciferol (VITAMIN D) 2000 UNITS CAPS Take 2,000 Units by mouth daily.     . furosemide (LASIX) 20 MG tablet Take 1 tablet (20 mg total) by mouth daily. 90 tablet 3  . levothyroxine (SYNTHROID, LEVOTHROID) 75 MCG tablet Take 75 mcg by mouth. 1 tablet 6 days a week and a 1/2 tab one day a week    . metoprolol succinate (TOPROL-XL) 50 MG 24 hr tablet 1 tablet by mouth daily 90 tablet 3  .  Misc Natural Products (OSTEO BI-FLEX ADV JOINT SHIELD PO) Take 1 tablet by mouth 2 (two) times daily.     . nitroGLYCERIN (NITROSTAT) 0.4 MG SL tablet Place 0.4 mg under the tongue every 5 (five) minutes as needed for chest pain (X3 DOSES BEFORE CALLING 911).    . Omega-3 Fatty Acids (FISH OIL) 1000 MG CAPS Take 1 capsule by mouth 2 (two) times daily.     [name] omeprazole (PRILOSEC) 20 MG capsule Take 20 mg by mouth daily.    . Tiotropium Bromide Monohydrate (SPIRIVA RESPIMAT) 2.5 MCG/ACT AERS Inhale 2 puffs into the lungs daily. 4 g 3  . vitamin B-12 (CYANOCOBALAMIN) 500 MCG tablet Take 500 mcg by mouth daily.    [name] zolpidem (AMBIEN) 5 MG tablet Take 5 mg by mouth at bedtime as needed for sleep.     No current facility-administered medications for this visit.    Allergies:   Sulfa antibiotics    Social History:  The patient  reports that she has never smoked. She has never used smokeless  tobacco.   Family History:  The patient's family history includes CAD in her father; Cancer in her brother; Diabetes in her brother and father; Heart attack in her father; Heart disease in her mother; Lymphoma in her sister; Syncope episode in her mother; Ulcers in her mother.    ROS:  Please see the history of present illness.   Otherwise, review of systems are positive for hip pain.   All other systems are reviewed and negative.    PHYSICAL EXAM: VS:  BP 120/60 mmHg  Pulse 72  Ht 5\' 2"  (1.575 m)  Wt 118 lb (53.524 kg)  BMI 21.58 kg/m2 , BMI Body mass index is 21.58 kg/(m^2). GEN: Well nourished, well developed, in no acute distress HEENT: normal Neck: no JVD, carotid bruits, or masses Cardiac: irregularly irregular; no murmurs, rubs, or gallops,no edema  Respiratory:  clear to auscultation bilaterally, normal work of breathing GI: soft, nontender, nondistended, + BS MS: no deformity or atrophy Skin: warm and dry, no rash Neuro:  Strength and sensation are intact Psych: euthymic mood, full affect   EKG:   The ekg ordered today demonstrates atrial fibrillation, rate controlled   Recent Labs: 11/11/2015: Brain Natriuretic Peptide 105.3*; BUN 11; Creat 0.71; Hemoglobin 11.0*; Platelets SEE NOTE; Potassium 4.0; Sodium 140; TSH 2.339   Lipid Panel No results found for: CHOL, TRIG, HDL, CHOLHDL, VLDL, LDLCALC, LDLDIRECT   Other studies Reviewed: Additional studies/ records that were reviewed today with results demonstrating: Stress test and echo reviewed.   ASSESSMENT AND PLAN:  1. CAD: No clear angina.  SHOB improves with additional activity.  It is atypical for angina.  SHe does not want to pursue cath at this time.  Recent stress test and echo were unremarkable.  2. AFib: Rate controlled. Eliquis for stroke prevention.  Continue Toprol 3. DOE: She is following up with pulmonary. She is using an inhaler. This does not sound cardiac. We did discuss cardiac catheterization to  further evaluate her heart status. She would like to hold off on this. She does not feel like it is significantly worse that she wants to pursue invasive testing at this time. 4. Blood pressure stable. Continue current medicines. 5. Hyperlipidemia: Lipids checked by Dr. Eula Listen. Continue current lipid-lowering therapy.   Current medicines are reviewed at length with the patient today.  The patient concerns regarding her medicines were addressed.  The following changes have been made:  No change  Labs/ tests ordered today include:  No orders of the defined types were placed in this encounter.    Recommend 150 minutes/week of aerobic exercise Low fat, low carb, high fiber diet recommended  Disposition:   FU in 1 year   Signed, Lance Muss, MD  04/01/2016 12:38 PM    [facility] [address] [place], [place], [place]  [address] Phone: [phone]; Fax: [phone]

## 2016-06-10 ENCOUNTER — Other Ambulatory Visit: Payer: Self-pay | Admitting: Interventional Cardiology

## 2016-06-10 DIAGNOSIS — I10 Essential (primary) hypertension: Secondary | ICD-10-CM

## 2016-06-10 DIAGNOSIS — I4891 Unspecified atrial fibrillation: Secondary | ICD-10-CM

## 2016-06-11 ENCOUNTER — Other Ambulatory Visit: Payer: Self-pay

## 2016-06-11 MED ORDER — TIOTROPIUM BROMIDE MONOHYDRATE 2.5 MCG/ACT IN AERS: 2.0000 | INHALATION_SPRAY | Freq: Every day | RESPIRATORY_TRACT | 5 refills | Status: DC

## 2016-07-21 ENCOUNTER — Ambulatory Visit (INDEPENDENT_AMBULATORY_CARE_PROVIDER_SITE_OTHER): Payer: Medicare Other | Admitting: Pulmonary Disease

## 2016-07-21 ENCOUNTER — Ambulatory Visit (INDEPENDENT_AMBULATORY_CARE_PROVIDER_SITE_OTHER)
Admission: RE | Admit: 2016-07-21 | Discharge: 2016-07-21 | Disposition: A | Discharge status: Home / Self Care | Payer: Medicare Other | Source: Ambulatory Visit | Attending: Pulmonary Disease | Admitting: Pulmonary Disease

## 2016-07-21 ENCOUNTER — Encounter: Payer: Self-pay | Admitting: Pulmonary Disease

## 2016-07-21 DIAGNOSIS — Z23 Encounter for immunization: Secondary | ICD-10-CM | POA: Diagnosis not present

## 2016-07-21 DIAGNOSIS — R0602 Shortness of breath: Secondary | ICD-10-CM

## 2016-07-21 DIAGNOSIS — J90 Pleural effusion, not elsewhere classified: Secondary | ICD-10-CM

## 2016-07-21 DIAGNOSIS — J430 Unilateral pulmonary emphysema [MacLeod's syndrome]: Secondary | ICD-10-CM

## 2016-07-21 NOTE — Progress Notes (Signed)
Subjective:    Patient ID: [name], female    DOB: 02/07/1932, 82 y.o.   MRN: [id number]  HPI   Pt  [name], 82 y.o. Female was referred by Dr. Lance MussJayadeep Varanasi  in consultation regarding her dyspnea and pleural effusion in 12/2015. Has been dxed with moderate COPD and her L effusion  has been stable.   Patient is a non smoker but was exposed to husband who was smoking. She was also exposed to burning wood growing up. Husband smoked for 37 yrs.  Pt had CABG in 2007 and she follows with cardiology.   ROV (01/14/16)  PFT done showed FEV1 ratio was 37%. FEV1 of 0.99 or 65%. Patient is clinically improved with Spiriva. Has issues using Spiriva sometimes. Overall better with Spiriva. Has not been admitted now been on antibiotics since last seen.  ROV 07/21/16 Patient follows up on her COPD. No change in her dyspnea the last 6 months. She is able to do her usual ADLs without being too short of breath. Whenever she do some more than her ADLs such as vacuuming and playing with the dog, she gets winded but this gets better with rest. She uses Spiriva daily. Has not been admitted nor been on antibiotics recently. Follows with cardiologist.  Review of Systems  Constitutional: Negative.  Negative for fever and unexpected weight change.  HENT: Positive for postnasal drip. Negative for congestion, dental problem, ear pain, nosebleeds, rhinorrhea, sinus pressure, sneezing, sore throat and trouble swallowing.   Eyes: Positive for redness and itching.  Respiratory: Positive for cough and shortness of breath. Negative for chest tightness, wheezing and stridor.   Cardiovascular: Negative.  Negative for chest pain, palpitations and leg swelling.  Gastrointestinal: Negative.  Negative for nausea and vomiting.  Endocrine: Negative.   Genitourinary: Negative.  Negative for dysuria.  Musculoskeletal: Positive for arthralgias. Negative for joint swelling.  Skin: Negative.  Negative for rash.    Allergic/Immunologic: Negative.   Neurological: Negative.  Negative for headaches.  Hematological: Negative.  Does not bruise/bleed easily.  Psychiatric/Behavioral: Negative.  Negative for dysphoric mood. The patient is not nervous/anxious.   All other systems reviewed and are negative.       Objective:   Physical Exam   Vitals:  Vitals:   07/21/16 1204  BP: 118/72  Pulse: (!) 56  SpO2: 96%  Weight: 121 lb (54.9 kg)  Height: 5\' 2"  (1.575 m)    Constitutional/General:  Pleasant, well-nourished, well-developed, not in any distress,  Comfortably seating.  Well kempt  Body mass index is 22.13 kg/m.  HEENT: Pupils equal and reactive to light and accommodation. Anicteric sclerae. Normal nasal mucosa.   No oral  lesions,  mouth clear,  oropharynx clear, no postnasal drip. (-) Oral thrush. No dental caries.  Airway - Mallampati class III  Neck: No masses. Midline trachea. No JVD, (-) LAD. (-) bruits appreciated.  Respiratory/Chest: Grossly normal chest. (-) deformity. (-) Accessory muscle use.  Symmetric expansion. (-) Tenderness on palpation.  Resonant on percussion.  Diminished BS on both lower lung zones. (-) wheezing, crackles, rhonchi (-) egophony  Cardiovascular: Regular rate and  rhythm, heart sounds normal, no murmur or gallops, no peripheral edema  Gastrointestinal:  Normal bowel sounds. Soft, non-tender. No hepatosplenomegaly.  (-) masses.   Musculoskeletal:  Normal muscle tone. Normal gait.   Extremities: Grossly normal. (-) clubbing, cyanosis.  (-) edema  Skin: (-) rash,lesions seen.   Neurological/Psychiatric : alert, oriented to time, place, person. Normal mood  and affect           Assessment & Plan:  COPD (chronic obstructive pulmonary disease) (HCC) PFT (February 2017) FEV1 ratio was 37%. FEV1 0.99 or 65%. Diffusion was 68%. Patient clinically improved with Spiriva. She wants to avoid medicines.  Plan: #1. Continue Spiriva respimat, 2  puffs daily.  #2. I wanted to prescribe albuterol but she does not want to. Told her to call the office if with worsening dyspnea. I wanted to see whether inhaled steroids and a  LABA combination will make her breathe better. She wanted to hold off unless she gets worse. She says she is stable and did not necessarily want to change medicines. #3. Patient has chronic atrial fibrillation but is controlled. Avoiding beta agonists. #4. Needs influenza vaccine today.   Shortness of breath Patient is a nonsmoker but was exposed to burning wood growing up and also to husband who chronically smoke. Chest CT scan (December/2016): Mild COPD changes. Chest x-ray (October/2007): Mild COPD changes. This was done after the bypass. PFT (February 2017) moderate COPD. Patient clinically improved with Spiriva. She wants to avoid medicines.  Plan: #1. Continue Spiriva respimat, 2 puffs daily.  #2. I wanted to prescribe albuterol but she does not want to. Told her to call the office if with worsening dyspnea. I wanted to see whether inhaled steroids and a  LABA combination will make her breathe better. She wanted to hold off unless she gets worse. #3. Patient has chronic atrial fibrillation but is controlled. Avoiding beta agonists. #4. She is up-to-date with influenza and pneumonia vaccine.    Pleural effusion Patient with recent shortness of breath. She gets dyspneic with more than ADLs. Started in December 2016. Chest x-ray in December showed small pleural effusion. Chest CT scan 10/31/2015-mild COPD changes. Small right pleural effusion, atelectasis versus scar tissue over the right lung base, lateral. Minimal effusion on the left lung. Chest x-ray done in October 2007-minimal to small left pleural effusion. All images were personally reviewed. Dyspnea better with Spiriva.  Plan: #1. Repeat chest x-ray 07/2016 >> need to call patient with results. Appears chronic effusion.    Patient will follow up  with me in 6 mos, sooner if with issues.    Marisa Sell A. Christene Slates, MD Pulmonary and Critical Care Medicine [facility] [phone], Fax: [phone]

## 2016-07-21 NOTE — Assessment & Plan Note (Addendum)
PFT (February 2017) FEV1 ratio was 37%. FEV1 0.99 or 65%. Diffusion was 68%. Patient clinically improved with Spiriva. She wants to avoid medicines.  Plan: #1. Continue Spiriva respimat, 2 puffs daily.  #2. I wanted to prescribe albuterol but she does not want to. Told her to call the office if with worsening dyspnea. I wanted to see whether inhaled steroids and a  LABA combination will make her breathe better. She wanted to hold off unless she gets worse. She says she is stable and did not necessarily want to change medicines. #3. Patient has chronic atrial fibrillation but is controlled. Avoiding beta agonists. #4. Needs influenza vaccine today.

## 2016-07-21 NOTE — Assessment & Plan Note (Signed)
Patient is a nonsmoker but was exposed to burning wood growing up and also to husband who chronically smoke. Chest CT scan (December/2016): Mild COPD changes. Chest x-ray (October/2007): Mild COPD changes. This was done after the bypass. PFT (February 2017) moderate COPD. Patient clinically improved with Spiriva. She wants to avoid medicines.  Plan: #1. Continue Spiriva respimat, 2 puffs daily.  #2. I wanted to prescribe albuterol but she does not want to. Told her to call the office if with worsening dyspnea. I wanted to see whether inhaled steroids and a  LABA combination will make her breathe better. She wanted to hold off unless she gets worse. #3. Patient has chronic atrial fibrillation but is controlled. Avoiding beta agonists. #4. She is up-to-date with influenza and pneumonia vaccine.

## 2016-07-21 NOTE — Patient Instructions (Signed)
It was a pleasure taking care of you today!  You are diagnosed with Chronic Obstructive Pulmonary Disease or COPD.  COPD is a preventable and treatable disease that makes it difficult to empty air out of the lungs (airflow obstruction).  This can lead to shortness of breath.   Sometimes, when you have a lung infection, this can make your breathing worse, and will cause you to have a COPD flare-up or an acute exacerbation of COPD. Please call your primary care doctor or the office if you are having a COPD flare-up.   Smoking makes COPD worse.   Make sure you use your medications for COPD -- Maintenance medications : Spiriva 2 puffs daily.   Please call the office if you are having issues with your medications  We will get a chest x-ray today. We will call you with results of your chest x-ray.  Return to clinic in 6 months.

## 2016-07-21 NOTE — Assessment & Plan Note (Addendum)
Patient with recent shortness of breath. She gets dyspneic with more than ADLs. Started in December 2016. Chest x-ray in December showed small pleural effusion. Chest CT scan 10/31/2015-mild COPD changes. Small right pleural effusion, atelectasis versus scar tissue over the right lung base, lateral. Minimal effusion on the left lung. Chest x-ray done in October 2007-minimal to small left pleural effusion. All images were personally reviewed. Dyspnea better with Spiriva.  Plan: #1. Repeat chest x-ray 07/2016 >> need to call patient with results. Appears chronic effusion.

## 2016-07-27 ENCOUNTER — Telehealth: Payer: Self-pay

## 2016-07-27 NOTE — Telephone Encounter (Signed)
**Note De-Identified [name] Obfuscation** The pt needs to have a Lumbar Epidural Steroid Injection, Left L3-4 and left L4-5. They are requesting: 1. Cardiac clearance 2. Can the pt hold Eliquis prior to procedure and if so when should she stop taking.  Please advise.

## 2016-07-27 NOTE — Telephone Encounter (Signed)
Patient takes Eliquis for afib, CHADS2 score of 2 (age and HTN), no history of stroke. Ok to hold Eliquis for 3 days prior to spinal injection. Pt made aware and clearance faxed to [phone] attn Danielle.

## 2016-07-27 NOTE — Telephone Encounter (Signed)
Per pharmD protocol. 

## 2016-07-29 ENCOUNTER — Telehealth: Payer: Self-pay | Admitting: Interventional Cardiology

## 2016-07-29 NOTE — Telephone Encounter (Signed)
New Message  Pt c/o medication issue:  1. Name of Medication: Eliquis  2. How are you currently taking this medication (dosage and times per day)? 5 mg tablet twice daily  3. Are you having a reaction (difficulty breathing--STAT)? No  4. What is your medication issue? Pt wants to know she's aware of being off Eliquis for 5 days for her injection but pt wants to know how long afterwards does she need to stay off the medication.  Please f/u

## 2016-07-29 NOTE — Telephone Encounter (Signed)
**Note De-Identified Sujey Gundry Obfuscation** The pt is advised that she should resume Eliquis at the discretion of [facility]. She verbalized understanding and thanked me for my assistance.

## 2016-08-19 ENCOUNTER — Telehealth: Payer: Self-pay | Admitting: Interventional Cardiology

## 2016-08-19 DIAGNOSIS — R42 Dizziness and giddiness: Secondary | ICD-10-CM

## 2016-08-19 DIAGNOSIS — Z79899 Other long term (current) drug therapy: Secondary | ICD-10-CM

## 2016-08-19 DIAGNOSIS — I959 Hypotension, unspecified: Secondary | ICD-10-CM

## 2016-08-19 NOTE — Telephone Encounter (Addendum)
Reports SBPs ranging from 91-111.  States the only symptom she is experiencing is dizziness.  Denies any new/increased medications.  Denies any recent illness.  Reviewed with DOD, Dr. Tenny Crawoss -- orders for BMET/CBCD/BNP/TSH. Hold Lasix until get bloodwork results. Will determine next step after blood work. Patient verbalized understanding and agreeable to plan.

## 2016-08-19 NOTE — Telephone Encounter (Signed)
New Message:    Please call,pt says she have been dizzy and her blood pressure have been running low all week.She says she does not know whether she needs to be seen or not.

## 2016-08-20 ENCOUNTER — Other Ambulatory Visit: Payer: Medicare Other | Admitting: *Deleted

## 2016-08-20 DIAGNOSIS — R42 Dizziness and giddiness: Secondary | ICD-10-CM

## 2016-08-20 DIAGNOSIS — I959 Hypotension, unspecified: Secondary | ICD-10-CM

## 2016-08-20 DIAGNOSIS — Z79899 Other long term (current) drug therapy: Secondary | ICD-10-CM

## 2016-08-20 LAB — CBC WITH DIFFERENTIAL/PLATELET
BASOS ABS: 54 {cells}/uL (ref 0–200)
Basophils Relative: 1 %
EOS ABS: 162 {cells}/uL (ref 15–500)
Eosinophils Relative: 3 %
HEMATOCRIT: 32.3 % — AB (ref 35.0–45.0)
HEMOGLOBIN: 11 g/dL — AB (ref 11.7–15.5)
LYMPHS ABS: 1350 {cells}/uL (ref 850–3900)
LYMPHS PCT: 25 %
MCH: 28.7 pg (ref 27.0–33.0)
MCHC: 34.1 g/dL (ref 32.0–36.0)
MCV: 84.3 fL (ref 80.0–100.0)
MONO ABS: 702 {cells}/uL (ref 200–950)
MPV: 10.9 fL (ref 7.5–12.5)
Monocytes Relative: 13 %
NEUTROS PCT: 58 %
Neutro Abs: 3132 cells/uL (ref 1500–7800)
Platelets: 113 10*3/uL — ABNORMAL LOW (ref 140–400)
RBC: 3.83 MIL/uL (ref 3.80–5.10)
RDW: 15.2 % — ABNORMAL HIGH (ref 11.0–15.0)
WBC: 5.4 10*3/uL (ref 3.8–10.8)

## 2016-08-20 LAB — BRAIN NATRIURETIC PEPTIDE: Brain Natriuretic Peptide: 194.8 pg/mL — ABNORMAL HIGH (ref <100)

## 2016-08-20 LAB — BASIC METABOLIC PANEL
BUN: 12 mg/dL (ref 7–25)
CHLORIDE: 108 mmol/L (ref 98–110)
CO2: 23 mmol/L (ref 20–31)
Calcium: 9.2 mg/dL (ref 8.6–10.4)
Creat: 0.83 mg/dL (ref 0.60–0.88)
GLUCOSE: 99 mg/dL (ref 65–99)
POTASSIUM: 4.2 mmol/L (ref 3.5–5.3)
SODIUM: 141 mmol/L (ref 135–146)

## 2016-08-20 LAB — TSH: TSH: 1.3 m[IU]/L

## 2016-08-23 NOTE — Progress Notes (Signed)
Any update?

## 2016-10-05 ENCOUNTER — Other Ambulatory Visit: Payer: Self-pay | Admitting: Interventional Cardiology

## 2016-10-05 DIAGNOSIS — I4891 Unspecified atrial fibrillation: Secondary | ICD-10-CM

## 2016-10-05 DIAGNOSIS — I10 Essential (primary) hypertension: Secondary | ICD-10-CM

## 2016-11-25 ENCOUNTER — Other Ambulatory Visit: Payer: Self-pay | Admitting: Gastroenterology

## 2016-11-26 ENCOUNTER — Other Ambulatory Visit: Payer: Self-pay | Admitting: Gastroenterology

## 2016-11-30 ENCOUNTER — Encounter (HOSPITAL_COMMUNITY): Payer: Self-pay

## 2016-11-30 ENCOUNTER — Ambulatory Visit (HOSPITAL_COMMUNITY): Payer: Medicare Other | Admitting: Anesthesiology

## 2016-11-30 ENCOUNTER — Ambulatory Visit (HOSPITAL_COMMUNITY)
Admission: RE | Admit: 2016-11-30 | Discharge: 2016-11-30 | Disposition: A | Discharge status: Home / Self Care | Payer: Medicare Other | Source: Ambulatory Visit | Attending: Gastroenterology | Admitting: Gastroenterology

## 2016-11-30 ENCOUNTER — Encounter (HOSPITAL_COMMUNITY)
Admission: RE | Disposition: A | Discharge status: Home / Self Care | Payer: Self-pay | Source: Ambulatory Visit | Attending: Gastroenterology

## 2016-11-30 DIAGNOSIS — I2510 Atherosclerotic heart disease of native coronary artery without angina pectoris: Secondary | ICD-10-CM | POA: Diagnosis not present

## 2016-11-30 DIAGNOSIS — I10 Essential (primary) hypertension: Secondary | ICD-10-CM | POA: Insufficient documentation

## 2016-11-30 DIAGNOSIS — K635 Polyp of colon: Secondary | ICD-10-CM | POA: Diagnosis not present

## 2016-11-30 DIAGNOSIS — Z7901 Long term (current) use of anticoagulants: Secondary | ICD-10-CM | POA: Diagnosis not present

## 2016-11-30 DIAGNOSIS — I482 Chronic atrial fibrillation: Secondary | ICD-10-CM | POA: Insufficient documentation

## 2016-11-30 DIAGNOSIS — Z951 Presence of aortocoronary bypass graft: Secondary | ICD-10-CM | POA: Diagnosis not present

## 2016-11-30 DIAGNOSIS — R195 Other fecal abnormalities: Secondary | ICD-10-CM | POA: Diagnosis not present

## 2016-11-30 DIAGNOSIS — E039 Hypothyroidism, unspecified: Secondary | ICD-10-CM | POA: Insufficient documentation

## 2016-11-30 DIAGNOSIS — D500 Iron deficiency anemia secondary to blood loss (chronic): Secondary | ICD-10-CM | POA: Insufficient documentation

## 2016-11-30 DIAGNOSIS — E7800 Pure hypercholesterolemia, unspecified: Secondary | ICD-10-CM | POA: Diagnosis not present

## 2016-11-30 HISTORY — PX: COLONOSCOPY WITH PROPOFOL: SHX5780

## 2016-11-30 HISTORY — PX: ESOPHAGOGASTRODUODENOSCOPY: SHX5428

## 2016-11-30 SURGERY — COLONOSCOPY WITH PROPOFOL
Anesthesia: Monitor Anesthesia Care

## 2016-11-30 MED ORDER — LACTATED RINGERS IV SOLN
INTRAVENOUS | Status: DC | PRN
  Administered 2016-11-30: 10:00:00 via INTRAVENOUS

## 2016-11-30 MED ORDER — PROPOFOL 10 MG/ML IV BOLUS
INTRAVENOUS | Status: AC
  Filled 2016-11-30: qty 20

## 2016-11-30 MED ORDER — PROPOFOL 10 MG/ML IV BOLUS
INTRAVENOUS | Status: DC | PRN
  Administered 2016-11-30: 40 mg via INTRAVENOUS
  Administered 2016-11-30 (×6): 20 mg via INTRAVENOUS
  Administered 2016-11-30: 40 mg via INTRAVENOUS
  Administered 2016-11-30: 20 mg via INTRAVENOUS

## 2016-11-30 MED ORDER — LACTATED RINGERS IV SOLN
INTRAVENOUS | Status: DC
  Administered 2016-11-30: 1000 mL via INTRAVENOUS

## 2016-11-30 SURGICAL SUPPLY — 22 items

## 2016-11-30 NOTE — Transfer of Care (Signed)
Immediate Anesthesia Transfer of Care Note  Patient: [name]  Procedure(s) Performed: Procedure(s): COLONOSCOPY WITH PROPOFOL (N/A) ESOPHAGOGASTRODUODENOSCOPY (EGD) (N/A)  Patient Location: PACU and Endoscopy Unit  Anesthesia Type:MAC  Level of Consciousness: awake, oriented, patient cooperative, lethargic and responds to stimulation  Airway & Oxygen Therapy: Patient Spontanous Breathing and Patient connected to nasal cannula oxygen  Post-op Assessment: Report given to RN, Post -op Vital signs reviewed and stable and Patient moving all extremities  Post vital signs: Reviewed and stable  Last Vitals:  Vitals:   11/30/16 0934  BP: 124/67  Pulse: 75  Resp: 15  Temp: 36.5 C    Last Pain:  Vitals:   11/30/16 0934  TempSrc: Oral         Complications: No apparent anesthesia complications

## 2016-11-30 NOTE — H&P (Signed)
Problem: Iron deficiency anemia with heme positive stool on eliquis anticoagulation for atrial fibrillation. 11/25/2016 white blood cell count was 5300, hemoglobin was 11.6 g, platelet count was 119,000. 09/09/2008 normal screening colonoscopy was performed  History: The patient is an 83 year old female born 05/06/1932. She has developed iron deficiency anemia and heme positive stool on eliquis anticoagulation used to treat chronic atrial fibrillation. The patient reports normal bowel movements unassociated with gastrointestinal bleeding. She does not take nonsteroidal anti-inflammatory medication.  She is scheduled to undergo diagnostic esophagogastroduodenoscopy and colonoscopy today. She stopped taking eliquis for days ago.  Past medical history: Coronary artery disease. Coronary artery bypass grafting. Hypertension. Atrial fibrillation. Hypercholesterolemia. Hypothyroidism. Osteoarthritis. Hysterectomy. Gastroesophageal reflux. Appendectomy. Rotator cuff surgery. Tear duct surgery.  Medication allergies: Sulfa drugs  Exam: The patient is alert and lying comfortably on the endoscopy stretcher. Abdomen is soft and nontender to palpation. Cardiac exam reveals an irregular rhythm consistent with atrial fibrillation. Lungs are clear to auscultation.  Plan: Proceed with diagnostic esophagogastroduodenoscopy and colonoscopy to evaluate iron deficiency anemia and heme positive stool

## 2016-11-30 NOTE — Op Note (Signed)
[facility] Patient Name: [name] Procedure Date: 11/30/2016 MRN: [id number] Attending MD: Charolett Bumpers , MD Date of Birth: 07/12/1932 CSN: [id number] Age: 83 Admit Type: Outpatient Procedure:                Colonoscopy Indications:              Iron deficiency anemia secondary to chronic blood                            loss: 10/29/2015 hemoglobin was 10.7 g and serum                            iron saturation was 12%. 10/05/2016 hemoglobin was                            10.3 g and serum iron saturation was 8%. 11/25/2016                            hemoglobin was 11.6 g. Providers:                Charolett Bumpers, MD, Tillie Fantasia, RN, Arlee [employer] Tech., Technician, Phillips Grout, CRNA Referring MD:              Medicines:                Propofol per Anesthesia Complications:            No immediate complications. Estimated Blood Loss:     Estimated blood loss was minimal. Procedure:                Pre-Anesthesia Assessment:                           - Prior to the procedure, a History and Physical                            was performed, and patient medications and                            allergies were reviewed. The patient's tolerance of                            previous anesthesia was also reviewed. The risks                            and benefits of the procedure and the sedation                            options and risks were discussed with the patient.                            All questions were answered, and informed consent  was obtained. Prior Anticoagulants: The patient has                            taken Eliquis (apixaban), last dose was 5 days                            prior to procedure. ASA Grade Assessment: III - A                            patient with severe systemic disease. After                            reviewing the risks and benefits, the patient was              deemed in satisfactory condition to undergo the                            procedure.                           - Prior to the procedure, a History and Physical                            was performed, and patient medications and                            allergies were reviewed. The patient's tolerance of                            previous anesthesia was also reviewed. The risks                            and benefits of the procedure and the sedation                            options and risks were discussed with the patient.                            All questions were answered, and informed consent                            was obtained. Prior Anticoagulants: The patient has                            taken Eliquis (apixaban), last dose was 5 days                            prior to procedure. ASA Grade Assessment: III - A                            patient with severe systemic disease. After                            reviewing  the risks and benefits, the patient was                            deemed in satisfactory condition to undergo the                            procedure.                           After obtaining informed consent, the colonoscope                            was passed under direct vision. Throughout the                            procedure, the patient's blood pressure, pulse, and                            oxygen saturations were monitored continuously. The                            EC-[id number] ([id number]) scope was introduced through                            the anus and advanced to the the cecum, identified                            by appendiceal orifice and ileocecal valve. The                            colonoscopy was performed without difficulty. The                            patient tolerated the procedure well. The quality                            of the bowel preparation was good. The terminal                            ileum, the  ileocecal valve, the appendiceal orifice                            and the rectum were photographed. Scope In: 10:08:25 AM Scope Out: 10:27:21 AM Scope Withdrawal Time: 0 hours 13 minutes 6 seconds  Total Procedure Duration: 0 hours 18 minutes 56 seconds  Findings:      The perianal and digital rectal examinations were normal.      A 5 mm polyp was found in the mid sigmoid colon. The polyp was sessile.       The polyp was removed with a cold snare. Resection and retrieval were       complete. An endoclip was applied to the polypectomy site.      The exam was otherwise without abnormality. Impression:               - One 5 mm polyp  in the mid sigmoid colon, removed                            with a cold snare. Resected and retrieved.                           - The examination was otherwise normal. Moderate Sedation:      N/A- Per Anesthesia Care Recommendation:           - Patient has a contact number available for                            emergencies. The signs and symptoms of potential                            delayed complications were discussed with the                            patient. Return to normal activities tomorrow.                            Written discharge instructions were provided to the                            patient.                           - Repeat colonoscopy is not recommended for                            surveillance.                           - Resume previous diet.                           - Continue present medications. Procedure Code(s):        --- Professional ---                           [phone], Colonoscopy, flexible; with removal of                            tumor(s), polyp(s), or other lesion(s) by snare                            technique Diagnosis Code(s):        --- Professional ---                           D12.5, Benign neoplasm of sigmoid colon                           D50.0, Iron deficiency anemia secondary to blood                             loss (chronic) CPT copyright  2016 American [employer]. All rights reserved. The codes documented in this report are preliminary and upon coder review may  be revised to meet current compliance requirements. Danise Edge, MD Charolett Bumpers, MD 11/30/2016 10:38:57 AM This report has been signed electronically. Number of Addenda: 0

## 2016-11-30 NOTE — Anesthesia Preprocedure Evaluation (Signed)
Anesthesia Evaluation  Patient identified by MRN, date of birth, ID band Patient awake    Reviewed: Allergy & Precautions, NPO status , Patient's Chart, lab work & pertinent test results  Airway Mallampati: II  TM Distance: >3 FB Neck ROM: Full    Dental no notable dental hx.    Pulmonary neg pulmonary ROS,    Pulmonary exam normal breath sounds clear to auscultation       Cardiovascular hypertension, + CAD and + CABG  Normal cardiovascular exam+ dysrhythmias Atrial Fibrillation  Rhythm:Regular Rate:Normal     Neuro/Psych negative neurological ROS  negative psych ROS   GI/Hepatic negative GI ROS, Neg liver ROS,   Endo/Other  negative endocrine ROS  Renal/GU negative Renal ROS  negative genitourinary   Musculoskeletal negative musculoskeletal ROS (+)   Abdominal   Peds negative pediatric ROS (+)  Hematology negative hematology ROS (+)   Anesthesia Other Findings   Reproductive/Obstetrics negative OB ROS                             Anesthesia Physical Anesthesia Plan  ASA: III  Anesthesia Plan: MAC   Post-op Pain Management:    Induction: Intravenous  Airway Management Planned: Nasal Cannula  Additional Equipment:   Intra-op Plan:   Post-operative Plan:   Informed Consent: I have reviewed the patients History and Physical, chart, labs and discussed the procedure including the risks, benefits and alternatives for the proposed anesthesia with the patient or authorized representative who has indicated his/her understanding and acceptance.   Dental advisory given  Plan Discussed with: CRNA and Surgeon  Anesthesia Plan Comments:         Anesthesia Quick Evaluation

## 2016-11-30 NOTE — Discharge Instructions (Signed)

## 2016-11-30 NOTE — Op Note (Signed)
[facility] Patient Name: [name] Procedure Date: 11/30/2016 MRN: [id number] Attending MD: Charolett Bumpers , MD Date of Birth: 1932/07/11 CSN: [id number] Age: 83 Admit Type: Outpatient Procedure:                Upper GI endoscopy Indications:              Iron deficiency anemia secondary to chronic blood                            loss Providers:                Charolett Bumpers, MD, Tillie Fantasia, RN, [facility], Technician, Phillips Grout, CRNA Referring MD:              Medicines:                Propofol per Anesthesia Complications:            No immediate complications. Estimated Blood Loss:     Estimated blood loss: none. Procedure:                Pre-Anesthesia Assessment:                           - Prior to the procedure, a History and Physical                            was performed, and patient medications and                            allergies were reviewed. The patient's tolerance of                            previous anesthesia was also reviewed. The risks                            and benefits of the procedure and the sedation                            options and risks were discussed with the patient.                            All questions were answered, and informed consent                            was obtained. Prior Anticoagulants: The patient has                            taken Eliquis (apixaban), last dose was 5 days                            prior to procedure. ASA Grade Assessment: III - A  patient with severe systemic disease. After                            reviewing the risks and benefits, the patient was                            deemed in satisfactory condition to undergo the                            procedure.                           After obtaining informed consent, the endoscope was                            passed under direct vision. Throughout the                         procedure, the patient's blood pressure, pulse, and                            oxygen saturations were monitored continuously. The                            was introduced through the mouth, and advanced to                            the second part of duodenum. The upper GI endoscopy                            was accomplished without difficulty. The patient                            tolerated the procedure well. Scope In: Scope Out: Findings:      The Z-line was regular and was found 37 cm from the incisors.      The examined esophagus was normal.      The entire examined stomach was normal.      The examined duodenum was normal. Impression:               - Z-line regular, 37 cm from the incisors.                           - Normal esophagus.                           - Normal stomach.                           - Normal examined duodenum.                           - No specimens collected. Moderate Sedation:      N/A- Per Anesthesia Care Recommendation:           - Patient has a contact number available for  emergencies. The signs and symptoms of potential                            delayed complications were discussed with the                            patient. Return to normal activities tomorrow.                            Written discharge instructions were provided to the                            patient.                           - Return to primary care physician PRN.                           - Resume previous diet.                           - Continue present medications. Procedure Code(s):        --- Professional ---                           [phone], Esophagogastroduodenoscopy, flexible,                            transoral; diagnostic, including collection of                            specimen(s) by brushing or washing, when performed                            (separate procedure) Diagnosis Code(s):        ---  Professional ---                           D50.0, Iron deficiency anemia secondary to blood                            loss (chronic) CPT copyright 2016 American [employer] Association. All rights reserved. The codes documented in this report are preliminary and upon coder review may  be revised to meet current compliance requirements. Danise Edge, MD Charolett Bumpers, MD 11/30/2016 10:33:13 AM This report has been signed electronically. Number of Addenda: 0

## 2016-11-30 NOTE — Anesthesia Postprocedure Evaluation (Addendum)
Anesthesia Post Note  Patient: [name]  Procedure(s) Performed: Procedure(s) (LRB): COLONOSCOPY WITH PROPOFOL (N/A) ESOPHAGOGASTRODUODENOSCOPY (EGD) (N/A)  Patient location during evaluation: PACU Anesthesia Type: MAC Level of consciousness: awake and alert Pain management: pain level controlled Vital Signs Assessment: post-procedure vital signs reviewed and stable Respiratory status: spontaneous breathing, nonlabored ventilation, respiratory function stable and patient connected to nasal cannula oxygen Cardiovascular status: stable and blood pressure returned to baseline Anesthetic complications: no       Last Vitals:  Vitals:   11/30/16 0934 11/30/16 1037  BP: 124/67 127/61  Pulse: 75 68  Resp: 15 17  Temp: 36.5 C 36.4 C    Last Pain:  Vitals:   11/30/16 1037  TempSrc: Oral                 Filippo Puls S

## 2016-12-01 ENCOUNTER — Encounter (HOSPITAL_COMMUNITY): Payer: Self-pay | Admitting: Gastroenterology

## 2016-12-17 ENCOUNTER — Ambulatory Visit
Admission: RE | Admit: 2016-12-17 | Discharge: 2016-12-17 | Disposition: A | Discharge status: Home / Self Care | Payer: Medicare Other | Source: Ambulatory Visit | Attending: Internal Medicine | Admitting: Internal Medicine

## 2016-12-17 ENCOUNTER — Other Ambulatory Visit: Payer: Self-pay | Admitting: Internal Medicine

## 2016-12-17 DIAGNOSIS — M1990 Unspecified osteoarthritis, unspecified site: Secondary | ICD-10-CM

## 2017-02-01 ENCOUNTER — Other Ambulatory Visit: Payer: Self-pay

## 2017-02-01 MED ORDER — TIOTROPIUM BROMIDE MONOHYDRATE 2.5 MCG/ACT IN AERS: 2.0000 | INHALATION_SPRAY | Freq: Every day | RESPIRATORY_TRACT | 5 refills | Status: DC

## 2017-03-10 ENCOUNTER — Encounter: Payer: Self-pay | Admitting: Interventional Cardiology

## 2017-03-18 ENCOUNTER — Encounter: Payer: Self-pay | Admitting: Interventional Cardiology

## 2017-03-18 ENCOUNTER — Encounter (INDEPENDENT_AMBULATORY_CARE_PROVIDER_SITE_OTHER): Payer: Self-pay

## 2017-03-18 ENCOUNTER — Ambulatory Visit (INDEPENDENT_AMBULATORY_CARE_PROVIDER_SITE_OTHER): Payer: Medicare Other | Admitting: Interventional Cardiology

## 2017-03-18 VITALS — BP 120/78 | HR 76 | Ht 62.5 in | Wt 117.0 lb

## 2017-03-18 DIAGNOSIS — I10 Essential (primary) hypertension: Secondary | ICD-10-CM | POA: Diagnosis not present

## 2017-03-18 DIAGNOSIS — I25119 Atherosclerotic heart disease of native coronary artery with unspecified angina pectoris: Secondary | ICD-10-CM | POA: Diagnosis not present

## 2017-03-18 DIAGNOSIS — I482 Chronic atrial fibrillation, unspecified: Secondary | ICD-10-CM

## 2017-03-18 DIAGNOSIS — E782 Mixed hyperlipidemia: Secondary | ICD-10-CM

## 2017-03-18 NOTE — Patient Instructions (Signed)

## 2017-03-18 NOTE — Progress Notes (Signed)
Cardiology Office Note   Date:  03/18/2017   ID:  [name], DOB 1931-12-28, MRN [id number]  PCP:  Lorenda Ishihara, MD    No chief complaint on file. f/u CAD   Wt Readings from Last 3 Encounters:  03/18/17 117 lb (53.1 kg)  11/30/16 120 lb (54.4 kg)  07/21/16 121 lb (54.9 kg)       History of Present Illness: [name] is a 83 y.o. female  who had CABG in 2007 for left main disease, by Dr. Dorris Fetch. She had hysterectomy in May 2012 and did well.    Her husband passed away a few years ago.  She goes bowling without problems. No sx like what she had before CABG. c/o Shortness of breath when she square dances. Resolves after a little rest, but she rarely has to stopped.  Breathing better after starting Eliquis and inhaler was started.Less sx with bending down.   At time of CABG, she had [facility] with any activity.  She has had some hip pain which has limited her walking and dancing. She has a fair amount of pain.  Very little relief from the shot she had in her hip.   In Jan 2018, she had a colonoscopy that was ok.      Past Medical History:  Diagnosis Date  . Chronic atrial fibrillation (HCC)    a. Pt declined cardioversion.  . Coronary artery disease    a. s/p CABGx2 06/2006 (LIMA-LAD, SVG-LCx at bifurcation of OM II/III). b. Nuc 11/2012: no evidence of ischemia, attenuation artifact c/w breast attenuation in anterior region, essentially low risk, EF 88%.  . Essential hypertension   . Hyperlipidemia   . Hypothyroidism   . Insomnia    on ambien, failed other meds  . Iron deficiency anemia   . Osteoarthritis   . Osteopenia    involved on BMD 06/01/07 (T score-1.6); slightly worse in 3/13 (T score -1.6; FRAX 3%  and 12%)-plan rechcekin 2016  . Uterine prolapse    post hysterectomy, Dr. Vincente Poli  . UTI (lower urinary tract infection) 3/11    Past Surgical History:  Procedure Laterality Date  . APPENDECTOMY    . CATARACT EXTRACTION, BILATERAL   2009  . COLONOSCOPY WITH PROPOFOL N/A 11/30/2016   Procedure: COLONOSCOPY WITH PROPOFOL;  Surgeon: Charolett Bumpers, MD;  Location: [facility];  Service: Endoscopy;  Laterality: N/A;  . CORONARY ARTERY BYPASS GRAFT  2007  . ESOPHAGOGASTRODUODENOSCOPY N/A 11/30/2016   Procedure: ESOPHAGOGASTRODUODENOSCOPY (EGD);  Surgeon: Charolett Bumpers, MD;  Location: [facility];  Service: Endoscopy;  Laterality: N/A;  . LIPOMA EXCISION    . OVARIAN CYST REMOVAL    . ROTATOR CUFF REPAIR    . TEAR DUCT PROBING     X2 on left and x1 on right     Current Outpatient Prescriptions  Medication Sig Dispense Refill  . acetaminophen (TYLENOL) 500 MG tablet Take 500-1,000 mg by mouth every 6 (six) hours as needed for mild pain.    [name] apixaban (ELIQUIS) 5 MG TABS tablet Take 1 tablet (5 mg total) by mouth 2 (two) times daily. 180 tablet 3  . Ascorbic Acid (VITAMIN C) 1000 MG tablet Take 1,000 mg by mouth daily.    [name] atorvastatin (LIPITOR) 20 MG tablet Take 1 tablet (20 mg total) by mouth daily. (Patient taking differently: Take 20 mg by mouth at bedtime. ) 90 tablet 3  . Bioflavonoid Products (ESTER-C) TABS Take 1 tablet by mouth daily.    [name]  BIOTIN PO Take 1 tablet by mouth daily.    . Calcium Carbonate-Vitamin D (CALCIUM-D PO) Take 1 tablet by mouth 2 (two) times daily.     . cholecalciferol (VITAMIN D) 1000 units tablet Take 1,000 Units by mouth daily.    . ferrous sulfate 325 (65 FE) MG tablet Take 325 mg by mouth daily with breakfast.    . furosemide (LASIX) 20 MG tablet TAKE 1 TABLET EACH DAY. 90 tablet 2  . Hypromellose (ARTIFICIAL TEARS OP) Place 1 drop into both eyes every 6 (six) hours as needed (dry eyes).    [name] levothyroxine (SYNTHROID, LEVOTHROID) 75 MCG tablet Take 37.5-75 mcg by mouth daily before breakfast. 1 tablet 6 days a week and 1/2 tab every Friday.    . metoprolol succinate (TOPROL-XL) 50 MG 24 hr tablet 1 tablet by mouth daily (Patient taking differently: Take 50 mg by mouth daily. ) 90  tablet 3  . Misc Natural Products (OSTEO BI-FLEX ADV JOINT SHIELD PO) Take 1 tablet by mouth 2 (two) times daily.     . nitroGLYCERIN (NITROSTAT) 0.4 MG SL tablet Place 0.4 mg under the tongue every 5 (five) minutes as needed for chest pain (X3 DOSES BEFORE CALLING 911).    . Omega-3 Fatty Acids (FISH OIL) 1200 MG CAPS Take 1,200 mg by mouth 2 (two) times daily.    . ranitidine (ZANTAC) 150 MG tablet Take 150 mg by mouth at bedtime.    . Tiotropium Bromide Monohydrate (SPIRIVA RESPIMAT) 2.5 MCG/ACT AERS Inhale 2 puffs into the lungs daily. 4 g 5  . vitamin B-12 (CYANOCOBALAMIN) 1000 MCG tablet Take 1,000 mcg by mouth daily.    [name] zolpidem (AMBIEN) 5 MG tablet Take 5 mg by mouth at bedtime as needed for sleep.     No current facility-administered medications for this visit.     Allergies:   Sulfa antibiotics    Social History:  The patient  reports that she has never smoked. She has never used smokeless tobacco. She reports that she does not drink alcohol or use drugs.   Family History:  The patient's family history includes CAD in her father; Cancer in her brother; Diabetes in her brother and father; Heart attack in her father; Heart disease in her mother; Lymphoma in her sister; Syncope episode in her mother; Ulcers in her mother.    ROS:  Please see the history of present illness.   Otherwise, review of systems are positive for hip pain.   All other systems are reviewed and negative.    PHYSICAL EXAM: VS:  BP 120/78   Pulse 76   Ht 5' 2.5" (1.588 m)   Wt 117 lb (53.1 kg)   SpO2 97%   BMI 21.06 kg/m  , BMI Body mass index is 21.06 kg/m. GEN: Well nourished, well developed, in no acute distress  HEENT: normal  Neck: no JVD, carotid bruits, or masses Cardiac: irregularly irregular; no murmurs, rubs, or gallops,no edema  Respiratory:  clear to auscultation bilaterally, normal work of breathing GI: soft, nontender, nondistended, + BS MS: no deformity or atrophy  Skin: warm and dry,  no rash Neuro:  Strength and sensation are intact Psych: euthymic mood, full affect   EKG:   The ekg ordered today demonstrates atrial fibrillation, rate controlled, HR 59   Recent Labs: 08/20/2016: Brain Natriuretic Peptide 194.8; BUN 12; Creat 0.83; Hemoglobin 11.0; Platelets 113; Potassium 4.2; Sodium 141; TSH 1.30   Lipid Panel No results found for: CHOL, TRIG, HDL,  CHOLHDL, VLDL, LDLCALC, LDLDIRECT   Other studies Reviewed: Additional studies/ records that were reviewed today with results demonstrating: Stress test and echo reviewed.   ASSESSMENT AND PLAN:  1. CAD: No clear angina on medical therapy.  Most recent stress test and echo were unremarkable.  Very rare NTG use in the past. Nothing recent. 2. AFib: Rate controlled. Eliquis for stroke prevention.  Continue Toprol.  If HR drops, could decrease Toprol dose. Feels better since being on Eliquis.  No bleeding issues. 3. DOE: She is following up with pulmonary. She is using an inhaler.   Improved.  4. Blood pressure stable in setin of HTN. Continue current medicines.  Well controlled at home.  Readings in the 120-130s systolic. 5. Hyperlipidemia: Lipids checked by Dr. Chales SalmonVaradarajan. Continue current lipid-lowering therapy.   Current medicines are reviewed at length with the patient today.  The patient concerns regarding her medicines were addressed.  The following changes have been made:  No change  Labs/ tests ordered today include:  No orders of the defined types were placed in this encounter.   Recommend 150 minutes/week of aerobic exercise Low fat, low carb, high fiber diet recommended  Disposition:   FU in 1 year   Signed, Lance MussJayadeep Tamya Denardo, MD  03/18/2017 12:13 PM    [facility] [address], [place], [place]  [address] Phone: [phone]; Fax: [phone]

## 2017-04-01 ENCOUNTER — Ambulatory Visit: Payer: Medicare Other | Admitting: Interventional Cardiology

## 2017-04-04 NOTE — Addendum Note (Signed)
Addendum  created 04/04/17 1034 by Karem Tomaso, MD   Sign clinical note    

## 2017-06-13 ENCOUNTER — Other Ambulatory Visit: Payer: Self-pay | Admitting: Interventional Cardiology

## 2017-06-13 DIAGNOSIS — I10 Essential (primary) hypertension: Secondary | ICD-10-CM

## 2017-06-13 DIAGNOSIS — I4891 Unspecified atrial fibrillation: Secondary | ICD-10-CM

## 2017-06-13 NOTE — Telephone Encounter (Signed)
Pt last saw Dr Eldridge DaceVaranasi 03/18/17, last labs 08/20/16 Creat 0.83, age 83, weight 53.1kg, based on specified criteria pt is not on appropriate dosage of Eliquis.  Pt should be taking 2.5mg  BID.  Will forward message to Dr Eldridge DaceVaranasi to address possible dosage change based on age 83>80 and weight less than 60kg.

## 2017-06-14 ENCOUNTER — Telehealth: Payer: Self-pay | Admitting: Interventional Cardiology

## 2017-06-14 MED ORDER — APIXABAN 2.5 MG PO TABS: 2.5000 mg | ORAL_TABLET | Freq: Two times a day (BID) | ORAL | 5 refills | Status: DC

## 2017-06-14 NOTE — Telephone Encounter (Signed)
-----   Message from Corky CraftsJayadeep S Varanasi, MD sent at 06/13/2017  4:31 PM EDT ----- OK to change to appropriate dose.  Thanks.  JV ----- Message ----- From: Satira SarkByrum, Kamee Bobst W, RN Sent: 06/13/2017   9:30 AM To: Corky CraftsJayadeep S Varanasi, MD  Pt last saw Dr Eldridge DaceVaranasi 03/18/17, last labs 08/20/16 Creat 0.83, age 83, weight 53.1kg, based on specified criteria pt is not on appropriate dosage of Eliquis.  Pt should be taking 2.5mg  BID.  Will forward message to Dr Eldridge DaceVaranasi to address possible dosage change based on age 9>80 and weight less than 60kg.  Please advise if you would like to decrease Eliquis to 2.5mg  BID.  Thanks.

## 2017-06-14 NOTE — Telephone Encounter (Signed)
LMOM at mobile and home number explaining the rationale of why her Eliquis dosage was changed.

## 2017-06-14 NOTE — Telephone Encounter (Signed)
Pt calling requesting a call back concerning her medication Eliquis 5 mg tablet. Pt stated that she did not know why her eliquis had been cut in half and would like someone to give her a call back to explain why. Please address. Thanks

## 2017-06-15 NOTE — Telephone Encounter (Signed)
Called pt explained rational for decreasing dosage of Eliquis from 5mg  BID to 2.5mg  BID, age 83>80, weight <60kg.  Pt verbalized understanding, appreciated explanation.

## 2017-09-19 ENCOUNTER — Telehealth: Payer: Self-pay | Admitting: Pulmonary Disease

## 2017-09-19 MED ORDER — TIOTROPIUM BROMIDE MONOHYDRATE 2.5 MCG/ACT IN AERS: 2.0000 | INHALATION_SPRAY | Freq: Every day | RESPIRATORY_TRACT | 0 refills | Status: DC

## 2017-09-19 NOTE — Telephone Encounter (Signed)
Spoke with pt and advised rx sent to pharmacy  Made an appt to see TP to f/u since Dedios is not here. Nothing further is needed.

## 2017-09-30 ENCOUNTER — Other Ambulatory Visit: Payer: Self-pay | Admitting: Acute Care

## 2017-10-17 ENCOUNTER — Ambulatory Visit: Payer: Medicare Other | Admitting: Adult Health

## 2017-10-17 ENCOUNTER — Encounter: Payer: Self-pay | Admitting: Adult Health

## 2017-10-17 DIAGNOSIS — J069 Acute upper respiratory infection, unspecified: Secondary | ICD-10-CM | POA: Diagnosis not present

## 2017-10-17 DIAGNOSIS — J449 Chronic obstructive pulmonary disease, unspecified: Secondary | ICD-10-CM | POA: Diagnosis not present

## 2017-10-17 MED ORDER — TIOTROPIUM BROMIDE MONOHYDRATE 2.5 MCG/ACT IN AERS: 2.0000 | INHALATION_SPRAY | Freq: Every day | RESPIRATORY_TRACT | 0 refills | Status: DC

## 2017-10-17 MED ORDER — TIOTROPIUM BROMIDE MONOHYDRATE 2.5 MCG/ACT IN AERS: INHALATION_SPRAY | RESPIRATORY_TRACT | 11 refills | Status: DC

## 2017-10-17 NOTE — Patient Instructions (Signed)
Continue on Spiriva 2 puffs daily .  Follow up with Dr. Isaiah SergeMannam in 1 year and As needed

## 2017-10-17 NOTE — Addendum Note (Signed)
Addended by: Boone MasterJONES, JESSICA E on: 10/17/2017 05:12 PM   Modules accepted: Orders

## 2017-10-17 NOTE — Assessment & Plan Note (Signed)
Well controlled   Plan  Patient Instructions  Continue on Spiriva 2 puffs daily .  Follow up with Dr. Isaiah SergeMannam in 1 year and As needed

## 2017-10-17 NOTE — Progress Notes (Signed)
 @Patient  ID: [name], female    DOB: 05/12/1932, 83 y.o.   MRN: [id number]  Chief Complaint  Patient presents with  . Follow-up    copd    Referring provider: Lorenda IshiharaVaradarajan, Rupashree,*  HPI: 83 yo former smoker followed for COPD   TEST 2017 PFT done showed FEV1 ratio was 37%. FEV1 of 0.99 or 65  10/17/2017 Follow up : COPD  Patient presents for a one year follow-up.  Patient has known moderate COPD.  She is on Spiriva daily.  Patient says overall her breathing is doing good .   Flu shot is utd . Had Pneumovax last week.  She has had cold like symptoms with nasal congestion and drainage. Some cough with white much . Seems to be getting better. No fever, chest pain , orthopnea or edema   She says she had  Chest xray earlier this year, told ok.        Allergies  Allergen Reactions  . Sulfa Antibiotics Rash    Immunization History  Administered Date(s) Administered  . Influenza Split 07/16/2015  . Influenza, High Dose Seasonal PF 07/24/2014, 07/21/2016    Past Medical History:  Diagnosis Date  . Chronic atrial fibrillation (HCC)    a. Pt declined cardioversion.  . Coronary artery disease    a. s/p CABGx2 06/2006 (LIMA-LAD, SVG-LCx at bifurcation of OM II/III). b. Nuc 11/2012: no evidence of ischemia, attenuation artifact c/w breast attenuation in anterior region, essentially low risk, EF 88%.  . Essential hypertension   . Hyperlipidemia   . Hypothyroidism   . Insomnia    on ambien, failed other meds  . Iron deficiency anemia   . Osteoarthritis   . Osteopenia    involved on BMD 06/01/07 (T score-1.6); slightly worse in 3/13 (T score -1.6; FRAX 3%  and 12%)-plan rechcekin 2016  . Uterine prolapse    post hysterectomy, Dr. Vincente PoliGrewal  . UTI (lower urinary tract infection) 3/11    Tobacco History: Social History   Tobacco Use  Smoking Status Never Smoker  Smokeless Tobacco Never Used   Counseling given: Not Answered   Outpatient Encounter Medications as  of 10/17/2017  Medication Sig  . acetaminophen (TYLENOL) 500 MG tablet Take 500-1,000 mg by mouth every 6 (six) hours as needed for mild pain.  [name]. apixaban (ELIQUIS) 2.5 MG TABS tablet Take 1 tablet (2.5 mg total) by mouth 2 (two) times daily.  . Ascorbic Acid (VITAMIN C) 1000 MG tablet Take 1,000 mg by mouth daily.  [name]. atorvastatin (LIPITOR) 20 MG tablet Take 1 tablet (20 mg total) by mouth daily. (Patient taking differently: Take 20 mg by mouth at bedtime. )  . Bioflavonoid Products (ESTER-C) TABS Take 1 tablet by mouth daily.  [name]. BIOTIN PO Take 1 tablet by mouth daily.  . Calcium Carbonate-Vitamin D (CALCIUM-D PO) Take 1 tablet by mouth 2 (two) times daily.   . cholecalciferol (VITAMIN D) 1000 units tablet Take 1,000 Units by mouth daily.  . ferrous sulfate 325 (65 FE) MG tablet Take 325 mg by mouth daily with breakfast.  . furosemide (LASIX) 20 MG tablet TAKE 1 TABLET EACH DAY.  [name]. Hypromellose (ARTIFICIAL TEARS OP) Place 1 drop into both eyes every 6 (six) hours as needed (dry eyes).  [name]. levothyroxine (SYNTHROID, LEVOTHROID) 75 MCG tablet Take 37.5-75 mcg by mouth daily before breakfast. 1 tablet 6 days a week and 1/2 tab every Friday.  . metoprolol succinate (TOPROL-XL) 50 MG 24 hr tablet 1 tablet by mouth  daily (Patient taking differently: Take 50 mg by mouth daily. )  . Misc Natural Products (OSTEO BI-FLEX ADV JOINT SHIELD PO) Take 1 tablet by mouth 2 (two) times daily.   . nitroGLYCERIN (NITROSTAT) 0.4 MG SL tablet Place 0.4 mg under the tongue every 5 (five) minutes as needed for chest pain (X3 DOSES BEFORE CALLING 911).  . Omega-3 Fatty Acids (FISH OIL) 1200 MG CAPS Take 1,200 mg by mouth 2 (two) times daily.  . ranitidine (ZANTAC) 150 MG tablet Take 150 mg by mouth at bedtime.  [name]. SPIRIVA RESPIMAT 2.5 MCG/ACT AERS USE 2 PUFFS ONCE DAILY.  . vitamin B-12 (CYANOCOBALAMIN) 1000 MCG tablet Take 1,000 mcg by mouth daily.  [name]. zolpidem (AMBIEN) 5 MG tablet Take 5 mg by mouth at bedtime as needed  for sleep.   No facility-administered encounter medications on file as of 10/17/2017.      Review of Systems  Constitutional:   No  weight loss, night sweats,  Fevers, chills, fatigue, or  lassitude.  HEENT:   No headaches,  Difficulty swallowing,  Tooth/dental problems, or  Sore throat,                No sneezing, itching, ear ache,  +nasal congestion, post nasal drip,   CV:  No chest pain,  Orthopnea, PND, swelling in lower extremities, anasarca, dizziness, palpitations, syncope.   GI  No heartburn, indigestion, abdominal pain, nausea, vomiting, diarrhea, change in bowel habits, loss of appetite, bloody stools.   Resp:   No excess mucus, no productive cough,  No non-productive cough,  No coughing up of blood.  No change in color of mucus.  No wheezing.  No chest wall deformity  Skin: no rash or lesions.  GU: no dysuria, change in color of urine, no urgency or frequency.  No flank pain, no hematuria   MS:  No joint pain or swelling.  No decreased range of motion.  No back pain.    Physical Exam  BP 132/72 (BP Location: Left Arm, Cuff Size: Normal)   Pulse 62   Ht 5\' 2"  (1.575 m)   Wt 118 lb 6.4 oz (53.7 kg)   SpO2 97%   BMI 21.66 kg/m   GEN: A/Ox3; pleasant , NAD, elderly female    HEENT:  [place]/AT,  EACs-clear, TMs-wnl, NOSE-clear, THROAT-clear, no lesions, no postnasal drip or exudate noted.   NECK:  Supple w/ fair ROM; no JVD; normal carotid impulses w/o bruits; no thyromegaly or nodules palpated; no lymphadenopathy.    RESP  Clear  P & A; w/o, wheezes/ rales/ or rhonchi. no accessory muscle use, no dullness to percussion  CARD:  RRR, no m/r/g, no peripheral edema, pulses intact, no cyanosis or clubbing.  GI:   Soft & nt; nml bowel sounds; no organomegaly or masses detected.   Musco: Warm bil, no deformities or joint swelling noted.   Neuro: alert, no focal deficits noted.    Skin: Warm, no lesions or rashes    Lab Results:  CBC   BNP   Imaging: No  results found.   Assessment & Plan:   COPD (chronic obstructive pulmonary disease) (HCC) Well controlled   Plan  Patient Instructions  Continue on Spiriva 2 puffs daily .  Follow up with Dr. Isaiah SergeMannam in 1 year and As needed        URI (upper respiratory infection) Appears to be resolving  claritin As needed        Rubye Oaksammy Alizea Pell, NP 10/17/2017

## 2017-10-17 NOTE — Assessment & Plan Note (Signed)
Appears to be resolving  claritin As needed

## 2017-12-16 ENCOUNTER — Other Ambulatory Visit: Payer: Self-pay | Admitting: Interventional Cardiology

## 2017-12-16 NOTE — Telephone Encounter (Signed)
Pt last saw Dr Eldridge DaceVaranasi on 03/18/17, last labs in chart from 08/20/16, called pt's primary MD requested labwork, they are faxing over labwork from 10/2017.  On 10/06/17 Creat 0.89, age 84, weight 53.7kg, based on specified criteria pt is on appropriate dosage of Eliquis 2.5mg  BID.  Will refill rx.

## 2018-01-11 ENCOUNTER — Other Ambulatory Visit: Payer: Self-pay | Admitting: Interventional Cardiology

## 2018-04-03 NOTE — Progress Notes (Signed)
Cardiology Office Note   Date:  04/04/2018   ID:  [name], DOB January 28, 1932, MRN [id number]  PCP:  Lorenda Ishihara, MD    No chief complaint on file.  CAD  Wt Readings from Last 3 Encounters:  04/04/18 114 lb (51.7 kg)  10/17/17 118 lb 6.4 oz (53.7 kg)  03/18/17 117 lb (53.1 kg)       History of Present Illness: [name] is a 84 y.o. female  who had CABG in 2007 for left main disease, by Dr. Dorris Fetch. She had hysterectomy in May 2012 and did well.    Her husband passed away a few years ago.  She has had some hip pain which has limited her walking and dancing. She has a fair amount of pain.  Very little relief from the shot she had in her hip.   In Jan 2018, she had a colonoscopy that was ok.    She has bowled in the past and had some DOE with square dancing.  Sx were mild and were managed conservatively.   She has occasional leg pain.  She had a severe left leg pain that lasted one night after square dancing, but resolved.  Denies : recent Chest pain. Dizziness. Leg edema. SL NTG use Orthopnea. Palpitations. Paroxysmal nocturnal dyspnea. Shortness of breath. Syncope.     Past Medical History:  Diagnosis Date  . Chronic atrial fibrillation (HCC)    a. Pt declined cardioversion.  . Coronary artery disease    a. s/p CABGx2 06/2006 (LIMA-LAD, SVG-LCx at bifurcation of OM II/III). b. Nuc 11/2012: no evidence of ischemia, attenuation artifact c/w breast attenuation in anterior region, essentially low risk, EF 88%.  . Essential hypertension   . Hyperlipidemia   . Hypothyroidism   . Insomnia    on ambien, failed other meds  . Iron deficiency anemia   . Osteoarthritis   . Osteopenia    involved on BMD 06/01/07 (T score-1.6); slightly worse in 3/13 (T score -1.6; FRAX 3%  and 12%)-plan rechcekin 2016  . Uterine prolapse    post hysterectomy, Dr. Vincente Poli  . UTI (lower urinary tract infection) 3/11    Past Surgical History:  Procedure Laterality Date   . APPENDECTOMY    . CATARACT EXTRACTION, BILATERAL  2009  . COLONOSCOPY WITH PROPOFOL N/A 11/30/2016   Procedure: COLONOSCOPY WITH PROPOFOL;  Surgeon: Charolett Bumpers, MD;  Location: [facility];  Service: Endoscopy;  Laterality: N/A;  . CORONARY ARTERY BYPASS GRAFT  2007  . ESOPHAGOGASTRODUODENOSCOPY N/A 11/30/2016   Procedure: ESOPHAGOGASTRODUODENOSCOPY (EGD);  Surgeon: Charolett Bumpers, MD;  Location: [facility];  Service: Endoscopy;  Laterality: N/A;  . LIPOMA EXCISION    . OVARIAN CYST REMOVAL    . ROTATOR CUFF REPAIR    . TEAR DUCT PROBING     X2 on left and x1 on right     Current Outpatient Medications  Medication Sig Dispense Refill  . acetaminophen (TYLENOL) 500 MG tablet Take 500-1,000 mg by mouth every 6 (six) hours as needed for mild pain.    [name] atorvastatin (LIPITOR) 20 MG tablet Take 1 tablet (20 mg total) by mouth daily. (Patient taking differently: Take 20 mg by mouth at bedtime. ) 90 tablet 3  . Bioflavonoid Products (ESTER-C) TABS Take 1 tablet by mouth daily.    [name] BIOTIN PO Take 1 tablet by mouth daily.    . Calcium Carbonate-Vitamin D (CALCIUM-D PO) Take 1 tablet by mouth 2 (two) times daily.     [name]  cholecalciferol (VITAMIN D) 1000 units tablet Take 1,000 Units by mouth daily.    [name]. ELIQUIS 2.5 MG TABS tablet TAKE 1 TABLET BY MOUTH TWICE DAILY. 180 tablet 1  . ferrous sulfate 325 (65 FE) MG tablet Take 325 mg by mouth 3 (three) times a week.     . furosemide (LASIX) 20 MG tablet TAKE 1 TABLET EACH DAY. 90 tablet 2  . Hypromellose (ARTIFICIAL TEARS OP) Place 1 drop into both eyes every 6 (six) hours as needed (dry eyes).    [name]. levothyroxine (SYNTHROID, LEVOTHROID) 75 MCG tablet Take 37.5-75 mcg by mouth daily before breakfast. 1 tablet 6 days a week and 1/2 tab every Friday.    . metoprolol succinate (TOPROL-XL) 50 MG 24 hr tablet 1 tablet by mouth daily (Patient taking differently: Take 50 mg by mouth daily. ) 90 tablet 3  . Misc Natural Products (OSTEO BI-FLEX ADV  JOINT SHIELD PO) Take 1 tablet by mouth 2 (two) times daily.     . nitroGLYCERIN (NITROSTAT) 0.4 MG SL tablet Place 1 tablet (0.4 mg total) under the tongue every 5 (five) minutes as needed for chest pain. 25 tablet 0  . Omega-3 Fatty Acids (FISH OIL) 1200 MG CAPS Take 1,200 mg by mouth 2 (two) times daily.    . ranitidine (ZANTAC) 150 MG tablet Take 150 mg by mouth at bedtime.    . Tiotropium Bromide Monohydrate (SPIRIVA RESPIMAT) 2.5 MCG/ACT AERS Inhale 2 puffs into the lungs daily. 2 Inhaler 0  . zolpidem (AMBIEN) 5 MG tablet Take 5 mg by mouth at bedtime as needed for sleep.     No current facility-administered medications for this visit.     Allergies:   Sulfa antibiotics    Social History:  The patient  reports that she has never smoked. She has never used smokeless tobacco. She reports that she does not drink alcohol or use drugs.   Family History:  The patient's family history includes CAD in her father; Cancer in her brother; Diabetes in her brother and father; Heart attack in her father; Heart disease in her mother; Lymphoma in her sister; Syncope episode in her mother; Ulcers in her mother.    ROS:  Please see the history of present illness.   Otherwise, review of systems are positive for .   All other systems are reviewed and negative.    PHYSICAL EXAM: VS:  BP 140/72   Pulse 62   Ht 5\' 2"  (1.575 m)   Wt 114 lb (51.7 kg)   SpO2 98%   BMI 20.85 kg/m  , BMI Body mass index is 20.85 kg/m. GEN: Well nourished, well developed, in no acute distress  HEENT: normal  Neck: no JVD, carotid bruits, or masses Cardiac: irregularly irregular; no murmurs, rubs, or gallops,no edema  Respiratory:  clear to auscultation bilaterally, normal work of breathing GI: soft, nontender, nondistended, + BS MS: no deformity or atrophy  Skin: warm and dry, no rash Neuro:  Strength and sensation are intact Psych: euthymic mood, full affect   EKG:   The ekg ordered today demonstrates AFib,  rate controlled   Recent Labs: No results found for requested labs within last 8760 hours.   Lipid Panel No results found for: CHOL, TRIG, HDL, CHOLHDL, VLDL, LDLCALC, LDLDIRECT   Other studies Reviewed: Additional studies/ records that were reviewed today with results demonstrating: .   ASSESSMENT AND PLAN:  1. CAD: No significant angina on medical therapy.  Continue current medications and secondary prevention.  2. AFib: Rate controlled.  Continue Eliquis.  Check labs since it has been 6 months for her hemoglobin and creatinine. 3. DOE: Controlled.  She remains active.  Continue exercise target as noted below. 4. Hyperlipidemia: LDL 41.  Continue atorvastatin.   Current medicines are reviewed at length with the patient today.  The patient concerns regarding her medicines were addressed.  The following changes have been made:  No change  Labs/ tests ordered today include:  No orders of the defined types were placed in this encounter.   Recommend 150 minutes/week of aerobic exercise Low fat, low carb, high fiber diet recommended  Disposition:   FU in 1 year   Signed, Lance Muss, MD  04/04/2018 10:21 AM    [facility] [address] [place], [place], [place]  [address] Phone: [phone]; Fax: [phone]

## 2018-04-04 ENCOUNTER — Ambulatory Visit: Payer: Medicare Other | Admitting: Interventional Cardiology

## 2018-04-04 ENCOUNTER — Encounter (INDEPENDENT_AMBULATORY_CARE_PROVIDER_SITE_OTHER): Payer: Self-pay

## 2018-04-04 ENCOUNTER — Encounter: Payer: Self-pay | Admitting: Interventional Cardiology

## 2018-04-04 VITALS — BP 140/72 | HR 62 | Ht 62.0 in | Wt 114.0 lb

## 2018-04-04 DIAGNOSIS — R0609 Other forms of dyspnea: Secondary | ICD-10-CM

## 2018-04-04 DIAGNOSIS — I25119 Atherosclerotic heart disease of native coronary artery with unspecified angina pectoris: Secondary | ICD-10-CM | POA: Diagnosis not present

## 2018-04-04 DIAGNOSIS — I482 Chronic atrial fibrillation, unspecified: Secondary | ICD-10-CM

## 2018-04-04 DIAGNOSIS — E782 Mixed hyperlipidemia: Secondary | ICD-10-CM

## 2018-04-04 LAB — BASIC METABOLIC PANEL
BUN/Creatinine Ratio: 16 (ref 12–28)
BUN: 13 mg/dL (ref 8–27)
CALCIUM: 9.6 mg/dL (ref 8.7–10.3)
CHLORIDE: 102 mmol/L (ref 96–106)
CO2: 23 mmol/L (ref 20–29)
CREATININE: 0.83 mg/dL (ref 0.57–1.00)
GFR calc Af Amer: 75 mL/min/{1.73_m2} (ref >59)
GFR calc non Af Amer: 65 mL/min/{1.73_m2} (ref >59)
GLUCOSE: 86 mg/dL (ref 65–99)
Potassium: 4.1 mmol/L (ref 3.5–5.2)
Sodium: 140 mmol/L (ref 134–144)

## 2018-04-04 LAB — CBC
HEMOGLOBIN: 11.8 g/dL (ref 11.1–15.9)
Hematocrit: 35.6 % (ref 34.0–46.6)
MCH: 28.9 pg (ref 26.6–33.0)
MCHC: 33.1 g/dL (ref 31.5–35.7)
MCV: 87 fL (ref 79–97)
PLATELETS: 143 10*3/uL — AB (ref 150–450)
RBC: 4.08 x10E6/uL (ref 3.77–5.28)
RDW: 14.1 % (ref 12.3–15.4)
WBC: 4.7 10*3/uL (ref 3.4–10.8)

## 2018-04-04 NOTE — Patient Instructions (Addendum)
Medication Instructions:  Your physician recommends that you continue on your current medications as directed. Please refer to the Current Medication list given to you today.   Labwork: TODAY: CBC, BMET  Testing/Procedures: None ordered  Follow-Up: Your physician wants you to follow-up in: 1 year with Dr. Varanasi. You will receive a reminder letter in the mail two months in advance. If you don't receive a letter, please call our office to schedule the follow-up appointment.   Any Other Special Instructions Will Be Listed Below (If Applicable).     If you need a refill on your cardiac medications before your next appointment, please call your pharmacy.   

## 2018-06-07 ENCOUNTER — Other Ambulatory Visit: Payer: Self-pay | Admitting: Interventional Cardiology

## 2018-10-17 ENCOUNTER — Ambulatory Visit: Payer: Medicare Other | Admitting: Pulmonary Disease

## 2018-11-22 ENCOUNTER — Ambulatory Visit: Payer: Medicare Other | Admitting: Pulmonary Disease

## 2018-11-22 ENCOUNTER — Encounter: Payer: Self-pay | Admitting: Pulmonary Disease

## 2018-11-22 VITALS — BP 128/80 | HR 61 | Ht 62.0 in | Wt 116.4 lb

## 2018-11-22 DIAGNOSIS — J449 Chronic obstructive pulmonary disease, unspecified: Secondary | ICD-10-CM

## 2018-11-22 LAB — CBC WITH DIFFERENTIAL/PLATELET
Basophils Absolute: 0 10*3/uL (ref 0.0–0.1)
Basophils Relative: 1 % (ref 0.0–3.0)
EOS PCT: 3.9 % (ref 0.0–5.0)
Eosinophils Absolute: 0.2 10*3/uL (ref 0.0–0.7)
HCT: 35.8 % — ABNORMAL LOW (ref 36.0–46.0)
Hemoglobin: 11.8 g/dL — ABNORMAL LOW (ref 12.0–15.0)
LYMPHS ABS: 1.4 10*3/uL (ref 0.7–4.0)
Lymphocytes Relative: 30.3 % (ref 12.0–46.0)
MCHC: 32.8 g/dL (ref 30.0–36.0)
MCV: 89.8 fl (ref 78.0–100.0)
MONOS PCT: 13 % — AB (ref 3.0–12.0)
Monocytes Absolute: 0.6 10*3/uL (ref 0.1–1.0)
NEUTROS ABS: 2.3 10*3/uL (ref 1.4–7.7)
NEUTROS PCT: 51.8 % (ref 43.0–77.0)
Platelets: 132 10*3/uL — ABNORMAL LOW (ref 150.0–400.0)
RBC: 3.99 Mil/uL (ref 3.87–5.11)
RDW: 14.2 % (ref 11.5–15.5)
WBC: 4.5 10*3/uL (ref 4.0–10.5)

## 2018-11-22 MED ORDER — TIOTROPIUM BROMIDE MONOHYDRATE 2.5 MCG/ACT IN AERS: 2.0000 | INHALATION_SPRAY | Freq: Every day | RESPIRATORY_TRACT | 11 refills | Status: DC

## 2018-11-22 NOTE — Progress Notes (Signed)
[name]    [id number]    [birth year]  Primary Care Physician:Varadarajan, Soyla Murphy, MD  Referring Physician: Lorenda Ishihara, MD [address] [place], [place] [address]  Chief complaint: Follow-up for COPD  HPI: 85 year old with COPD.  Previously followed by Dr. Clayborn Bigness He is on Spiriva.  She states that she is doing well with this regimen.  Complains of mild dyspnea on exertion.  No cough, sputum production, fevers, chills.  Pets: Has a dog and a cat.  No birds, farm animals Occupation: Retired Chartered loss adjuster. Exposures: No known exposures, no mold, hot tub, Jacuzzi Smoking history: Never smoker Travel history: No significant travel history Relevant family history: No significant family history of lung disease  Outpatient Encounter Medications as of 11/22/2018  Medication Sig  . acetaminophen (TYLENOL) 500 MG tablet Take 500-1,000 mg by mouth every 6 (six) hours as needed for mild pain.  [name] atorvastatin (LIPITOR) 20 MG tablet Take 1 tablet (20 mg total) by mouth daily. (Patient taking differently: Take 20 mg by mouth at bedtime. )  . Bioflavonoid Products (ESTER-C) TABS Take 1 tablet by mouth daily.  [name] BIOTIN PO Take 1 tablet by mouth daily.  . Calcium Carbonate-Vitamin D (CALCIUM-D PO) Take 1 tablet by mouth 2 (two) times daily.   . cholecalciferol (VITAMIN D) 1000 units tablet Take 1,000 Units by mouth daily.  [name] ELIQUIS 2.5 MG TABS tablet TAKE 1 TABLET BY MOUTH TWICE DAILY.  . ferrous sulfate 325 (65 FE) MG tablet Take 325 mg by mouth 3 (three) times a week.   . furosemide (LASIX) 20 MG tablet TAKE 1 TABLET EACH DAY.  [name] Hypromellose (ARTIFICIAL TEARS OP) Place 1 drop into both eyes every 6 (six) hours as needed (dry eyes).  [name] levothyroxine (SYNTHROID, LEVOTHROID) 75 MCG tablet Take 37.5-75 mcg by mouth daily before breakfast. 1 tablet 6 days a week and 1/2 tab every Friday.  . metoprolol succinate (TOPROL-XL) 50 MG 24 hr tablet 1 tablet by mouth  daily (Patient taking differently: Take 50 mg by mouth daily. )  . Misc Natural Products (OSTEO BI-FLEX ADV JOINT SHIELD PO) Take 1 tablet by mouth 2 (two) times daily.   . nitroGLYCERIN (NITROSTAT) 0.4 MG SL tablet Place 1 tablet (0.4 mg total) under the tongue every 5 (five) minutes as needed for chest pain.  . Omega-3 Fatty Acids (FISH OIL) 1200 MG CAPS Take 1,200 mg by mouth 2 (two) times daily.  . ranitidine (ZANTAC) 150 MG tablet Take 150 mg by mouth at bedtime.  . Tiotropium Bromide Monohydrate (SPIRIVA RESPIMAT) 2.5 MCG/ACT AERS Inhale 2 puffs into the lungs daily.  [name] zolpidem (AMBIEN) 5 MG tablet Take 5 mg by mouth at bedtime as needed for sleep.   No facility-administered encounter medications on file as of 11/22/2018.     Allergies as of 11/22/2018 - Review Complete 11/22/2018  Allergen Reaction Noted  . Sulfa antibiotics Rash 10/29/2013    Past Medical History:  Diagnosis Date  . Chronic atrial fibrillation    a. Pt declined cardioversion.  . Coronary artery disease    a. s/p CABGx2 06/2006 (LIMA-LAD, SVG-LCx at bifurcation of OM II/III). b. Nuc 11/2012: no evidence of ischemia, attenuation artifact c/w breast attenuation in anterior region, essentially low risk, EF 88%.  . Essential hypertension   . Hyperlipidemia   . Hypothyroidism   . Insomnia    on ambien, failed other meds  . Iron deficiency anemia   .  Osteoarthritis   . Osteopenia    involved on BMD 06/01/07 (T score-1.6); slightly worse in 3/13 (T score -1.6; FRAX 3%  and 12%)-plan rechcekin 2016  . Uterine prolapse    post hysterectomy, Dr. Vincente PoliGrewal  . UTI (lower urinary tract infection) 3/11    Past Surgical History:  Procedure Laterality Date  . APPENDECTOMY    . CATARACT EXTRACTION, BILATERAL  2009  . COLONOSCOPY WITH PROPOFOL N/A 11/30/2016   Procedure: COLONOSCOPY WITH PROPOFOL;  Surgeon: Charolett BumpersMartin K Johnson, MD;  Location: [facility];  Service: Endoscopy;  Laterality: N/A;  . CORONARY ARTERY BYPASS GRAFT   2007  . ESOPHAGOGASTRODUODENOSCOPY N/A 11/30/2016   Procedure: ESOPHAGOGASTRODUODENOSCOPY (EGD);  Surgeon: Charolett BumpersMartin K Johnson, MD;  [facility];  Service: Endoscopy;  Laterality: N/A;  . LIPOMA EXCISION    . OVARIAN CYST REMOVAL    . ROTATOR CUFF REPAIR    . TEAR DUCT PROBING     X2 on left and x1 on right    Family History  Problem Relation Age of Onset  . Heart disease Mother   . Syncope episode Mother   . Ulcers Mother   . Heart attack Father   . CAD Father   . Diabetes Father   . Lymphoma Sister   . Cancer Brother        throat  . Diabetes Brother     Social History   Socioeconomic History  . Marital status: Married    Spouse name: Not on file  . Number of children: Not on file  . Years of education: Not on file  . Highest education level: Not on file  Occupational History  . Not on file  Social Needs  . Financial resource strain: Not on file  . Food insecurity:    Worry: Not on file    Inability: Not on file  . Transportation needs:    Medical: Not on file    Non-medical: Not on file  Tobacco Use  . Smoking status: Never Smoker  . Smokeless tobacco: Never Used  Substance and Sexual Activity  . Alcohol use: No    Alcohol/week: 0.0 standard drinks  . Drug use: No  . Sexual activity: Not on file  Lifestyle  . Physical activity:    Days per week: Not on file    Minutes per session: Not on file  . Stress: Not on file  Relationships  . Social connections:    Talks on phone: Not on file    Gets together: Not on file    Attends religious service: Not on file    Active member of club or organization: Not on file    Attends meetings of clubs or organizations: Not on file    Relationship status: Not on file  . Intimate partner violence:    Fear of current or ex partner: Not on file    Emotionally abused: Not on file    Physically abused: Not on file    Forced sexual activity: Not on file  Other Topics Concern  . Not on file  Social History  Narrative  . Not on file    Review of systems: Review of Systems  Constitutional: Negative for fever and chills.  HENT: Negative.   Eyes: Negative for blurred vision.  Respiratory: as per HPI  Cardiovascular: Negative for chest pain and palpitations.  Gastrointestinal: Negative for vomiting, diarrhea, blood per rectum. Genitourinary: Negative for dysuria, urgency, frequency and hematuria.  Musculoskeletal: Negative for myalgias, back pain and joint  pain.  Skin: Negative for itching and rash.  Neurological: Negative for dizziness, tremors, focal weakness, seizures and loss of consciousness.  Endo/Heme/Allergies: Negative for environmental allergies.  Psychiatric/Behavioral: Negative for depression, suicidal ideas and hallucinations.  All other systems reviewed and are negative.  Physical Exam: Blood pressure 128/80, pulse 61, height 5\' 2"  (1.575 m), weight 116 lb 6.4 oz (52.8 kg), SpO2 97 %. Gen:      No acute distress HEENT:  EOMI, sclera anicteric Neck:     No masses; no thyromegaly Lungs:    Clear to auscultation bilaterally; normal respiratory effort CV:         Regular rate and rhythm; no murmurs Abd:      + bowel sounds; soft, non-tender; no palpable masses, no distension Ext:    No edema; adequate peripheral perfusion Skin:      Warm and dry; no rash Neuro: alert and oriented x 3 Psych: normal mood and affect  Data Reviewed: Imaging: CT chest 10/31/2015 mild bibasilar atelectasis, scarring. Chest x-ray 07/21/2018- probable scarring or pleural thickening in the right lung base. I have reviewed the images personally.  PFTs: 12/17/2015 FVC 2.48 [120%], FEV1 1.09 [72%], F/F 44, TLC 103%, DLCO 68% Moderate obstruction.  Disproportionately blunted expiratory flow loop Minimal diffusion defect  Labs: CBC 08/20/2016-WBC 5.4, eos 3%, absolute eosinophil count 162  Cardiac: Echocardiogram 11/25/2015- LVEF 50-55%, PA peak pressure 45 mm. Nuclear stress test 11/25/2015- normal  study with no ischemia.  Assessment:  Moderate COPD She is a never smoker.  Has a diagnosis of COPD on record Not sure if the obstruction on the PFTs is accurate as there is a abnormal, disproportionately blunted expiratory flow loop suggestive of poor effort.  I have suggested to her that we can repeat the PFTs or spirometry but she does not want to go to the procedure again We will continue Spiriva for now as she feels it is helping with her breathing Check alpha-1 antitrypsin levels, CBC differential and IgE for baseline assessment  Plan/Recommendations: - Continue Spiriva - Check CBC, IgE, alpha-1 antitrypsin  Chilton Greathouse MD [facility] 11/22/2018, 3:11 PM  CC: Lorenda Ishihara,*

## 2018-11-22 NOTE — Patient Instructions (Signed)
Glad you are doing well with your breathing We will continue the Spiriva.  We will call in refills We will check some blood test today including CBC differential, IgE, alpha-1 antitrypsin levels and phenotype  Follow-up in 1 year

## 2018-11-29 LAB — ALPHA-1 ANTITRYPSIN PHENOTYPE: A1 ANTITRYPSIN SER: 144 mg/dL (ref 83–199)

## 2018-11-29 LAB — IGE: IgE (Immunoglobulin E), Serum: 7 kU/L (ref <=114)

## 2019-03-02 ENCOUNTER — Other Ambulatory Visit: Payer: Self-pay | Admitting: Interventional Cardiology

## 2019-03-02 NOTE — Telephone Encounter (Signed)
86yo 52.5kg OV 04/2018 with DR Eldridge Dace Scr = 0.83 on 04/04/2018

## 2019-04-02 ENCOUNTER — Telehealth: Payer: Self-pay

## 2019-04-02 NOTE — Telephone Encounter (Signed)
Virtual Visit Pre-Appointment Phone Call  "(Name), I am calling you today to discuss your upcoming appointment. We are currently trying to limit exposure to the virus that causes COVID-19 by seeing patients at home rather than in the office."  1. "What is the BEST phone number to call the day of the visit?" - include this in appointment notes  2. "Do you have or have access to (through a family member/friend) a smartphone with video capability that we can use for your visit?" a. If yes - list this number in appt notes as "cell" (if different from BEST phone #) and list the appointment type as a VIDEO visit in appointment notes b. If no - list the appointment type as a PHONE visit in appointment notes  3. Confirm consent - "In the setting of the current Covid19 crisis, you are scheduled for a (phone or video) visit with your provider on (date) at (time).  Just as we do with many in-office visits, in order for you to participate in this visit, we must obtain consent.  If you'd like, I can send this to your mychart (if signed up) or email for you to review.  Otherwise, I can obtain your verbal consent now.  All virtual visits are billed to your insurance company just like a normal visit would be.  By agreeing to a virtual visit, we'd like you to understand that the technology does not allow for your provider to perform an examination, and thus may limit your provider's ability to fully assess your condition. If your provider identifies any concerns that need to be evaluated in person, we will make arrangements to do so.  Finally, though the technology is pretty good, we cannot assure that it will always work on either your or our end, and in the setting of a video visit, we may have to convert it to a phone-only visit.  In either situation, we cannot ensure that we have a secure connection.  Are you willing to proceed?" STAFF: Did the patient verbally acknowledge consent to telehealth visit? Document  YES/NO here: yes  4. Advise patient to be prepared - "Two hours prior to your appointment, go ahead and check your blood pressure, pulse, oxygen saturation, and your weight (if you have the equipment to check those) and write them all down. When your visit starts, your provider will ask you for this information. If you have an Apple Watch or Kardia device, please plan to have heart rate information ready on the day of your appointment. Please have a pen and paper handy nearby the day of the visit as well."  5. Give patient instructions for MyChart download to smartphone OR Doximity/Doxy.me as below if video visit (depending on what platform provider is using)  6. Inform patient they will receive a phone call 15 minutes prior to their appointment time (may be from unknown caller ID) so they should be prepared to answer    TELEPHONE CALL NOTE  Marisa SheerLoberta M Tadlock has been deemed a candidate for a follow-up tele-health visit to limit community exposure during the Covid-19 pandemic. I spoke with the patient via phone to ensure availability of phone/video source, confirm preferred email & phone number, and discuss instructions and expectations.  I reminded [name] to be prepared with any vital sign and/or heart rhythm information that could potentially be obtained via home monitoring, at the time of her visit. I reminded Marisa SheerLoberta M Carew to expect a phone call prior to  her visit.  Clide Dales Henri Guedes, CMA 04/02/2019 10:50 AM   INSTRUCTIONS FOR DOWNLOADING THE MYCHART APP TO SMARTPHONE  - The patient must first make sure to have activated MyChart and know their login information - If Apple, go to [employer] and type in MyChart in the search bar and download the app. If Android, ask patient to go to [employer] and type in [place] in the search bar and download the app. The app is free but as with any other app downloads, their phone may require them to verify saved payment information or  Apple/Android password.  - The patient will need to then log into the app with their MyChart username and password, and select [facility] as their healthcare provider to link the account. When it is time for your visit, go to the MyChart app, find appointments, and click Begin Video Visit. Be sure to Select Allow for your device to access the Microphone and Camera for your visit. You will then be connected, and your provider will be with you shortly.  **If they have any issues connecting, or need assistance please contact MyChart service desk [phone])**  **If using a computer, in order to ensure the best quality for their visit they will need to use either of the following Internet Browsers: [employer] or Google Chrome**  IF USING DOXIMITY or DOXY.ME - The patient will receive a link just prior to their visit by text.     FULL LENGTH CONSENT FOR TELE-HEALTH VISIT   I hereby voluntarily request, consent and authorize [facility] and its employed or contracted physicians, physician assistants, nurse practitioners or other licensed health care professionals (the Practitioner), to provide me with telemedicine health care services (the "Services") as deemed necessary by the treating Practitioner. I acknowledge and consent to receive the Services by the Practitioner via telemedicine. I understand that the telemedicine visit will involve communicating with the Practitioner through live audiovisual communication technology and the disclosure of certain medical information by electronic transmission. I acknowledge that I have been given the opportunity to request an in-person assessment or other available alternative prior to the telemedicine visit and am voluntarily participating in the telemedicine visit.  I understand that I have the right to withhold or withdraw my consent to the use of telemedicine in the course of my care at any time, without affecting my right to future care  or treatment, and that the Practitioner or I may terminate the telemedicine visit at any time. I understand that I have the right to inspect all information obtained and/or recorded in the course of the telemedicine visit and may receive copies of available information for a reasonable fee.  I understand that some of the potential risks of receiving the Services via telemedicine include:  [name] Delay or interruption in medical evaluation due to technological equipment failure or disruption; . Information transmitted may not be sufficient (e.g. poor resolution of images) to allow for appropriate medical decision making by the Practitioner; and/or  . In rare instances, security protocols could fail, causing a breach of personal health information.  Furthermore, I acknowledge that it is my responsibility to provide information about my medical history, conditions and care that is complete and accurate to the best of my ability. I acknowledge that Practitioner's advice, recommendations, and/or decision may be based on factors not within their control, such as incomplete or inaccurate data provided by me or distortions of diagnostic images or specimens that may result from electronic transmissions.  I understand that the practice of medicine is not an exact science and that Practitioner makes no warranties or guarantees regarding treatment outcomes. I acknowledge that I will receive a copy of this consent concurrently upon execution via email to the email address I last provided but may also request a printed copy by calling the office of [facility].    I understand that my insurance will be billed for this visit.   I have read or had this consent read to me. . I understand the contents of this consent, which adequately explains the benefits and risks of the Services being provided via telemedicine.  . I have been provided ample opportunity to ask questions regarding this consent and the Services and have had  my questions answered to my satisfaction. . I give my informed consent for the services to be provided through the use of telemedicine in my medical care  By participating in this telemedicine visit I agree to the above.

## 2019-04-04 NOTE — Progress Notes (Signed)
Virtual Visit via Video Note   This visit type was conducted due to national recommendations for restrictions regarding the COVID-19 Pandemic (e.g. social distancing) in an effort to limit this patient's exposure and mitigate transmission in our community.  Due to her co-morbid illnesses, this patient is at least at moderate risk for complications without adequate follow up.  This format is felt to be most appropriate for this patient at this time.  All issues noted in this document were discussed and addressed.  A limited physical exam was performed with this format.  Please refer to the patient's chart for her consent to telehealth for [facility].   Patient unable to access camera  Date:  04/05/2019   ID:  [name], DOB 1932/03/28, MRN [id number]  Patient Location: Home Provider Location: Home  PCP:  Lorenda Ishihara, MD  Cardiologist:  No primary care provider on file. [place] Electrophysiologist:  None   Evaluation Performed:  Follow-Up Visit  Chief Complaint:  CAD  History of Present Illness:    NGA SERGI is a 85 y.o. female who had CABG in 2007 for left main disease, by Dr. Dorris Fetch. She had hysterectomy in May 2012 and did well.  Her husband passed away a few years ago.  She has had some hip pain which has limited her walking and dancing.She has a fair amount of pain. Very little relief from the shot she had in her hip.   In Jan 2018, she had a colonoscopy that was ok.  She has bowled in the past and had some DOE with square dancing.  Sx were mild and were managed conservatively.   The patient does not have symptoms concerning for COVID-19 infection (fever, chills, cough, or new shortness of breath).   Denies : Chest pain. Dizziness. Leg edema. Nitroglycerin use. Orthopnea. Palpitations. Paroxysmal nocturnal dyspnea. Shortness of breath. Syncope.    Past Medical History:  Diagnosis Date  . Chronic atrial fibrillation    a. Pt declined  cardioversion.  . Coronary artery disease    a. s/p CABGx2 06/2006 (LIMA-LAD, SVG-LCx at bifurcation of OM II/III). b. Nuc 11/2012: no evidence of ischemia, attenuation artifact c/w breast attenuation in anterior region, essentially low risk, EF 88%.  . Essential hypertension   . Hyperlipidemia   . Hypothyroidism   . Insomnia    on ambien, failed other meds  . Iron deficiency anemia   . Osteoarthritis   . Osteopenia    involved on BMD 06/01/07 (T score-1.6); slightly worse in 3/13 (T score -1.6; FRAX 3%  and 12%)-plan rechcekin 2016  . Uterine prolapse    post hysterectomy, Dr. Vincente Poli  . UTI (lower urinary tract infection) 3/11   Past Surgical History:  Procedure Laterality Date  . APPENDECTOMY    . CATARACT EXTRACTION, BILATERAL  2009  . COLONOSCOPY WITH PROPOFOL N/A 11/30/2016   Procedure: COLONOSCOPY WITH PROPOFOL;  Surgeon: Charolett Bumpers, MD;  Location: [facility];  Service: Endoscopy;  Laterality: N/A;  . CORONARY ARTERY BYPASS GRAFT  2007  . ESOPHAGOGASTRODUODENOSCOPY N/A 11/30/2016   Procedure: ESOPHAGOGASTRODUODENOSCOPY (EGD);  Surgeon: Charolett Bumpers, MD;  Location: [facility];  Service: Endoscopy;  Laterality: N/A;  . LIPOMA EXCISION    . OVARIAN CYST REMOVAL    . ROTATOR CUFF REPAIR    . TEAR DUCT PROBING     X2 on left and x1 on right     Current Meds  Medication Sig  . acetaminophen (TYLENOL) 500 MG tablet Take 500-1,000  mg by mouth every 6 (six) hours as needed for mild pain.  [name]. atorvastatin (LIPITOR) 20 MG tablet Take 1 tablet (20 mg total) by mouth daily. (Patient taking differently: Take 20 mg by mouth at bedtime. )  . Bioflavonoid Products (ESTER-C) TABS Take 1 tablet by mouth daily.  [name]. BIOTIN PO Take 1 tablet by mouth daily.  . Calcium Carbonate-Vitamin D (CALCIUM-D PO) Take 1 tablet by mouth 2 (two) times daily.   . cholecalciferol (VITAMIN D) 1000 units tablet Take 1,000 Units by mouth daily.  [name]. ELIQUIS 2.5 MG TABS tablet TAKE 1 TABLET BY MOUTH TWICE  DAILY.  . ferrous sulfate 325 (65 FE) MG tablet Take 325 mg by mouth 3 (three) times a week.   . furosemide (LASIX) 20 MG tablet TAKE 1 TABLET EACH DAY.  [name]. Hypromellose (ARTIFICIAL TEARS OP) Place 1 drop into both eyes every 6 (six) hours as needed (dry eyes).  [name]. levothyroxine (SYNTHROID, LEVOTHROID) 75 MCG tablet Take 37.5-75 mcg by mouth daily before breakfast. 1 tablet 6 days a week and 1/2 tab every Friday.  . metoprolol succinate (TOPROL-XL) 50 MG 24 hr tablet 1 tablet by mouth daily (Patient taking differently: Take 50 mg by mouth daily. )  . Misc Natural Products (OSTEO BI-FLEX ADV JOINT SHIELD PO) Take 1 tablet by mouth 2 (two) times daily.   . nitroGLYCERIN (NITROSTAT) 0.4 MG SL tablet Place 1 tablet (0.4 mg total) under the tongue every 5 (five) minutes as needed for chest pain.  . Omega-3 Fatty Acids (FISH OIL) 1200 MG CAPS Take 1,200 mg by mouth 2 (two) times daily.  . ranitidine (ZANTAC) 150 MG tablet Take 150 mg by mouth at bedtime.  . Tiotropium Bromide Monohydrate (SPIRIVA RESPIMAT) 2.5 MCG/ACT AERS Inhale 2 puffs into the lungs daily.  [name]. zolpidem (AMBIEN) 5 MG tablet Take 5 mg by mouth at bedtime as needed for sleep.     Allergies:   Sulfa antibiotics   Social History   Tobacco Use  . Smoking status: Never Smoker  . Smokeless tobacco: Never Used  Substance Use Topics  . Alcohol use: No    Alcohol/week: 0.0 standard drinks  . Drug use: No     Family Hx: The patient's family history includes CAD in her father; Cancer in her brother; Diabetes in her brother and father; Heart attack in her father; Heart disease in her mother; Lymphoma in her sister; Syncope episode in her mother; Ulcers in her mother.  ROS:   Please see the history of present illness.    SHOB when she bends down; she had some swelling in her left foot with pain in the ankle All other systems reviewed and are negative.   Prior CV studies:   The following studies were reviewed today:  EF 50-55%,  moderate TR, mild pulm HTN   Labs/Other Tests and Data Reviewed:    EKG:  An ECG dated 6/19 was personally reviewed today and demonstrated:  AFib, nonspecific ST changes, rate controlled  Recent Labs: 11/22/2018: Hemoglobin 11.8; Platelets 132.0   Recent Lipid Panel No results found for: CHOL, TRIG, HDL, CHOLHDL, LDLCALC, LDLDIRECT  Wt Readings from Last 3 Encounters:  04/05/19 117 lb 8 oz (53.3 kg)  11/22/18 116 lb 6.4 oz (52.8 kg)  04/04/18 114 lb (51.7 kg)     Objective:    Vital Signs:  BP 117/61   Pulse 69   Ht 5\' 2"  (1.575 m)   Wt 117 lb 8 oz (53.3 kg)  BMI 21.49 kg/m    VITAL SIGNS:  reviewed GEN:  no acute distress RESPIRATORY:  no shortness of breath PSYCH:  normal affect exam limited by phone format  ASSESSMENT & PLAN:    1. CAD: No angina.  Continue aggressive secondary rpevention with medication 2. AFib: Eliquis for stroke prevention.   3. DOE: She wants a refill for her Spiriva.  SHe does not want to go back to pulmonary.  She asked me to refill the Spiriva and will have her PMD refill it going forward. Known COPD.  4. Hyperlipidemia: The current medical regimen is effective;  continue present plan and medications.   COVID-19 Education: The signs and symptoms of COVID-19 were discussed with the patient and how to seek care for testing (follow up with PCP or arrange E-visit).  The importance of social distancing was discussed today.  Time:   Today, I have spent 25 minutes with the patient with telehealth technology discussing the above problems.     Medication Adjustments/Labs and Tests Ordered: Current medicines are reviewed at length with the patient today.  Concerns regarding medicines are outlined above.   Tests Ordered: No orders of the defined types were placed in this encounter.   Medication Changes: No orders of the defined types were placed in this encounter.   Disposition:  Follow up in 1 year(s)  Signed, Lance Muss, MD   04/05/2019 10:23 AM    [facility]

## 2019-04-05 ENCOUNTER — Other Ambulatory Visit: Payer: Self-pay

## 2019-04-05 ENCOUNTER — Telehealth (INDEPENDENT_AMBULATORY_CARE_PROVIDER_SITE_OTHER): Payer: Medicare Other | Admitting: Interventional Cardiology

## 2019-04-05 ENCOUNTER — Encounter: Payer: Self-pay | Admitting: Interventional Cardiology

## 2019-04-05 VITALS — BP 117/61 | HR 69 | Ht 62.0 in | Wt 117.5 lb

## 2019-04-05 DIAGNOSIS — E782 Mixed hyperlipidemia: Secondary | ICD-10-CM

## 2019-04-05 DIAGNOSIS — R0609 Other forms of dyspnea: Secondary | ICD-10-CM

## 2019-04-05 DIAGNOSIS — I25118 Atherosclerotic heart disease of native coronary artery with other forms of angina pectoris: Secondary | ICD-10-CM

## 2019-04-05 DIAGNOSIS — I10 Essential (primary) hypertension: Secondary | ICD-10-CM

## 2019-04-05 DIAGNOSIS — I4821 Permanent atrial fibrillation: Secondary | ICD-10-CM

## 2019-04-05 DIAGNOSIS — I482 Chronic atrial fibrillation, unspecified: Secondary | ICD-10-CM

## 2019-04-05 DIAGNOSIS — I25119 Atherosclerotic heart disease of native coronary artery with unspecified angina pectoris: Secondary | ICD-10-CM | POA: Diagnosis not present

## 2019-04-05 DIAGNOSIS — J449 Chronic obstructive pulmonary disease, unspecified: Secondary | ICD-10-CM

## 2019-04-05 MED ORDER — TIOTROPIUM BROMIDE MONOHYDRATE 2.5 MCG/ACT IN AERS: 2.0000 | INHALATION_SPRAY | Freq: Every day | RESPIRATORY_TRACT | 1 refills | Status: DC

## 2019-04-05 MED ORDER — METOPROLOL SUCCINATE ER 50 MG PO TB24: 50.0000 mg | ORAL_TABLET | Freq: Every day | ORAL | 3 refills | Status: DC

## 2019-04-05 MED ORDER — APIXABAN 2.5 MG PO TABS: 2.5000 mg | ORAL_TABLET | Freq: Two times a day (BID) | ORAL | 1 refills | Status: DC

## 2019-04-05 MED ORDER — ATORVASTATIN CALCIUM 20 MG PO TABS: 20.0000 mg | ORAL_TABLET | Freq: Every day | ORAL | 3 refills | Status: DC

## 2019-04-05 NOTE — Patient Instructions (Signed)
Medication Instructions:  Your physician recommends that you continue on your current medications as directed. Please refer to the Current Medication list given to you today.  One refill for your Spiriva was sent in to your pharmacy. You will need to follow up with your Primary Care Doctor for future refills.  If you need a refill on your cardiac medications before your next appointment, please call your pharmacy.   Lab work: None Ordered  If you have labs (blood work) drawn today and your tests are completely normal, you will receive your results only by: [name] MyChart Message (if you have MyChart) OR . A paper copy in the mail If you have any lab test that is abnormal or we need to change your treatment, we will call you to review the results.  Testing/Procedures: None ordered  Follow-Up: At [facility], you and your health needs are our priority.  As part of our continuing mission to provide you with exceptional heart care, we have created designated Provider Care Teams.  These Care Teams include your primary Cardiologist (physician) and Advanced Practice Providers (APPs -  Physician Assistants and Nurse Practitioners) who all work together to provide you with the care you need, when you need it. . You will need a follow up appointment in 1 year.  Please call our office 2 months in advance to schedule this appointment.  You may see Everette Rank, MD or one of the following Advanced Practice Providers on your designated Care Team:   . Robbie Lis, PA-C . Dayna Dunn, PA-C . Jacolyn Reedy, PA-C  Any Other Special Instructions Will Be Listed Below (If Applicable).

## 2019-06-18 ENCOUNTER — Other Ambulatory Visit: Payer: Self-pay | Admitting: Interventional Cardiology

## 2019-06-18 NOTE — Telephone Encounter (Signed)
4f 52.8kg Scr 0.78 10/19/18 Lovw/varanasi 04/05/19

## 2019-10-02 ENCOUNTER — Other Ambulatory Visit: Payer: Self-pay | Admitting: Interventional Cardiology

## 2019-10-02 NOTE — Telephone Encounter (Signed)
Prescription refill request for Eliquis received.  Last office visit: 04/05/2019, [place] Scr: 0.78, 10/19/2018, via [place] Age: 85 y.o. Weight: 52.8 kg  Prescription refill sent.

## 2019-11-06 ENCOUNTER — Telehealth: Payer: Self-pay | Admitting: Interventional Cardiology

## 2019-11-06 NOTE — Telephone Encounter (Signed)
Pt takes Eliquis for afib with CHADS2VASc score of 5 (age x2, sex, HTN, CAD), normal renal function. 5 days is an excessive hold for Eliquis, typically we do not hold blood thinners at all for minor dermatologic procedures. The first message mentions that MD did not need pt to stop any of her medications? If she really needs to stop the Eliquis, would hold for just 1 day prior to low risk procedure.

## 2019-11-06 NOTE — Telephone Encounter (Signed)
Sounds like they may be thinking of Plavix, in terms of holding for 5 days.  Agree that we can hold Eliquis for 1 day.  JV

## 2019-11-06 NOTE — Telephone Encounter (Signed)
Called and spoke to the patient. She states that she is scheduled for squamous cell cancer to be removed from her leg on January 13th with Dr. Mayford Knife in [place] [phone]). She states that he has asked her to hold her Eliquis for 5 days prior to her procedure. Patient is wanting to know if this is okay. Made her aware that I will forward to [name] and PharmD for review.

## 2019-11-06 NOTE — Telephone Encounter (Signed)
Left message to call back  

## 2019-11-06 NOTE — Telephone Encounter (Signed)
New Message  Patient is calling in to speak with [name] and/or his nurse about stopping Eliquis 5 days prior to a skin procedure she is having. Patient has squamous cell cancer on her lower leg and she will be having those cells removed. Per patient the dermatologist, Dr. Mayford Knife of [place] [phone]), stated that patient did not need to stop any medications that she is on for this procedure. Patient states that she was not comfortable with this recommendation due to her bleeding so easily and wanted to contact [name] on her own. Please give patient a call back to discuss.

## 2019-11-07 NOTE — Telephone Encounter (Signed)
Called and made patient aware of the information below. Made her aware that if they do require her to hold her Eliquis that she should only hold Eliquis for 1 day and not 5 days.

## 2020-01-23 ENCOUNTER — Other Ambulatory Visit: Payer: Self-pay | Admitting: Pulmonary Disease

## 2020-01-23 DIAGNOSIS — J449 Chronic obstructive pulmonary disease, unspecified: Secondary | ICD-10-CM

## 2020-02-22 ENCOUNTER — Ambulatory Visit: Payer: Medicare Other | Admitting: Pulmonary Disease

## 2020-02-22 ENCOUNTER — Encounter: Payer: Self-pay | Admitting: Pulmonary Disease

## 2020-02-22 ENCOUNTER — Other Ambulatory Visit: Payer: Self-pay

## 2020-02-22 VITALS — BP 118/78 | HR 90 | Temp 97.6°F | Ht 62.5 in | Wt 115.4 lb

## 2020-02-22 DIAGNOSIS — J449 Chronic obstructive pulmonary disease, unspecified: Secondary | ICD-10-CM

## 2020-02-22 NOTE — Progress Notes (Signed)
[name]    [id number]    06-07-1932  Primary Care Physician:Skakle, Eliberto Ivory, DO  Referring Physician: Charlane Ferretti, DO [address] [place],  [place] [address]  Chief complaint: Follow-up for COPD  HPI: 86 year old with COPD.  Previously followed by Dr. Clayborn Bigness He is on Spiriva.  She states that she is doing well with this regimen.  Complains of mild dyspnea on exertion.  No cough, sputum production, fevers, chills.  Pets: Has a dog and a cat.  No birds, farm animals Occupation: Retired Chartered loss adjuster. Exposures: No known exposures, no mold, hot tub, Jacuzzi Smoking history: Never smoker Travel history: No significant travel history Relevant family history: No significant family history of lung disease  Outpatient Encounter Medications as of 02/22/2020  Medication Sig  . acetaminophen (TYLENOL) 500 MG tablet Take 500-1,000 mg by mouth every 6 (six) hours as needed for mild pain.  [name] atorvastatin (LIPITOR) 20 MG tablet Take 1 tablet (20 mg total) by mouth at bedtime.  [name] Bioflavonoid Products (ESTER-C) TABS Take 1 tablet by mouth daily.  [name] BIOTIN PO Take 1 tablet by mouth daily.  . Calcium Carbonate-Vitamin D (CALCIUM-D PO) Take 1 tablet by mouth 2 (two) times daily.   . cholecalciferol (VITAMIN D) 1000 units tablet Take 1,000 Units by mouth daily.  [name] ELIQUIS 2.5 MG TABS tablet TAKE 1 TABLET BY MOUTH TWICE DAILY.  . famotidine (PEPCID) 20 MG tablet famotidine 20 mg tablet  Take 1 tablet twice a day by oral route.  . ferrous sulfate 325 (65 FE) MG tablet Take 325 mg by mouth 3 (three) times a week.   . furosemide (LASIX) 20 MG tablet TAKE 1 TABLET EACH DAY.  [name] Hypromellose (ARTIFICIAL TEARS OP) Place 1 drop into both eyes every 6 (six) hours as needed (dry eyes).  [name] levothyroxine (SYNTHROID, LEVOTHROID) 75 MCG tablet Take 37.5-75 mcg by mouth daily before breakfast. 1 tablet 6 days a week and 1/2 tab every Friday.  . metoprolol succinate (TOPROL-XL) 50 MG 24 hr  tablet Take 1 tablet (50 mg total) by mouth daily.  . Misc Natural Products (OSTEO BI-FLEX ADV JOINT SHIELD PO) Take 1 tablet by mouth 2 (two) times daily.   . nitroGLYCERIN (NITROSTAT) 0.4 MG SL tablet Place 1 tablet (0.4 mg total) under the tongue every 5 (five) minutes as needed for chest pain.  . Omega-3 Fatty Acids (FISH OIL) 1200 MG CAPS Take 1,200 mg by mouth 2 (two) times daily.  [name] SPIRIVA RESPIMAT 2.5 MCG/ACT AERS USE 2 PUFFS ONCE DAILY.  [name] zolpidem (AMBIEN) 5 MG tablet Take 5 mg by mouth at bedtime as needed for sleep.  . [DISCONTINUED] ranitidine (ZANTAC) 150 MG tablet Take 150 mg by mouth at bedtime.   No facility-administered encounter medications on file as of 02/22/2020.   Physical Exam: Blood pressure 128/80, pulse 61, height 5\' 2"  (1.575 m), weight 116 lb 6.4 oz (52.8 kg), SpO2 97 %. Gen:      No acute distress HEENT:  EOMI, sclera anicteric Neck:     No masses; no thyromegaly Lungs:    Clear to auscultation bilaterally; normal respiratory effort CV:         Regular rate and rhythm; no murmurs Abd:      + bowel sounds; soft, non-tender; no palpable masses, no distension Ext:    No edema; adequate peripheral perfusion Skin:      Warm and dry; no rash Neuro: alert and  oriented x 3 Psych: normal mood and affect  Data Reviewed: Imaging: CT chest 10/31/2015 mild bibasilar atelectasis, scarring. Chest x-ray 07/21/2018- probable scarring or pleural thickening in the right lung base. I have reviewed the images personally.  PFTs: 12/17/2015 FVC 2.48 [120%], FEV1 1.09 [72%], F/F 44, TLC 103%, DLCO 68% Moderate obstruction.  Disproportionately blunted expiratory flow loop Minimal diffusion defect  Labs: CBC 08/20/2016-WBC 5.4, eos 3%, absolute eosinophil count 162 CBC 11/22/2018-WBC 4.5, eos 3.9%, absolute eosinophil count 175 IgE 11/22/2018-7,  Alpha-1 antitrypsin 11/22/2018-144, PIMS  Cardiac: Echocardiogram 11/25/2015- LVEF 50-55%, PA peak pressure 45 mm. Nuclear stress  test 11/25/2015- normal study with no ischemia.  Assessment:  Moderate COPD She is a never smoker.  Has a diagnosis of COPD on record Not sure if the obstruction on the PFTs is accurate as there is a abnormal, disproportionately blunted expiratory flow loop suggestive of poor effort.  I have suggested to her that we can repeat the PFTs or spirometry but she does not want to go to the procedure again We will continue Spiriva for now as she feels it is helping with her breathing  She continues to walk and stay active and wants to try breathing exercises Give her a handout to Buteyko method  Alpha-1 antitrypsin heterozygote Has normal levels with PI MS phenotype.  We will continue monitoring.  Plan/Recommendations: - Continue Spiriva - Follow A1AT levels - Buteyko exercises  Marshell Garfinkel MD [facility] 02/22/2020, 11:15 AM  CC: Sueanne Margarita, DO

## 2020-02-22 NOTE — Patient Instructions (Signed)
I am glad you are doing well with regard to your breathing Continue your inhalers and exercise We will give you a handout on breathing exercises to practice to strengthen your breathing  Follow-up in 6 months.

## 2020-03-27 ENCOUNTER — Other Ambulatory Visit: Payer: Self-pay | Admitting: Pharmacist

## 2020-03-27 MED ORDER — APIXABAN 2.5 MG PO TABS: 2.5000 mg | ORAL_TABLET | Freq: Two times a day (BID) | ORAL | 1 refills | Status: DC

## 2020-03-27 NOTE — Progress Notes (Signed)
Age 86, weight 52kg, pt on correct dose. Needs bmet and cbc checked, have not been checked since 2019. Pt has f/u scheduled with Dr Eldridge Dace in a few weeks, will send in refill and recheck labs at that appt

## 2020-04-03 NOTE — Progress Notes (Signed)
Cardiology Office Note   Date:  04/04/2020   ID:  [name], DOB 07-Jul-1932, MRN [id number]  PCP:  Sueanne Margarita, DO    No chief complaint on file.  CAD  Wt Readings from Last 3 Encounters:  04/04/20 110 lb 12.8 oz (50.3 kg)  02/22/20 115 lb 6.4 oz (52.3 kg)  04/05/19 117 lb 8 oz (53.3 kg)       History of Present Illness: [name] is a 86 y.o. female   who had CABG in 2007 for left main disease, by Dr. Roxan Hockey. She had hysterectomy in May 2012 and did well.  Her husband passed away a few years ago.  She has had some hip pain which has limited her walking and dancing.She has a fair amount of pain. Very little relief from the shot she had in her hip.   In Jan 2018, she had a colonoscopy that was ok.  She has bowled in the past and had some DOE with square dancing. Sx were mild and were managed conservatively.   The patient does not have symptoms concerning for COVID-19 infection (fever, chills, cough, or new shortness of breath).   Since the last visit, she has had some problems with leg cramps.  After picking up a watermelon, she had some right arm pain.  SHe saw ortho and sx have improved.    Denies : Chest pain. Dizziness. Leg edema.  Orthopnea. Palpitations. Paroxysmal nocturnal dyspnea. Shortness of breath. Syncope.   Rare NTG use.  She had a left lower chest pain, thought it was indigestion but took a NTG just in case. Pain did resolve.  Different from what she had before CABG.   She has not been back to the gym.  SHe walks in the house.   She has gotten both vaccines, [employer].  No problems.       Past Medical History:  Diagnosis Date  . Chronic atrial fibrillation ([place])    a. Pt declined cardioversion.  . Coronary artery disease    a. s/p CABGx2 06/2006 (LIMA-LAD, SVG-LCx at bifurcation of OM II/III). b. Nuc 11/2012: no evidence of ischemia, attenuation artifact c/w breast attenuation in anterior region, essentially low risk, EF  88%.  . Essential hypertension   . Hyperlipidemia   . Hypothyroidism   . Insomnia    on ambien, failed other meds  . Iron deficiency anemia   . Osteoarthritis   . Osteopenia    involved on BMD 06/01/07 (T score-1.6); slightly worse in 3/13 (T score -1.6; FRAX 3%  and 12%)-plan rechcekin 2016  . Uterine prolapse    post hysterectomy, Dr. Helane Rima  . UTI (lower urinary tract infection) 3/11    Past Surgical History:  Procedure Laterality Date  . APPENDECTOMY    . CATARACT EXTRACTION, BILATERAL  2009  . COLONOSCOPY WITH PROPOFOL N/A 11/30/2016   Procedure: COLONOSCOPY WITH PROPOFOL;  Surgeon: Garlan Fair, MD;  Location: [facility];  Service: Endoscopy;  Laterality: N/A;  . CORONARY ARTERY BYPASS GRAFT  2007  . ESOPHAGOGASTRODUODENOSCOPY N/A 11/30/2016   Procedure: ESOPHAGOGASTRODUODENOSCOPY (EGD);  Surgeon: Garlan Fair, MD;  Location: [facility];  Service: Endoscopy;  Laterality: N/A;  . LIPOMA EXCISION    . OVARIAN CYST REMOVAL    . ROTATOR CUFF REPAIR    . TEAR DUCT PROBING     X2 on left and x1 on right     Current Outpatient Medications  Medication Sig Dispense Refill  . acetaminophen (TYLENOL) 500  MG tablet Take 500-1,000 mg by mouth every 6 (six) hours as needed for mild pain.    [name] alendronate (FOSAMAX) 70 MG tablet Take 70 mg by mouth once a week.     [name] apixaban (ELIQUIS) 2.5 MG TABS tablet Take 1 tablet (2.5 mg total) by mouth 2 (two) times daily. 180 tablet 1  . atorvastatin (LIPITOR) 20 MG tablet Take 1 tablet (20 mg total) by mouth at bedtime. 90 tablet 3  . Bioflavonoid Products (ESTER-C) TABS Take 1 tablet by mouth daily.    [name] BIOTIN PO Take 1 tablet by mouth daily.    . Calcium Carbonate-Vitamin D (CALCIUM-D PO) Take 1 tablet by mouth 2 (two) times daily.     . cholecalciferol (VITAMIN D) 1000 units tablet Take 1,000 Units by mouth daily.    . famotidine (PEPCID) 20 MG tablet famotidine 20 mg tablet  Take 1 tablet twice a day by oral route.    . ferrous  sulfate 325 (65 FE) MG tablet Take 325 mg by mouth 3 (three) times a week.     . furosemide (LASIX) 20 MG tablet TAKE 1 TABLET EACH DAY. 90 tablet 2  . Hypromellose (ARTIFICIAL TEARS OP) Place 1 drop into both eyes every 6 (six) hours as needed (dry eyes).    [name] levothyroxine (SYNTHROID, LEVOTHROID) 75 MCG tablet Take 37.5-75 mcg by mouth daily before breakfast. 1 tablet 6 days a week and 1/2 tab every Friday.    . metoprolol succinate (TOPROL-XL) 50 MG 24 hr tablet Take 1 tablet (50 mg total) by mouth daily. 90 tablet 3  . Misc Natural Products (OSTEO BI-FLEX ADV JOINT SHIELD PO) Take 1 tablet by mouth 2 (two) times daily.     . nitroGLYCERIN (NITROSTAT) 0.4 MG SL tablet Place 1 tablet (0.4 mg total) under the tongue every 5 (five) minutes as needed for chest pain. 25 tablet 0  . Omega-3 Fatty Acids (FISH OIL) 1200 MG CAPS Take 1,200 mg by mouth 2 (two) times daily.    [name] SPIRIVA RESPIMAT 2.5 MCG/ACT AERS USE 2 PUFFS ONCE DAILY. 12 g 1  . zolpidem (AMBIEN) 5 MG tablet Take 5 mg by mouth at bedtime as needed for sleep.     No current facility-administered medications for this visit.    Allergies:   Sulfa antibiotics    Social History:  The patient  reports that she has never smoked. She has never used smokeless tobacco. She reports that she does not drink alcohol or use drugs.   Family History:  The patient's family history includes CAD in her father; Cancer in her brother; Diabetes in her brother and father; Heart attack in her father; Heart disease in her mother; Lymphoma in her sister; Syncope episode in her mother; Ulcers in her mother.    ROS:  Please see the history of present illness.   Otherwise, review of systems are positive for leg cramps.   All other systems are reviewed and negative.    PHYSICAL EXAM: VS:  BP 106/76   Pulse 70   Ht 5' 2.5" (1.588 m)   Wt 110 lb 12.8 oz (50.3 kg)   SpO2 98%   BMI 19.94 kg/m  , BMI Body mass index is 19.94 kg/m. GEN: Well nourished, well  developed, in no acute distress  HEENT: normal  Neck: no JVD, carotid bruits, or masses Cardiac: irregularly irregular; no murmurs, rubs, or gallops,no edema  Respiratory:  clear to auscultation bilaterally, normal work of breathing GI: soft,  nontender, nondistended, + BS MS: no deformity or atrophy  Skin: warm and dry, no rash Neuro:  Strength and sensation are intact Psych: euthymic mood, full affect   EKG:   The ekg ordered today demonstrates AFib, normal rate   Recent Labs: No results found for requested labs within last 8760 hours.   Lipid Panel No results found for: CHOL, TRIG, HDL, CHOLHDL, VLDL, LDLCALC, LDLDIRECT   Other studies Reviewed: Additional studies/ records that were reviewed today with results demonstrating: PMD labs reviewed. 10/2019 lipids controlled.   ASSESSMENT AND PLAN:  1. CAD: Continue aggressive secondary prevention.  Refill SL NTG.  2. Atrial fibrillation: Eliquis for stroke prevention.  Rate controlled. 3. Shortness of breath: Known COPD.  This has been managed with inhalers.  Stable sx.  4. Hyperlipidemia: lipids well controlled.  COntinue atorvastatin.  5. For leg cramps, she should stay hydrated.  Labs are coming up with PMD. Now seeing Dr. Thornell Mule.      Current medicines are reviewed at length with the patient today.  The patient concerns regarding her medicines were addressed.  The following changes have been made:  No change  Labs/ tests ordered today include:  No orders of the defined types were placed in this encounter.   Recommend 150 minutes/week of aerobic exercise Low fat, low carb, high fiber diet recommended  Disposition:   FU in 1 year   Signed, Lance Muss, MD  04/04/2020 10:21 AM    [facility] [address] [place], [place], [place]  [address] Phone: [phone]; Fax: [phone]

## 2020-04-04 ENCOUNTER — Ambulatory Visit: Payer: Medicare Other | Admitting: Interventional Cardiology

## 2020-04-04 ENCOUNTER — Encounter: Payer: Self-pay | Admitting: Interventional Cardiology

## 2020-04-04 ENCOUNTER — Other Ambulatory Visit: Payer: Self-pay

## 2020-04-04 VITALS — BP 106/76 | HR 70 | Ht 62.5 in | Wt 110.8 lb

## 2020-04-04 DIAGNOSIS — R0600 Dyspnea, unspecified: Secondary | ICD-10-CM

## 2020-04-04 DIAGNOSIS — I25118 Atherosclerotic heart disease of native coronary artery with other forms of angina pectoris: Secondary | ICD-10-CM

## 2020-04-04 DIAGNOSIS — I4821 Permanent atrial fibrillation: Secondary | ICD-10-CM

## 2020-04-04 DIAGNOSIS — E782 Mixed hyperlipidemia: Secondary | ICD-10-CM

## 2020-04-04 DIAGNOSIS — R0609 Other forms of dyspnea: Secondary | ICD-10-CM

## 2020-04-04 MED ORDER — FUROSEMIDE 20 MG PO TABS: ORAL_TABLET | ORAL | 3 refills | Status: DC

## 2020-04-04 MED ORDER — APIXABAN 2.5 MG PO TABS: 2.5000 mg | ORAL_TABLET | Freq: Two times a day (BID) | ORAL | 0 refills | Status: DC

## 2020-04-04 NOTE — Patient Instructions (Signed)
Medication Instructions:  Your physician recommends that you continue on your current medications as directed. Please refer to the Current Medication list given to you today.  *If you need a refill on your cardiac medications before your next appointment, please call your pharmacy*   Lab Work: Please have a BMET and CBC checked with your Primary Care Doctor  If you have labs (blood work) drawn today and your tests are completely normal, you will receive your results only by: [name] MyChart Message (if you have MyChart) OR . A paper copy in the mail If you have any lab test that is abnormal or we need to change your treatment, we will call you to review the results.   Testing/Procedures: None ordered   Follow-Up: At [facility], you and your health needs are our priority.  As part of our continuing mission to provide you with exceptional heart care, we have created designated Provider Care Teams.  These Care Teams include your primary Cardiologist (physician) and Advanced Practice Providers (APPs -  Physician Assistants and Nurse Practitioners) who all work together to provide you with the care you need, when you need it.  We recommend signing up for the patient portal called "MyChart".  Sign up information is provided on this After Visit Summary.  MyChart is used to connect with patients for Virtual Visits (Telemedicine).  Patients are able to view lab/test results, encounter notes, upcoming appointments, etc.  Non-urgent messages can be sent to your provider as well.   To learn more about what you can do with MyChart, go to [email or web address].    Your next appointment:   12 month(s)  The format for your next appointment:   In Person  Provider:   You may see Lance Muss, MD or one of the following Advanced Practice Providers on your designated Care Team:    Ronie Spies, PA-C  Jacolyn Reedy, PA-C    Other Instructions None

## 2020-04-29 ENCOUNTER — Other Ambulatory Visit: Payer: Self-pay | Admitting: Interventional Cardiology

## 2020-04-29 DIAGNOSIS — I10 Essential (primary) hypertension: Secondary | ICD-10-CM

## 2020-07-22 ENCOUNTER — Other Ambulatory Visit: Payer: Self-pay | Admitting: Interventional Cardiology

## 2020-07-22 DIAGNOSIS — I10 Essential (primary) hypertension: Secondary | ICD-10-CM

## 2020-08-21 ENCOUNTER — Telehealth: Payer: Self-pay | Admitting: Pulmonary Disease

## 2020-08-21 DIAGNOSIS — J449 Chronic obstructive pulmonary disease, unspecified: Secondary | ICD-10-CM

## 2020-08-21 MED ORDER — SPIRIVA RESPIMAT 2.5 MCG/ACT IN AERS: 2.0000 | INHALATION_SPRAY | Freq: Every day | RESPIRATORY_TRACT | 1 refills | Status: DC

## 2020-08-21 NOTE — Telephone Encounter (Signed)
Called and spoke with Patient.  Patient requested a refill for Spiriva to be sent to [facility] in [place].  Prescription sent to requested pharmacy.  Nothing further at this time.

## 2020-09-02 ENCOUNTER — Ambulatory Visit: Payer: Medicare PPO | Attending: Internal Medicine

## 2020-09-02 DIAGNOSIS — Z23 Encounter for immunization: Secondary | ICD-10-CM

## 2020-09-02 NOTE — Progress Notes (Signed)
   Covid-19 Vaccination Clinic  Name:  [name]    MRN: [id number] DOB: November 05, 1931  09/02/2020  [name] was observed post Covid-19 immunization for 15 minutes without incident. She was provided with Vaccine Information Sheet and instruction to access the V-Safe system.   [name] was instructed to call 911 with any severe reactions post vaccine: [name] Difficulty breathing  . Swelling of face and throat  . A fast heartbeat  . A bad rash all over body  . Dizziness and weakness

## 2020-10-08 ENCOUNTER — Other Ambulatory Visit: Payer: Self-pay | Admitting: Interventional Cardiology

## 2020-10-08 DIAGNOSIS — Z1231 Encounter for screening mammogram for malignant neoplasm of breast: Secondary | ICD-10-CM | POA: Diagnosis not present

## 2020-10-08 NOTE — Telephone Encounter (Signed)
Prescription refill request for Eliquis received. Indication: Aifb Last office visit: [name], 04/04/2020 Scr: 04/16/2020 0.8 Age: 86 Weight: 50.3 kg   Prescription refill sent.

## 2020-10-10 DIAGNOSIS — E785 Hyperlipidemia, unspecified: Secondary | ICD-10-CM | POA: Diagnosis not present

## 2020-10-10 DIAGNOSIS — D509 Iron deficiency anemia, unspecified: Secondary | ICD-10-CM | POA: Diagnosis not present

## 2020-10-10 DIAGNOSIS — M859 Disorder of bone density and structure, unspecified: Secondary | ICD-10-CM | POA: Diagnosis not present

## 2020-10-10 DIAGNOSIS — E039 Hypothyroidism, unspecified: Secondary | ICD-10-CM | POA: Diagnosis not present

## 2020-10-17 DIAGNOSIS — D696 Thrombocytopenia, unspecified: Secondary | ICD-10-CM | POA: Diagnosis not present

## 2020-10-17 DIAGNOSIS — J449 Chronic obstructive pulmonary disease, unspecified: Secondary | ICD-10-CM | POA: Diagnosis not present

## 2020-10-17 DIAGNOSIS — Z0000 Encounter for general adult medical examination without abnormal findings: Secondary | ICD-10-CM | POA: Diagnosis not present

## 2020-10-17 DIAGNOSIS — D692 Other nonthrombocytopenic purpura: Secondary | ICD-10-CM | POA: Diagnosis not present

## 2020-10-17 DIAGNOSIS — I2510 Atherosclerotic heart disease of native coronary artery without angina pectoris: Secondary | ICD-10-CM | POA: Diagnosis not present

## 2020-10-17 DIAGNOSIS — D509 Iron deficiency anemia, unspecified: Secondary | ICD-10-CM | POA: Diagnosis not present

## 2020-10-17 DIAGNOSIS — I482 Chronic atrial fibrillation, unspecified: Secondary | ICD-10-CM | POA: Diagnosis not present

## 2020-10-17 DIAGNOSIS — E039 Hypothyroidism, unspecified: Secondary | ICD-10-CM | POA: Diagnosis not present

## 2020-10-17 DIAGNOSIS — R82998 Other abnormal findings in urine: Secondary | ICD-10-CM | POA: Diagnosis not present

## 2020-10-17 DIAGNOSIS — I10 Essential (primary) hypertension: Secondary | ICD-10-CM | POA: Diagnosis not present

## 2020-10-28 DIAGNOSIS — L814 Other melanin hyperpigmentation: Secondary | ICD-10-CM | POA: Diagnosis not present

## 2020-10-28 DIAGNOSIS — L821 Other seborrheic keratosis: Secondary | ICD-10-CM | POA: Diagnosis not present

## 2020-10-28 DIAGNOSIS — D224 Melanocytic nevi of scalp and neck: Secondary | ICD-10-CM | POA: Diagnosis not present

## 2020-12-30 DIAGNOSIS — L814 Other melanin hyperpigmentation: Secondary | ICD-10-CM | POA: Diagnosis not present

## 2020-12-30 DIAGNOSIS — L570 Actinic keratosis: Secondary | ICD-10-CM | POA: Diagnosis not present

## 2020-12-30 DIAGNOSIS — D485 Neoplasm of uncertain behavior of skin: Secondary | ICD-10-CM | POA: Diagnosis not present

## 2021-01-07 DIAGNOSIS — C44729 Squamous cell carcinoma of skin of left lower limb, including hip: Secondary | ICD-10-CM | POA: Diagnosis not present

## 2021-02-10 ENCOUNTER — Other Ambulatory Visit: Payer: Self-pay | Admitting: Pulmonary Disease

## 2021-02-10 DIAGNOSIS — J449 Chronic obstructive pulmonary disease, unspecified: Secondary | ICD-10-CM

## 2021-02-24 DIAGNOSIS — H524 Presbyopia: Secondary | ICD-10-CM | POA: Diagnosis not present

## 2021-02-24 DIAGNOSIS — H43813 Vitreous degeneration, bilateral: Secondary | ICD-10-CM | POA: Diagnosis not present

## 2021-02-24 DIAGNOSIS — H04221 Epiphora due to insufficient drainage, right lacrimal gland: Secondary | ICD-10-CM | POA: Diagnosis not present

## 2021-02-24 DIAGNOSIS — H02102 Unspecified ectropion of right lower eyelid: Secondary | ICD-10-CM | POA: Diagnosis not present

## 2021-02-25 ENCOUNTER — Encounter: Payer: Self-pay | Admitting: Pulmonary Disease

## 2021-02-25 ENCOUNTER — Ambulatory Visit (INDEPENDENT_AMBULATORY_CARE_PROVIDER_SITE_OTHER): Payer: Medicare PPO

## 2021-02-25 ENCOUNTER — Other Ambulatory Visit: Payer: Self-pay

## 2021-02-25 ENCOUNTER — Ambulatory Visit: Payer: Medicare PPO | Admitting: Pulmonary Disease

## 2021-02-25 VITALS — BP 108/70 | HR 63 | Temp 97.7°F | Ht 62.5 in | Wt 113.8 lb

## 2021-02-25 DIAGNOSIS — J449 Chronic obstructive pulmonary disease, unspecified: Secondary | ICD-10-CM

## 2021-02-25 DIAGNOSIS — I517 Cardiomegaly: Secondary | ICD-10-CM | POA: Diagnosis not present

## 2021-02-25 DIAGNOSIS — J439 Emphysema, unspecified: Secondary | ICD-10-CM | POA: Diagnosis not present

## 2021-02-25 NOTE — Patient Instructions (Signed)
We will get a chest x-ray today Check alpha-1 antitrypsin phenotype levels  Follow-up in 1 year.

## 2021-02-25 NOTE — Addendum Note (Signed)
Addended by: Demetrio Lapping E on: 02/25/2021 10:25 AM   Modules accepted: Orders

## 2021-02-25 NOTE — Progress Notes (Signed)
[name]    [id number]    [birth year]  Primary Care Physician:Skakle, Eliberto Ivory DO  Referring Physician: Charlane Ferretti, DO [address] [place],  [place] [address]  Chief complaint: Follow-up for COPD  HPI: 87 year old with COPD.  He is on Spiriva.  She states that she is doing well with this regimen.  Complains of mild dyspnea on exertion.  No cough, sputum production, fevers, chills.  Pets: Has a dog and a cat.  No birds, farm animals Occupation: Retired Chartered loss adjuster. Exposures: No known exposures, no mold, hot tub, Jacuzzi Smoking history: Never smoker Travel history: No significant travel history Relevant family history: No significant family history of lung disease  Interim history: She is doing well States that breathing is actually better Continues on Spiriva which is helping.  Outpatient Encounter Medications as of 02/25/2021  Medication Sig  . acetaminophen (TYLENOL) 500 MG tablet Take 500-1,000 mg by mouth every 6 (six) hours as needed for mild pain.  [name] alendronate (FOSAMAX) 70 MG tablet Take 70 mg by mouth once a week.   [name] atorvastatin (LIPITOR) 20 MG tablet TAKE 1 TABLET ONCE DAILY.  [name] Bioflavonoid Products (ESTER-C) TABS Take 1 tablet by mouth daily.  [name] BIOTIN PO Take 1 tablet by mouth daily.  [name] CALCIUM CARBONATE-VIT D-MIN PO Take by mouth daily.  [name] ELIQUIS 2.5 MG TABS tablet TAKE 1 TABLET BY MOUTH TWICE A DAY  . famotidine (PEPCID) 20 MG tablet famotidine 20 mg tablet  Take 1 tablet twice a day by oral route.  . ferrous sulfate 325 (65 FE) MG tablet Take 325 mg by mouth 3 (three) times a week.   . furosemide (LASIX) 20 MG tablet TAKE 1 TABLET EACH DAY.  [name] Hypromellose (ARTIFICIAL TEARS OP) Place 1 drop into both eyes every 6 (six) hours as needed (dry eyes).  [name] levothyroxine (SYNTHROID, LEVOTHROID) 75 MCG tablet Take 37.5-75 mcg by mouth daily before breakfast. 1 tablet 6 days a week and 1/2 tab every Friday.  . metoprolol succinate (TOPROL-XL) 50  MG 24 hr tablet Take 1 tablet (50 mg total) by mouth daily.  . Misc Natural Products (OSTEO BI-FLEX ADV JOINT SHIELD PO) Take 1 tablet by mouth 2 (two) times daily.   . nitroGLYCERIN (NITROSTAT) 0.4 MG SL tablet Place 1 tablet (0.4 mg total) under the tongue every 5 (five) minutes as needed for chest pain.  . Omega-3 Fatty Acids (FISH OIL) 1200 MG CAPS Take 1,200 mg by mouth 2 (two) times daily.  [name] SPIRIVA RESPIMAT 2.5 MCG/ACT AERS INHALE 2 PUFFS BY MOUTH INTO THE LUNGS DAILY  . zolpidem (AMBIEN) 5 MG tablet Take 5 mg by mouth at bedtime as needed for sleep.  . [DISCONTINUED] Calcium Carbonate-Vitamin D (CALCIUM-D PO) Take 1 tablet by mouth 2 (two) times daily.   . [DISCONTINUED] cholecalciferol (VITAMIN D) 1000 units tablet Take 1,000 Units by mouth daily.   No facility-administered encounter medications on file as of 02/25/2021.   Physical Exam: Blood pressure 108/70, pulse 63, temperature 97.7 F (36.5 C), temperature source Temporal, height 5' 2.5" (1.588 m), weight 113 lb 12.8 oz (51.6 kg), SpO2 98 %. Gen:      No acute distress HEENT:  EOMI, sclera anicteric Neck:     No masses; no thyromegaly Lungs:    Clear to auscultation bilaterally; normal respiratory effort CV:         Regular rate and rhythm; no murmurs Abd:      +  bowel sounds; soft, non-tender; no palpable masses, no distension Ext:    No edema; adequate peripheral perfusion Skin:      Warm and dry; no rash Neuro: alert and oriented x 3 Psych: normal mood and affect  Data Reviewed: Imaging: CT chest 10/31/2015 mild bibasilar atelectasis, scarring. Chest x-ray 07/21/2018- probable scarring or pleural thickening in the right lung base. I have reviewed the images personally.  PFTs: 12/17/2015 FVC 2.48 [120%], FEV1 1.09 [72%], F/F 44, TLC 103%, DLCO 68% Moderate obstruction.  Disproportionately blunted expiratory flow loop Minimal diffusion defect  Labs: CBC 08/20/2016-WBC 5.4, eos 3%, absolute eosinophil count 162 CBC  11/22/2018-WBC 4.5, eos 3.9%, absolute eosinophil count 175 IgE 11/22/2018-7,  Alpha-1 antitrypsin 11/22/2018-144, PIMS  Cardiac: Echocardiogram 11/25/2015- LVEF 50-55%, PA peak pressure 45 mm. Nuclear stress test 11/25/2015- normal study with no ischemia.  Assessment:  Moderate COPD She is a never smoker.  Has a diagnosis of COPD on record Not sure if the obstruction on the PFTs is accurate as there is a abnormal, disproportionately blunted expiratory flow loop suggestive of poor effort.  I have suggested to her that we can repeat the PFTs or spirometry but she does not want to go to the procedure again We will continue Spiriva for now as she feels it is helping with her breathing  Continues to stay active and is doing breathing exercises at home  Alpha-1 antitrypsin heterozygote Has normal levels with PI MS phenotype.  We will continue monitoring.  Plan/Recommendations: - Continue Spiriva - Check A1AT levels  Chilton Greathouse MD [facility] 02/25/2021, 10:02 AM  CC: Charlane Ferretti, DO

## 2021-02-26 LAB — ALPHA-1-ANTITRYPSIN: A-1 Antitrypsin, Ser: 171 mg/dL (ref 83–199)

## 2021-03-04 ENCOUNTER — Encounter: Payer: Self-pay | Admitting: *Deleted

## 2021-04-10 ENCOUNTER — Ambulatory Visit: Payer: Medicare PPO | Admitting: Interventional Cardiology

## 2021-04-16 ENCOUNTER — Other Ambulatory Visit: Payer: Self-pay | Admitting: Interventional Cardiology

## 2021-04-16 DIAGNOSIS — I10 Essential (primary) hypertension: Secondary | ICD-10-CM

## 2021-04-16 NOTE — Telephone Encounter (Signed)
Rx(s) sent to pharmacy electronically.  

## 2021-04-29 DIAGNOSIS — I25118 Atherosclerotic heart disease of native coronary artery with other forms of angina pectoris: Secondary | ICD-10-CM | POA: Diagnosis not present

## 2021-04-29 DIAGNOSIS — M9906 Segmental and somatic dysfunction of lower extremity: Secondary | ICD-10-CM | POA: Diagnosis not present

## 2021-04-29 DIAGNOSIS — D696 Thrombocytopenia, unspecified: Secondary | ICD-10-CM | POA: Diagnosis not present

## 2021-04-29 DIAGNOSIS — M9905 Segmental and somatic dysfunction of pelvic region: Secondary | ICD-10-CM | POA: Diagnosis not present

## 2021-04-29 DIAGNOSIS — M25559 Pain in unspecified hip: Secondary | ICD-10-CM | POA: Diagnosis not present

## 2021-04-29 DIAGNOSIS — M810 Age-related osteoporosis without current pathological fracture: Secondary | ICD-10-CM | POA: Diagnosis not present

## 2021-04-29 DIAGNOSIS — M9903 Segmental and somatic dysfunction of lumbar region: Secondary | ICD-10-CM | POA: Diagnosis not present

## 2021-04-29 DIAGNOSIS — M9904 Segmental and somatic dysfunction of sacral region: Secondary | ICD-10-CM | POA: Diagnosis not present

## 2021-04-29 DIAGNOSIS — I482 Chronic atrial fibrillation, unspecified: Secondary | ICD-10-CM | POA: Diagnosis not present

## 2021-04-29 DIAGNOSIS — I10 Essential (primary) hypertension: Secondary | ICD-10-CM | POA: Diagnosis not present

## 2021-04-29 DIAGNOSIS — D6869 Other thrombophilia: Secondary | ICD-10-CM | POA: Diagnosis not present

## 2021-04-29 DIAGNOSIS — J449 Chronic obstructive pulmonary disease, unspecified: Secondary | ICD-10-CM | POA: Diagnosis not present

## 2021-04-29 DIAGNOSIS — D509 Iron deficiency anemia, unspecified: Secondary | ICD-10-CM | POA: Diagnosis not present

## 2021-05-24 NOTE — Progress Notes (Signed)
Cardiology Office Note   Date:  05/25/2021   ID:  [name], DOB August 10, 1932, MRN [id number]  PCP:  Marisa Ferretti, DO    No chief complaint on file.  CAD  Wt Readings from Last 3 Encounters:  05/25/21 113 lb 12.8 oz (51.6 kg)  02/25/21 113 lb 12.8 oz (51.6 kg)  04/04/20 110 lb 12.8 oz (50.3 kg)       History of Present Illness: [name] is a 87 y.o. female   who had CABG in 2007 for left main disease, by Dr. Dorris Walker. She had hysterectomy in May 2012 and did well.     Her husband passed away a few years ago.   She has had some hip pain which has limited her walking and dancing. She has a fair amount of pain.  Very little relief from the shot she had in her hip.   In Jan 2018, she had a colonoscopy that was ok.     She has bowled in the past and had some DOE with square dancing.  Sx were mild and were managed conservatively.  She has had some atypical chest pains over the years.   She got a puppy to help encourage activity.  She has stopped sleeping pills as well.   Denies : Chest pain. Dizziness. Leg edema. Nitroglycerin use. Orthopnea. Palpitations. Paroxysmal nocturnal dyspnea.  Syncope.      Past Medical History:  Diagnosis Date   Chronic atrial fibrillation (HCC)    a. Pt declined cardioversion.   Coronary artery disease    a. s/p CABGx2 06/2006 (LIMA-LAD, SVG-LCx at bifurcation of OM II/III). b. Nuc 11/2012: no evidence of ischemia, attenuation artifact c/w breast attenuation in anterior region, essentially low risk, EF 88%.   Essential hypertension    Hyperlipidemia    Hypothyroidism    Insomnia    on ambien, failed other meds   Iron deficiency anemia    Osteoarthritis    Osteopenia    involved on BMD 06/01/07 (T score-1.6); slightly worse in 3/13 (T score -1.6; FRAX 3%  and 12%)-plan rechcekin 2016   Uterine prolapse    post hysterectomy, Dr. Vincente Walker   UTI (lower urinary tract infection) 3/11    Past Surgical History:  Procedure Laterality  Date   APPENDECTOMY     CATARACT EXTRACTION, BILATERAL  2009   COLONOSCOPY WITH PROPOFOL N/A 11/30/2016   Procedure: COLONOSCOPY WITH PROPOFOL;  Surgeon: Marisa Bumpers, MD;  Location: [facility];  Service: Endoscopy;  Laterality: N/A;   CORONARY ARTERY BYPASS GRAFT  2007   ESOPHAGOGASTRODUODENOSCOPY N/A 11/30/2016   Procedure: ESOPHAGOGASTRODUODENOSCOPY (EGD);  Surgeon: Marisa Bumpers, MD;  Location: [facility];  Service: Endoscopy;  Laterality: N/A;   LIPOMA EXCISION     OVARIAN CYST REMOVAL     ROTATOR CUFF REPAIR     TEAR DUCT PROBING     X2 on left and x1 on right     Current Outpatient Medications  Medication Sig Dispense Refill   acetaminophen (TYLENOL) 500 MG tablet Take 500-1,000 mg by mouth every 6 (six) hours as needed for mild pain.     alendronate (FOSAMAX) 70 MG tablet Take 70 mg by mouth once a week.      atorvastatin (LIPITOR) 20 MG tablet TAKE 1 TABLET BY MOUTH ONCE DAILY 90 tablet 0   Bioflavonoid Products (ESTER-C) TABS Take 1 tablet by mouth daily.     BIOTIN PO Take 1 tablet by mouth daily.  CALCIUM CARBONATE-VIT D-MIN PO Take by mouth daily.     ELIQUIS 2.5 MG TABS tablet TAKE 1 TABLET BY MOUTH TWICE A DAY 180 tablet 1   famotidine (PEPCID) 20 MG tablet famotidine 20 mg tablet  Take 1 tablet twice a day by oral route.     ferrous sulfate 325 (65 FE) MG tablet Take 325 mg by mouth 3 (three) times a week.      furosemide (LASIX) 20 MG tablet TAKE 1 TABLET EACH DAY. 90 tablet 3   Hypromellose (ARTIFICIAL TEARS OP) Place 1 drop into both eyes every 6 (six) hours as needed (dry eyes).     levothyroxine (SYNTHROID, LEVOTHROID) 75 MCG tablet Take 37.5-75 mcg by mouth daily before breakfast. 1 tablet 6 days a week and 1/2 tab every Friday.     metoprolol succinate (TOPROL-XL) 50 MG 24 hr tablet Take 1 tablet (50 mg total) by mouth daily. 90 tablet 3   Misc Natural Products (OSTEO BI-FLEX ADV JOINT SHIELD PO) Take 1 tablet by mouth 2 (two) times daily.       nitroGLYCERIN (NITROSTAT) 0.4 MG SL tablet Place 1 tablet (0.4 mg total) under the tongue every 5 (five) minutes as needed for chest pain. 25 tablet 0   Omega-3 Fatty Acids (FISH OIL) 1200 MG CAPS Take 1,200 mg by mouth 2 (two) times daily.     SPIRIVA RESPIMAT 2.5 MCG/ACT AERS INHALE 2 PUFFS BY MOUTH INTO THE LUNGS DAILY 4 g 5   zolpidem (AMBIEN) 5 MG tablet Take 5 mg by mouth at bedtime as needed for sleep.     No current facility-administered medications for this visit.    Allergies:   Sulfa antibiotics    Social History:  The patient  reports that she has never smoked. She has never used smokeless tobacco. She reports that she does not drink alcohol and does not use drugs.   Family History:  The patient's family history includes CAD in her father; Cancer in her brother; Diabetes in her brother and father; Heart attack in her father; Heart disease in her mother; Lymphoma in her sister; Syncope episode in her mother; Ulcers in her mother.    ROS:  Please see the history of present illness.   Otherwise, review of systems are positive for back and hip pain; injection in hip did not help.   All other systems are reviewed and negative.    PHYSICAL EXAM: VS:  BP 132/70   Pulse 75   Ht 5' 2.5" (1.588 m)   Wt 113 lb 12.8 oz (51.6 kg)   SpO2 97%   BMI 20.48 kg/m  , BMI Body mass index is 20.48 kg/m. GEN: Well nourished, well developed, in no acute distress HEENT: normal Neck: no JVD, carotid bruits, or masses Cardiac: irregularly irregular; no murmurs, rubs, or gallops,no edema  Respiratory:  clear to auscultation bilaterally, normal work of breathing GI: soft, nontender, nondistended, + BS MS: no deformity or atrophy Skin: warm and dry, no rash Neuro:  Strength and sensation are intact Psych: euthymic mood, full affect   EKG:   The ekg ordered today demonstrates AFib, rate controlled, nonspecific ST flattening   Recent Labs: No results found for requested labs within last 8760  hours.   Lipid Panel No results found for: CHOL, TRIG, HDL, CHOLHDL, VLDL, LDLCALC, LDLDIRECT   Other studies Reviewed: Additional studies/ records that were reviewed today with results demonstrating: labs reviewed.   ASSESSMENT AND PLAN:  CAD: No clear angina on  medical therapy.  Starting PT for back and hip.   Atrial fibrillation: Rate controlled.  The current medical regimen is effective;  continue present plan and medications. Anticoagulated: Eliquis for stroke prevention.   Hyperlipidemia: LDL 50 in 2021.  Whole food, plant based diet. Shortness of breath: Chronic.  Increase activity.  SHe walks the dog, but I encouraged her to try to keep a constant level of exertion. COPD/emphysema noted by CXR. On Spiriva.   Current medicines are reviewed at length with the patient today.  The patient concerns regarding her medicines were addressed.  The following changes have been made:  No change  Labs/ tests ordered today include:  No orders of the defined types were placed in this encounter.   Recommend 150 minutes/week of aerobic exercise Low fat, low carb, high fiber diet recommended  Disposition:   FU in 1 year   Signed, Lance Muss, MD  05/25/2021 1:47 PM    [facility] [address] [place], [place], [place]  [address] Phone: [phone]; Fax: [phone]

## 2021-05-25 ENCOUNTER — Encounter (INDEPENDENT_AMBULATORY_CARE_PROVIDER_SITE_OTHER): Payer: Self-pay

## 2021-05-25 ENCOUNTER — Encounter: Payer: Self-pay | Admitting: Interventional Cardiology

## 2021-05-25 ENCOUNTER — Ambulatory Visit: Payer: Medicare PPO | Admitting: Interventional Cardiology

## 2021-05-25 ENCOUNTER — Other Ambulatory Visit: Payer: Self-pay

## 2021-05-25 VITALS — BP 132/70 | HR 75 | Ht 62.5 in | Wt 113.8 lb

## 2021-05-25 DIAGNOSIS — R0600 Dyspnea, unspecified: Secondary | ICD-10-CM | POA: Diagnosis not present

## 2021-05-25 DIAGNOSIS — I25118 Atherosclerotic heart disease of native coronary artery with other forms of angina pectoris: Secondary | ICD-10-CM

## 2021-05-25 DIAGNOSIS — E782 Mixed hyperlipidemia: Secondary | ICD-10-CM

## 2021-05-25 DIAGNOSIS — I4821 Permanent atrial fibrillation: Secondary | ICD-10-CM

## 2021-05-25 DIAGNOSIS — R0609 Other forms of dyspnea: Secondary | ICD-10-CM

## 2021-05-25 NOTE — Patient Instructions (Signed)

## 2021-05-27 DIAGNOSIS — M62552 Muscle wasting and atrophy, not elsewhere classified, left thigh: Secondary | ICD-10-CM | POA: Diagnosis not present

## 2021-05-27 DIAGNOSIS — M25652 Stiffness of left hip, not elsewhere classified: Secondary | ICD-10-CM | POA: Diagnosis not present

## 2021-05-27 DIAGNOSIS — R269 Unspecified abnormalities of gait and mobility: Secondary | ICD-10-CM | POA: Diagnosis not present

## 2021-05-27 DIAGNOSIS — M25552 Pain in left hip: Secondary | ICD-10-CM | POA: Diagnosis not present

## 2021-06-01 DIAGNOSIS — M25552 Pain in left hip: Secondary | ICD-10-CM | POA: Diagnosis not present

## 2021-06-01 DIAGNOSIS — M25652 Stiffness of left hip, not elsewhere classified: Secondary | ICD-10-CM | POA: Diagnosis not present

## 2021-06-01 DIAGNOSIS — R269 Unspecified abnormalities of gait and mobility: Secondary | ICD-10-CM | POA: Diagnosis not present

## 2021-06-01 DIAGNOSIS — M62552 Muscle wasting and atrophy, not elsewhere classified, left thigh: Secondary | ICD-10-CM | POA: Diagnosis not present

## 2021-06-03 DIAGNOSIS — M25652 Stiffness of left hip, not elsewhere classified: Secondary | ICD-10-CM | POA: Diagnosis not present

## 2021-06-03 DIAGNOSIS — M25552 Pain in left hip: Secondary | ICD-10-CM | POA: Diagnosis not present

## 2021-06-03 DIAGNOSIS — R269 Unspecified abnormalities of gait and mobility: Secondary | ICD-10-CM | POA: Diagnosis not present

## 2021-06-03 DIAGNOSIS — M62552 Muscle wasting and atrophy, not elsewhere classified, left thigh: Secondary | ICD-10-CM | POA: Diagnosis not present

## 2021-06-05 DIAGNOSIS — R269 Unspecified abnormalities of gait and mobility: Secondary | ICD-10-CM | POA: Diagnosis not present

## 2021-06-05 DIAGNOSIS — M25652 Stiffness of left hip, not elsewhere classified: Secondary | ICD-10-CM | POA: Diagnosis not present

## 2021-06-05 DIAGNOSIS — M25552 Pain in left hip: Secondary | ICD-10-CM | POA: Diagnosis not present

## 2021-06-05 DIAGNOSIS — M62552 Muscle wasting and atrophy, not elsewhere classified, left thigh: Secondary | ICD-10-CM | POA: Diagnosis not present

## 2021-06-08 DIAGNOSIS — M25652 Stiffness of left hip, not elsewhere classified: Secondary | ICD-10-CM | POA: Diagnosis not present

## 2021-06-08 DIAGNOSIS — M62552 Muscle wasting and atrophy, not elsewhere classified, left thigh: Secondary | ICD-10-CM | POA: Diagnosis not present

## 2021-06-08 DIAGNOSIS — M25552 Pain in left hip: Secondary | ICD-10-CM | POA: Diagnosis not present

## 2021-06-08 DIAGNOSIS — R269 Unspecified abnormalities of gait and mobility: Secondary | ICD-10-CM | POA: Diagnosis not present

## 2021-06-10 DIAGNOSIS — R269 Unspecified abnormalities of gait and mobility: Secondary | ICD-10-CM | POA: Diagnosis not present

## 2021-06-10 DIAGNOSIS — M25652 Stiffness of left hip, not elsewhere classified: Secondary | ICD-10-CM | POA: Diagnosis not present

## 2021-06-10 DIAGNOSIS — M62552 Muscle wasting and atrophy, not elsewhere classified, left thigh: Secondary | ICD-10-CM | POA: Diagnosis not present

## 2021-06-10 DIAGNOSIS — M25552 Pain in left hip: Secondary | ICD-10-CM | POA: Diagnosis not present

## 2021-06-11 ENCOUNTER — Other Ambulatory Visit: Payer: Self-pay | Admitting: Interventional Cardiology

## 2021-06-11 NOTE — Telephone Encounter (Signed)
Prescription refill request for Eliquis received. Indication: Afib Last office visit: 05/25/21 Eldridge Dace) Scr: 0.8 (04/16/20 via [place])  Age: 87 Weight: 51.6kg  Appropriate dose and refill sent to requested pharmacy.

## 2021-06-12 DIAGNOSIS — R269 Unspecified abnormalities of gait and mobility: Secondary | ICD-10-CM | POA: Diagnosis not present

## 2021-06-12 DIAGNOSIS — M62552 Muscle wasting and atrophy, not elsewhere classified, left thigh: Secondary | ICD-10-CM | POA: Diagnosis not present

## 2021-06-12 DIAGNOSIS — M25552 Pain in left hip: Secondary | ICD-10-CM | POA: Diagnosis not present

## 2021-06-12 DIAGNOSIS — M25652 Stiffness of left hip, not elsewhere classified: Secondary | ICD-10-CM | POA: Diagnosis not present

## 2021-06-16 DIAGNOSIS — R269 Unspecified abnormalities of gait and mobility: Secondary | ICD-10-CM | POA: Diagnosis not present

## 2021-06-16 DIAGNOSIS — M62552 Muscle wasting and atrophy, not elsewhere classified, left thigh: Secondary | ICD-10-CM | POA: Diagnosis not present

## 2021-06-16 DIAGNOSIS — M25552 Pain in left hip: Secondary | ICD-10-CM | POA: Diagnosis not present

## 2021-06-16 DIAGNOSIS — M25652 Stiffness of left hip, not elsewhere classified: Secondary | ICD-10-CM | POA: Diagnosis not present

## 2021-06-19 DIAGNOSIS — M25552 Pain in left hip: Secondary | ICD-10-CM | POA: Diagnosis not present

## 2021-06-19 DIAGNOSIS — R269 Unspecified abnormalities of gait and mobility: Secondary | ICD-10-CM | POA: Diagnosis not present

## 2021-06-19 DIAGNOSIS — M62552 Muscle wasting and atrophy, not elsewhere classified, left thigh: Secondary | ICD-10-CM | POA: Diagnosis not present

## 2021-06-19 DIAGNOSIS — M25652 Stiffness of left hip, not elsewhere classified: Secondary | ICD-10-CM | POA: Diagnosis not present

## 2021-06-23 DIAGNOSIS — I4891 Unspecified atrial fibrillation: Secondary | ICD-10-CM | POA: Diagnosis not present

## 2021-06-23 DIAGNOSIS — I2510 Atherosclerotic heart disease of native coronary artery without angina pectoris: Secondary | ICD-10-CM | POA: Diagnosis not present

## 2021-06-23 DIAGNOSIS — E039 Hypothyroidism, unspecified: Secondary | ICD-10-CM | POA: Diagnosis not present

## 2021-06-23 DIAGNOSIS — J449 Chronic obstructive pulmonary disease, unspecified: Secondary | ICD-10-CM | POA: Diagnosis not present

## 2021-06-23 DIAGNOSIS — G4700 Insomnia, unspecified: Secondary | ICD-10-CM | POA: Diagnosis not present

## 2021-06-23 DIAGNOSIS — E785 Hyperlipidemia, unspecified: Secondary | ICD-10-CM | POA: Diagnosis not present

## 2021-06-23 DIAGNOSIS — D6869 Other thrombophilia: Secondary | ICD-10-CM | POA: Diagnosis not present

## 2021-06-23 DIAGNOSIS — K219 Gastro-esophageal reflux disease without esophagitis: Secondary | ICD-10-CM | POA: Diagnosis not present

## 2021-06-23 DIAGNOSIS — I10 Essential (primary) hypertension: Secondary | ICD-10-CM | POA: Diagnosis not present

## 2021-06-24 DIAGNOSIS — R269 Unspecified abnormalities of gait and mobility: Secondary | ICD-10-CM | POA: Diagnosis not present

## 2021-06-24 DIAGNOSIS — M62552 Muscle wasting and atrophy, not elsewhere classified, left thigh: Secondary | ICD-10-CM | POA: Diagnosis not present

## 2021-06-24 DIAGNOSIS — M25652 Stiffness of left hip, not elsewhere classified: Secondary | ICD-10-CM | POA: Diagnosis not present

## 2021-06-24 DIAGNOSIS — M25552 Pain in left hip: Secondary | ICD-10-CM | POA: Diagnosis not present

## 2021-06-26 DIAGNOSIS — M62552 Muscle wasting and atrophy, not elsewhere classified, left thigh: Secondary | ICD-10-CM | POA: Diagnosis not present

## 2021-06-26 DIAGNOSIS — R269 Unspecified abnormalities of gait and mobility: Secondary | ICD-10-CM | POA: Diagnosis not present

## 2021-06-26 DIAGNOSIS — M25652 Stiffness of left hip, not elsewhere classified: Secondary | ICD-10-CM | POA: Diagnosis not present

## 2021-06-26 DIAGNOSIS — M25552 Pain in left hip: Secondary | ICD-10-CM | POA: Diagnosis not present

## 2021-07-01 DIAGNOSIS — M25552 Pain in left hip: Secondary | ICD-10-CM | POA: Diagnosis not present

## 2021-07-01 DIAGNOSIS — R269 Unspecified abnormalities of gait and mobility: Secondary | ICD-10-CM | POA: Diagnosis not present

## 2021-07-01 DIAGNOSIS — M25652 Stiffness of left hip, not elsewhere classified: Secondary | ICD-10-CM | POA: Diagnosis not present

## 2021-07-01 DIAGNOSIS — M62552 Muscle wasting and atrophy, not elsewhere classified, left thigh: Secondary | ICD-10-CM | POA: Diagnosis not present

## 2021-07-02 DIAGNOSIS — D509 Iron deficiency anemia, unspecified: Secondary | ICD-10-CM | POA: Insufficient documentation

## 2021-07-03 DIAGNOSIS — M62552 Muscle wasting and atrophy, not elsewhere classified, left thigh: Secondary | ICD-10-CM | POA: Diagnosis not present

## 2021-07-03 DIAGNOSIS — M25552 Pain in left hip: Secondary | ICD-10-CM | POA: Diagnosis not present

## 2021-07-03 DIAGNOSIS — R269 Unspecified abnormalities of gait and mobility: Secondary | ICD-10-CM | POA: Diagnosis not present

## 2021-07-03 DIAGNOSIS — M25652 Stiffness of left hip, not elsewhere classified: Secondary | ICD-10-CM | POA: Diagnosis not present

## 2021-07-07 ENCOUNTER — Ambulatory Visit (INDEPENDENT_AMBULATORY_CARE_PROVIDER_SITE_OTHER): Payer: Medicare PPO | Admitting: Internal Medicine

## 2021-07-07 ENCOUNTER — Encounter: Payer: Self-pay | Admitting: Internal Medicine

## 2021-07-07 VITALS — BP 102/68 | HR 68 | Ht 62.0 in | Wt 109.4 lb

## 2021-07-07 DIAGNOSIS — R5383 Other fatigue: Secondary | ICD-10-CM | POA: Diagnosis not present

## 2021-07-07 DIAGNOSIS — D509 Iron deficiency anemia, unspecified: Secondary | ICD-10-CM

## 2021-07-07 MED ORDER — SENNOSIDES 8.6 MG PO TABS: 1.0000 | ORAL_TABLET | Freq: Every day | ORAL | Status: AC

## 2021-07-07 MED ORDER — FERROUS SULFATE 325 (65 FE) MG PO TABS: 325.0000 mg | ORAL_TABLET | Freq: Every day | ORAL | Status: AC

## 2021-07-07 NOTE — Patient Instructions (Signed)
If you are age 85 or older, your body mass index should be between 23-30. Your Body mass index is 20.01 kg/m. If this is out of the aforementioned range listed, please consider follow up with your Primary Care Provider. __________________________________________________________  The [facility] providers would like to encourage you to use [facility] to communicate with providers for non-urgent requests or questions.  Due to long hold times on the telephone, sending your provider a message by [facility] may be a faster and more efficient way to get a response.  Please allow 48 business hours for a response.  Please remember that this is for non-urgent requests.   Please purchase the following medications over the counter and take as directed:  INCREASE: Iron from 3 times per week to daily with a source of vitamin c for better absorption.  START: Senna Lax daily.  You will follow up with our office on an as needed basis.  Thank you for entrusting me with your care and choosing [facility]  Dr Leonides Schanz

## 2021-07-07 NOTE — Progress Notes (Signed)
Chief Complaint: Iron deficiency anemia  HPI : 87 year old female with hx of A-fib on Eliquis, CAD, COPD, hypothyroidism presents with IDA. She has noticed that she has felt somewhat DOE, though she has previously attributed this to her COPD. Has also felt fatigued on occasion. Denies lightheadedness. Denies bleeding. Denies hematochezia. Her stools are darker and black but she attributes this to her iron supplement. Denies the stools being tarry and sticky. She takes her iron supplement three times a week. She has been doing PT with her hip and back. Denies smoking. Denies fam hx of GI cancers. Denies constipation, diarrhea, weight loss, dysphagia, vomiting, abdominal pain.   Past Medical History:  Diagnosis Date   Chronic atrial fibrillation (HCC)    a. Pt declined cardioversion.   Coronary artery disease    a. s/p CABGx2 06/2006 (LIMA-LAD, SVG-LCx at bifurcation of OM II/III). b. Nuc 11/2012: no evidence of ischemia, attenuation artifact c/w breast attenuation in anterior region, essentially low risk, EF 88%.   Essential hypertension    Hyperlipidemia    Hypothyroidism    Insomnia    on ambien, failed other meds   Iron deficiency anemia    Osteoarthritis    Osteopenia    involved on BMD 06/01/07 (T score-1.6); slightly worse in 3/13 (T score -1.6; FRAX 3%  and 12%)-plan rechcekin 2016   Uterine prolapse    post hysterectomy, Dr. Vincente Poli   UTI (lower urinary tract infection) 3/11     Past Surgical History:  Procedure Laterality Date   ABDOMINAL HYSTERECTOMY     APPENDECTOMY     CATARACT EXTRACTION, BILATERAL  11/02/2007   COLONOSCOPY WITH PROPOFOL N/A 11/30/2016   Procedure: COLONOSCOPY WITH PROPOFOL;  Surgeon: Charolett Bumpers, MD;  Location: [facility];  Service: Endoscopy;  Laterality: N/A;   CORONARY ARTERY BYPASS GRAFT  11/01/2005   ESOPHAGOGASTRODUODENOSCOPY N/A 11/30/2016   Procedure: ESOPHAGOGASTRODUODENOSCOPY (EGD);  Surgeon: Charolett Bumpers, MD;  Location: [facility];  Service: Endoscopy;  Laterality: N/A;   LIPOMA EXCISION     OVARIAN CYST REMOVAL     ROTATOR CUFF REPAIR     TEAR DUCT PROBING     X2 on left and x1 on right   TONSILLECTOMY     Family History  Problem Relation Age of Onset   Other Mother    Heart disease Mother    Syncope episode Mother    Ulcers Mother    Heart disease Father    Heart attack Father    CAD Father    Diabetes Father    Lymphoma Sister    Cancer Brother        throat   Brain cancer Brother    Throat cancer Brother    Diabetes Brother    Diabetes type II Son    Colon cancer Neg Hx    Esophageal cancer Neg Hx    Stomach cancer Neg Hx    Pancreatic cancer Neg Hx    Social History   Tobacco Use   Smoking status: Never    Passive exposure: Past   Smokeless tobacco: Never  Vaping Use   Vaping Use: Never used  Substance Use Topics   Alcohol use: No    Alcohol/week: 0.0 standard drinks   Drug use: No   Current Outpatient Medications  Medication Sig Dispense Refill   alendronate (FOSAMAX) 70 MG tablet Take 70 mg by mouth once a week.      atorvastatin (LIPITOR) 20 MG tablet TAKE 1 TABLET  BY MOUTH ONCE DAILY 90 tablet 0   Bioflavonoid Products (ESTER-C) TABS Take 1 tablet by mouth daily.     BIOTIN PO Take 1 tablet by mouth daily.     CALCIUM CARBONATE-VIT D-MIN PO Take by mouth daily.     ELIQUIS 2.5 MG TABS tablet TAKE 1 TABLET BY MOUTH TWICE A DAY 180 tablet 1   famotidine (PEPCID) 20 MG tablet famotidine 20 mg tablet  Take 1 tablet twice a day by oral route.     furosemide (LASIX) 20 MG tablet TAKE 1 TABLET EACH DAY. 90 tablet 3   Hypromellose (ARTIFICIAL TEARS OP) Place 1 drop into both eyes every 6 (six) hours as needed (dry eyes).     levothyroxine (SYNTHROID, LEVOTHROID) 75 MCG tablet Take 37.5-75 mcg by mouth daily before breakfast. 1 tablet 6 days a week and 1/2 tab every Friday.     metoprolol succinate (TOPROL-XL) 50 MG 24 hr tablet Take 1 tablet (50 mg total) by mouth daily. 90  tablet 3   Misc Natural Products (OSTEO BI-FLEX ADV JOINT SHIELD PO) Take 1 tablet by mouth 2 (two) times daily.      nitroGLYCERIN (NITROSTAT) 0.4 MG SL tablet Place 1 tablet (0.4 mg total) under the tongue every 5 (five) minutes as needed for chest pain. 25 tablet 0   Omega-3 Fatty Acids (FISH OIL) 1200 MG CAPS Take 1,200 mg by mouth 2 (two) times daily.     senna (SENNA LAX) 8.6 MG tablet Take 1 tablet (8.6 mg total) by mouth daily.     SPIRIVA RESPIMAT 2.5 MCG/ACT AERS INHALE 2 PUFFS BY MOUTH INTO THE LUNGS DAILY 4 g 5   vitamin B-12 (CYANOCOBALAMIN) 1000 MCG tablet Take 1,000 mcg by mouth daily.     ferrous sulfate 325 (65 FE) MG tablet Take 1 tablet (325 mg total) by mouth daily.     No current facility-administered medications for this visit.   Allergies  Allergen Reactions   Sulfa Antibiotics Rash     Review of Systems: All systems reviewed and negative except where noted in HPI.   Physical Exam: BP 102/68   Pulse 68   Ht 5\' 2"  (1.575 m)   Wt 109 lb 6.4 oz (49.6 kg)   SpO2 97%   BMI 20.01 kg/m  Constitutional: Pleasant,well-developed, female in no acute distress. HEENT: Normocephalic and atraumatic. Conjunctivae are normal. No scleral icterus. Cardiovascular: Normal rate, regular rhythm.  Pulmonary/chest: Effort normal and breath sounds normal. No wheezing, rales or rhonchi. Abdominal: Soft, nondistended, nontender. Extremities: no edema Neurological: Alert and oriented to person place and time. Psychiatric: Normal mood and affect. Behavior is normal.  No results found.  Labs 04/29/21: Hb 11, Ferritin 155, Iron sat 14% (L)  Labs 10/10/20 - CBC with Hb of 11.3, plt of 118. Iron studies normal.  EGD 11/30/16: - Z-line regular, 37 cm from the incisors. - Normal esophagus. - Normal stomach. - Normal examined duodenum. - No specimens collected.  Colonoscopy 11/30/16: - One 5 mm polyp in the mid sigmoid colon, removed with a cold snare. Resected and retrieved. - The  examination was otherwise normal. Path: Hyperplastic polyp  ASSESSMENT AND PLAN: IDA Fatigue Hb has remained stable over time, which is reassuring. Last set of labs showed a slightly low iron saturation but ferritin was still normal at 155. At this time I told the patient to increase her daily iron to once daily. EGD and colonoscopy would likely be of low benefit at this time (last  EGD/colon in 2018 only showed one small HP, and she was already anemic at that time) and potentially higher risk due to her age, and patient was in agreement. If patient were to have worsening anemia in the future or develop new symptoms in the future, would be happy to see her at that time. - Increase iron from three times weekly to QD. If this does not help, can back down to three times weekly again. Counseled to take iron with Vitamin C and to take daily senna to help with constipation - Can follow with PCP to continue to trend blood counts and iron levels - RTC PRN  Eulah Pont, MD

## 2021-07-13 DIAGNOSIS — I83892 Varicose veins of left lower extremities with other complications: Secondary | ICD-10-CM | POA: Diagnosis not present

## 2021-07-13 DIAGNOSIS — Z23 Encounter for immunization: Secondary | ICD-10-CM | POA: Diagnosis not present

## 2021-07-13 DIAGNOSIS — I482 Chronic atrial fibrillation, unspecified: Secondary | ICD-10-CM | POA: Diagnosis not present

## 2021-07-13 DIAGNOSIS — D6869 Other thrombophilia: Secondary | ICD-10-CM | POA: Diagnosis not present

## 2021-07-13 DIAGNOSIS — D696 Thrombocytopenia, unspecified: Secondary | ICD-10-CM | POA: Diagnosis not present

## 2021-07-13 DIAGNOSIS — D509 Iron deficiency anemia, unspecified: Secondary | ICD-10-CM | POA: Diagnosis not present

## 2021-08-08 ENCOUNTER — Other Ambulatory Visit: Payer: Self-pay | Admitting: Pulmonary Disease

## 2021-08-08 DIAGNOSIS — J449 Chronic obstructive pulmonary disease, unspecified: Secondary | ICD-10-CM

## 2021-09-26 ENCOUNTER — Other Ambulatory Visit: Payer: Self-pay | Admitting: Interventional Cardiology

## 2021-09-26 DIAGNOSIS — I10 Essential (primary) hypertension: Secondary | ICD-10-CM

## 2021-10-16 DIAGNOSIS — M859 Disorder of bone density and structure, unspecified: Secondary | ICD-10-CM | POA: Diagnosis not present

## 2021-10-16 DIAGNOSIS — E785 Hyperlipidemia, unspecified: Secondary | ICD-10-CM | POA: Diagnosis not present

## 2021-10-16 DIAGNOSIS — M810 Age-related osteoporosis without current pathological fracture: Secondary | ICD-10-CM | POA: Diagnosis not present

## 2021-10-16 DIAGNOSIS — I10 Essential (primary) hypertension: Secondary | ICD-10-CM | POA: Diagnosis not present

## 2021-10-16 DIAGNOSIS — E039 Hypothyroidism, unspecified: Secondary | ICD-10-CM | POA: Diagnosis not present

## 2021-11-04 DIAGNOSIS — M545 Low back pain, unspecified: Secondary | ICD-10-CM | POA: Diagnosis not present

## 2021-11-04 DIAGNOSIS — H16103 Unspecified superficial keratitis, bilateral: Secondary | ICD-10-CM | POA: Diagnosis not present

## 2021-11-04 DIAGNOSIS — H04223 Epiphora due to insufficient drainage, bilateral lacrimal glands: Secondary | ICD-10-CM | POA: Diagnosis not present

## 2021-11-20 DIAGNOSIS — E039 Hypothyroidism, unspecified: Secondary | ICD-10-CM | POA: Diagnosis not present

## 2021-11-20 DIAGNOSIS — J449 Chronic obstructive pulmonary disease, unspecified: Secondary | ICD-10-CM | POA: Diagnosis not present

## 2021-11-20 DIAGNOSIS — Z0000 Encounter for general adult medical examination without abnormal findings: Secondary | ICD-10-CM | POA: Diagnosis not present

## 2021-11-20 DIAGNOSIS — M810 Age-related osteoporosis without current pathological fracture: Secondary | ICD-10-CM | POA: Diagnosis not present

## 2021-11-20 DIAGNOSIS — I482 Chronic atrial fibrillation, unspecified: Secondary | ICD-10-CM | POA: Diagnosis not present

## 2021-11-20 DIAGNOSIS — D6869 Other thrombophilia: Secondary | ICD-10-CM | POA: Diagnosis not present

## 2021-11-20 DIAGNOSIS — D509 Iron deficiency anemia, unspecified: Secondary | ICD-10-CM | POA: Diagnosis not present

## 2021-11-20 DIAGNOSIS — D696 Thrombocytopenia, unspecified: Secondary | ICD-10-CM | POA: Diagnosis not present

## 2021-11-20 DIAGNOSIS — I10 Essential (primary) hypertension: Secondary | ICD-10-CM | POA: Diagnosis not present

## 2021-11-20 DIAGNOSIS — I25118 Atherosclerotic heart disease of native coronary artery with other forms of angina pectoris: Secondary | ICD-10-CM | POA: Diagnosis not present

## 2021-11-26 ENCOUNTER — Other Ambulatory Visit: Payer: Self-pay | Admitting: Interventional Cardiology

## 2021-11-26 NOTE — Telephone Encounter (Deleted)
Prescription refill request for Eliquis received. Indication:Afib  Last office visit:05/25/21 [name])  Scr:0.8 (04/16/20)  Age: 88 Weight: 49.6kg  Labs overdue. Called PCP, left message.

## 2021-11-27 NOTE — Telephone Encounter (Signed)
Prescription refill request for Eliquis received. Indication:Afib  Last office visit:05/25/21 Eldridge Dace)  Scr: 0.8 (10/16/21 (via fax from PCP office)  Age: 88 Weight: 49.6kg  Appropriate dose and refill sent to requested pharmacy.

## 2021-12-11 DIAGNOSIS — H0014 Chalazion left upper eyelid: Secondary | ICD-10-CM | POA: Diagnosis not present

## 2021-12-11 DIAGNOSIS — H04223 Epiphora due to insufficient drainage, bilateral lacrimal glands: Secondary | ICD-10-CM | POA: Diagnosis not present

## 2021-12-11 DIAGNOSIS — H02102 Unspecified ectropion of right lower eyelid: Secondary | ICD-10-CM | POA: Diagnosis not present

## 2022-01-19 DIAGNOSIS — I25118 Atherosclerotic heart disease of native coronary artery with other forms of angina pectoris: Secondary | ICD-10-CM | POA: Diagnosis not present

## 2022-01-19 DIAGNOSIS — D509 Iron deficiency anemia, unspecified: Secondary | ICD-10-CM | POA: Diagnosis not present

## 2022-01-19 DIAGNOSIS — D6869 Other thrombophilia: Secondary | ICD-10-CM | POA: Diagnosis not present

## 2022-01-19 DIAGNOSIS — J449 Chronic obstructive pulmonary disease, unspecified: Secondary | ICD-10-CM | POA: Diagnosis not present

## 2022-01-19 DIAGNOSIS — I5033 Acute on chronic diastolic (congestive) heart failure: Secondary | ICD-10-CM | POA: Diagnosis not present

## 2022-01-19 DIAGNOSIS — R0600 Dyspnea, unspecified: Secondary | ICD-10-CM | POA: Diagnosis not present

## 2022-01-19 DIAGNOSIS — M810 Age-related osteoporosis without current pathological fracture: Secondary | ICD-10-CM | POA: Diagnosis not present

## 2022-01-19 DIAGNOSIS — I482 Chronic atrial fibrillation, unspecified: Secondary | ICD-10-CM | POA: Diagnosis not present

## 2022-01-19 DIAGNOSIS — D692 Other nonthrombocytopenic purpura: Secondary | ICD-10-CM | POA: Diagnosis not present

## 2022-02-02 ENCOUNTER — Ambulatory Visit (INDEPENDENT_AMBULATORY_CARE_PROVIDER_SITE_OTHER): Payer: Medicare PPO

## 2022-02-02 ENCOUNTER — Encounter: Payer: Self-pay | Admitting: Pulmonary Disease

## 2022-02-02 ENCOUNTER — Other Ambulatory Visit: Payer: Self-pay | Admitting: *Deleted

## 2022-02-02 ENCOUNTER — Ambulatory Visit: Payer: Medicare PPO | Admitting: Pulmonary Disease

## 2022-02-02 VITALS — BP 118/60 | HR 74 | Temp 97.7°F | Ht 62.5 in | Wt 108.0 lb

## 2022-02-02 DIAGNOSIS — Z951 Presence of aortocoronary bypass graft: Secondary | ICD-10-CM | POA: Diagnosis not present

## 2022-02-02 DIAGNOSIS — J449 Chronic obstructive pulmonary disease, unspecified: Secondary | ICD-10-CM | POA: Diagnosis not present

## 2022-02-02 DIAGNOSIS — J439 Emphysema, unspecified: Secondary | ICD-10-CM | POA: Diagnosis not present

## 2022-02-02 DIAGNOSIS — R0602 Shortness of breath: Secondary | ICD-10-CM

## 2022-02-02 DIAGNOSIS — R918 Other nonspecific abnormal finding of lung field: Secondary | ICD-10-CM | POA: Diagnosis not present

## 2022-02-02 MED ORDER — AZITHROMYCIN 250 MG PO TABS: ORAL_TABLET | ORAL | 0 refills | Status: DC

## 2022-02-02 MED ORDER — TIOTROPIUM BROMIDE-OLODATEROL 2.5-2.5 MCG/ACT IN AERS: 2.0000 | INHALATION_SPRAY | Freq: Every day | RESPIRATORY_TRACT | 5 refills | Status: DC

## 2022-02-02 NOTE — Patient Instructions (Signed)
We will stop the Spiriva and start you on stiolto inhaler ?Get chest x-ray today ?Follow-up in 6 months. ?

## 2022-02-02 NOTE — Progress Notes (Signed)
? ?      ?[name]    [id number]    Nov 01, 1932 ? ?Primary Care Physician:Skakle, Eliberto Ivory, DO ? ?Referring Physician: Charlane Ferretti, DO ?[address] ?[place],  [place] [address] ? ?Chief complaint: Follow-up for COPD ? ?HPI: ?88 year old with COPD.  ?He is on Spiriva.  She states that she is doing well with this regimen.  Complains of mild dyspnea on exertion.  No cough, sputum production, fevers, chills. ? ?Pets: Has a dog and a cat.  No birds, farm animals ?Occupation: Retired Chartered loss adjuster. ?Exposures: No known exposures, no mold, hot tub, Jacuzzi ?Smoking history: Never smoker ?Travel history: No significant travel history ?Relevant family history: No significant family history of lung disease ? ?Interim history: ?Complains of worsening dyspnea on exertion over the past year.  She is intermittently on Lasix per Dr. Thornell Mule, primary care.  Continues on Spiriva. ?Denies any cough, sputum production, wheezing. ? ?Outpatient Encounter Medications as of 02/02/2022  ?Medication Sig  ? alendronate (FOSAMAX) 70 MG tablet Take 70 mg by mouth once a week.   ? apixaban (ELIQUIS) 2.5 MG TABS tablet TAKE 1 TABLET BY MOUTH TWICE A DAY  ? atorvastatin (LIPITOR) 20 MG tablet TAKE 1 TABLET BY MOUTH EVERY DAY  ? Bioflavonoid Products (ESTER-C) TABS Take 1 tablet by mouth daily.  ? BIOTIN PO Take 1 tablet by mouth daily.  ? CALCIUM CARBONATE-VIT D-MIN PO Take by mouth daily.  ? famotidine (PEPCID) 20 MG tablet famotidine 20 mg tablet ? Take 1 tablet twice a day by oral route.  ? ferrous sulfate 325 (65 FE) MG tablet Take 1 tablet (325 mg total) by mouth daily.  ? furosemide (LASIX) 20 MG tablet TAKE 1 TABLET EACH DAY. (Patient taking differently: Taking 1 tablet  for 2 days then skip a day.)  ? Hypromellose (ARTIFICIAL TEARS OP) Place 1 drop into both eyes every 6 (six) hours as needed (dry eyes).  ? levothyroxine (SYNTHROID, LEVOTHROID) 75 MCG tablet Take 37.5-75 mcg by mouth daily before breakfast. 1 tablet 6 days a week and  1/2 tab every Friday.  ? metoprolol succinate (TOPROL-XL) 50 MG 24 hr tablet Take 1 tablet (50 mg total) by mouth daily.  ? Misc Natural Products (OSTEO BI-FLEX ADV JOINT SHIELD PO) Take 1 tablet by mouth 2 (two) times daily.   ? nitroGLYCERIN (NITROSTAT) 0.4 MG SL tablet Place 1 tablet (0.4 mg total) under the tongue every 5 (five) minutes as needed for chest pain.  ? Omega-3 Fatty Acids (FISH OIL) 1200 MG CAPS Take 1,200 mg by mouth 2 (two) times daily.  ? senna (SENNA LAX) 8.6 MG tablet Take 1 tablet (8.6 mg total) by mouth daily.  ? SPIRIVA RESPIMAT 2.5 MCG/ACT AERS INHALE 2 PUFFS BY MOUTH INTO THE LUNGS DAILY  ? vitamin B-12 (CYANOCOBALAMIN) 1000 MCG tablet Take 1,000 mcg by mouth daily.  ? ?No facility-administered encounter medications on file as of 02/02/2022.  ? ?Physical Exam: ?Blood pressure 118/60, pulse 74, temperature 97.7 ?F (36.5 ?C), temperature source Oral, height 5' 2.5" (1.588 m), weight 108 lb (49 kg), SpO2 98 %. ?Gen:      No acute distress ?HEENT:  EOMI, sclera anicteric ?Neck:     No masses; no thyromegaly ?Lungs:    Clear to auscultation bilaterally; normal respiratory effort ?CV:         Regular rate and rhythm; no murmurs ?Abd:      + bowel sounds; soft, non-tender; no palpable masses, no distension ?Ext:  No edema; adequate peripheral perfusion ?Skin:      Warm and dry; no rash ?Neuro: alert and oriented x 3 ?Psych: normal mood and affect  ? ?Data Reviewed: ?Imaging: ?CT chest 10/31/2015 mild bibasilar atelectasis, scarring. ?Chest x-ray 02/25/2021-hyperinflation consistent with COPD. ?I have reviewed the images personally. ? ?PFTs: ?12/17/2015 ?FVC 2.48 [120%], FEV1 1.09 [72%], F/F 44, TLC 103%, DLCO 68% ?Moderate obstruction.  Disproportionately blunted expiratory flow loop ?Minimal diffusion defect ? ?Labs: ?CBC 08/20/2016-WBC 5.4, eos 3%, absolute eosinophil count 162 ?CBC 11/22/2018-WBC 4.5, eos 3.9%, absolute eosinophil count 175 ?IgE 11/22/2018-7,  ? ?Alpha-1 antitrypsin 11/22/2018-144,  [facility] ?Alpha-1 antitrypsin 02/25/2021- 171 ? ?Cardiac: ?Echocardiogram 11/25/2015- LVEF 50-55%, PA peak pressure 45 mm. ?Nuclear stress test 11/25/2015- normal study with no ischemia. ? ?Assessment:  ?Moderate COPD ?She is a never smoker.  Has a diagnosis of COPD on record ?Not sure if the obstruction on the PFTs is accurate as there is a abnormal, disproportionately blunted expiratory flow loop suggestive of poor effort.  I have suggested to her that we can repeat the PFTs or spirometry but she does not want to go to the procedure again ? ?Has gradually worsening dyspnea on exertion.  We will check a chest x-ray today and change Spiriva to stiolto ?Continues to stay active and is doing breathing exercises at home ? ?Alpha-1 antitrypsin heterozygote ?Has normal levels with PI MS phenotype.  We will continue monitoring. ? ?Plan/Recommendations: ?- Change Spiriva to stiolto ? ?Chilton Greathouse MD ?[facility] ?02/02/2022, 10:43 AM ? ?CC: Charlane Ferretti, DO ? ? ?

## 2022-03-02 ENCOUNTER — Ambulatory Visit (INDEPENDENT_AMBULATORY_CARE_PROVIDER_SITE_OTHER): Payer: Medicare PPO

## 2022-03-02 DIAGNOSIS — I517 Cardiomegaly: Secondary | ICD-10-CM | POA: Diagnosis not present

## 2022-03-02 DIAGNOSIS — H02102 Unspecified ectropion of right lower eyelid: Secondary | ICD-10-CM | POA: Diagnosis not present

## 2022-03-02 DIAGNOSIS — R0602 Shortness of breath: Secondary | ICD-10-CM

## 2022-03-02 DIAGNOSIS — J449 Chronic obstructive pulmonary disease, unspecified: Secondary | ICD-10-CM | POA: Diagnosis not present

## 2022-03-02 DIAGNOSIS — H524 Presbyopia: Secondary | ICD-10-CM | POA: Diagnosis not present

## 2022-03-02 DIAGNOSIS — Z8701 Personal history of pneumonia (recurrent): Secondary | ICD-10-CM | POA: Diagnosis not present

## 2022-03-02 DIAGNOSIS — H26491 Other secondary cataract, right eye: Secondary | ICD-10-CM | POA: Diagnosis not present

## 2022-03-02 DIAGNOSIS — H04223 Epiphora due to insufficient drainage, bilateral lacrimal glands: Secondary | ICD-10-CM | POA: Diagnosis not present

## 2022-03-02 DIAGNOSIS — R918 Other nonspecific abnormal finding of lung field: Secondary | ICD-10-CM | POA: Diagnosis not present

## 2022-03-02 DIAGNOSIS — M47814 Spondylosis without myelopathy or radiculopathy, thoracic region: Secondary | ICD-10-CM | POA: Diagnosis not present

## 2022-03-03 ENCOUNTER — Other Ambulatory Visit: Payer: Self-pay | Admitting: *Deleted

## 2022-03-03 DIAGNOSIS — R0602 Shortness of breath: Secondary | ICD-10-CM

## 2022-03-10 ENCOUNTER — Ambulatory Visit
Admission: RE | Admit: 2022-03-10 | Discharge: 2022-03-10 | Disposition: A | Discharge status: Home / Self Care | Payer: Medicare PPO | Source: Ambulatory Visit | Attending: Pulmonary Disease | Admitting: Pulmonary Disease

## 2022-03-10 DIAGNOSIS — R0602 Shortness of breath: Secondary | ICD-10-CM

## 2022-03-10 DIAGNOSIS — J90 Pleural effusion, not elsewhere classified: Secondary | ICD-10-CM | POA: Diagnosis not present

## 2022-03-10 DIAGNOSIS — J9811 Atelectasis: Secondary | ICD-10-CM | POA: Diagnosis not present

## 2022-04-05 DIAGNOSIS — Z7901 Long term (current) use of anticoagulants: Secondary | ICD-10-CM | POA: Diagnosis not present

## 2022-04-05 DIAGNOSIS — M7989 Other specified soft tissue disorders: Secondary | ICD-10-CM | POA: Diagnosis not present

## 2022-04-05 DIAGNOSIS — S99921A Unspecified injury of right foot, initial encounter: Secondary | ICD-10-CM | POA: Diagnosis not present

## 2022-04-20 DIAGNOSIS — S93401A Sprain of unspecified ligament of right ankle, initial encounter: Secondary | ICD-10-CM | POA: Diagnosis not present

## 2022-04-20 DIAGNOSIS — M79671 Pain in right foot: Secondary | ICD-10-CM | POA: Diagnosis not present

## 2022-05-11 DIAGNOSIS — J449 Chronic obstructive pulmonary disease, unspecified: Secondary | ICD-10-CM | POA: Diagnosis not present

## 2022-05-11 DIAGNOSIS — I482 Chronic atrial fibrillation, unspecified: Secondary | ICD-10-CM | POA: Diagnosis not present

## 2022-05-11 DIAGNOSIS — M810 Age-related osteoporosis without current pathological fracture: Secondary | ICD-10-CM | POA: Diagnosis not present

## 2022-05-11 DIAGNOSIS — D509 Iron deficiency anemia, unspecified: Secondary | ICD-10-CM | POA: Diagnosis not present

## 2022-05-11 DIAGNOSIS — I5032 Chronic diastolic (congestive) heart failure: Secondary | ICD-10-CM | POA: Diagnosis not present

## 2022-05-11 DIAGNOSIS — D692 Other nonthrombocytopenic purpura: Secondary | ICD-10-CM | POA: Diagnosis not present

## 2022-05-11 DIAGNOSIS — I25118 Atherosclerotic heart disease of native coronary artery with other forms of angina pectoris: Secondary | ICD-10-CM | POA: Diagnosis not present

## 2022-05-11 DIAGNOSIS — I10 Essential (primary) hypertension: Secondary | ICD-10-CM | POA: Diagnosis not present

## 2022-05-11 DIAGNOSIS — D696 Thrombocytopenia, unspecified: Secondary | ICD-10-CM | POA: Diagnosis not present

## 2022-05-14 ENCOUNTER — Other Ambulatory Visit (HOSPITAL_COMMUNITY): Payer: Self-pay | Admitting: Internal Medicine

## 2022-05-14 DIAGNOSIS — I5032 Chronic diastolic (congestive) heart failure: Secondary | ICD-10-CM

## 2022-05-18 DIAGNOSIS — I25119 Atherosclerotic heart disease of native coronary artery with unspecified angina pectoris: Secondary | ICD-10-CM | POA: Diagnosis not present

## 2022-05-18 DIAGNOSIS — M810 Age-related osteoporosis without current pathological fracture: Secondary | ICD-10-CM | POA: Diagnosis not present

## 2022-05-18 DIAGNOSIS — J439 Emphysema, unspecified: Secondary | ICD-10-CM | POA: Diagnosis not present

## 2022-05-18 DIAGNOSIS — E785 Hyperlipidemia, unspecified: Secondary | ICD-10-CM | POA: Diagnosis not present

## 2022-05-18 DIAGNOSIS — D6869 Other thrombophilia: Secondary | ICD-10-CM | POA: Diagnosis not present

## 2022-05-18 DIAGNOSIS — I110 Hypertensive heart disease with heart failure: Secondary | ICD-10-CM | POA: Diagnosis not present

## 2022-05-18 DIAGNOSIS — I509 Heart failure, unspecified: Secondary | ICD-10-CM | POA: Diagnosis not present

## 2022-05-18 DIAGNOSIS — E039 Hypothyroidism, unspecified: Secondary | ICD-10-CM | POA: Diagnosis not present

## 2022-05-18 DIAGNOSIS — I4891 Unspecified atrial fibrillation: Secondary | ICD-10-CM | POA: Diagnosis not present

## 2022-05-31 ENCOUNTER — Ambulatory Visit: Payer: Medicare PPO | Admitting: Interventional Cardiology

## 2022-06-01 ENCOUNTER — Ambulatory Visit (HOSPITAL_COMMUNITY): Payer: Medicare PPO | Attending: Internal Medicine

## 2022-06-01 DIAGNOSIS — I5032 Chronic diastolic (congestive) heart failure: Secondary | ICD-10-CM | POA: Diagnosis not present

## 2022-06-01 LAB — ECHOCARDIOGRAM COMPLETE
Area-P 1/2: 3.88 cm2
S' Lateral: 3 cm

## 2022-06-09 DIAGNOSIS — I272 Pulmonary hypertension, unspecified: Secondary | ICD-10-CM | POA: Diagnosis not present

## 2022-06-09 DIAGNOSIS — I739 Peripheral vascular disease, unspecified: Secondary | ICD-10-CM | POA: Diagnosis not present

## 2022-06-11 ENCOUNTER — Other Ambulatory Visit: Payer: Self-pay | Admitting: Pulmonary Disease

## 2022-06-18 ENCOUNTER — Other Ambulatory Visit: Payer: Self-pay | Admitting: Interventional Cardiology

## 2022-06-18 DIAGNOSIS — I10 Essential (primary) hypertension: Secondary | ICD-10-CM

## 2022-06-21 NOTE — Progress Notes (Unsigned)
Cardiology Office Note   Date:  06/22/2022   ID:  [name], DOB 07-25-1932, MRN [id number]  PCP:  Charlane Ferretti, DO    No chief complaint on file.  CAD  Wt Readings from Last 3 Encounters:  06/22/22 112 lb (50.8 kg)  02/02/22 108 lb (49 kg)  07/07/21 109 lb 6.4 oz (49.6 kg)       History of Present Illness: [name] is a 88 y.o. female   who had CABG in 2007 for left main disease, by Dr. Dorris Fetch. She had hysterectomy in May 2012 and did well.     Her husband passed away many years ago.   She has had some hip pain which has limited her walking and dancing.  Very little relief from the shot she had in her hip.   In Jan 2018, she had a colonoscopy that was ok.     She has bowled in the past and had some DOE with square dancing.  Sx were mild and were managed conservatively.  She has had some atypical chest pains over the years.    She got a puppy to help encourage activity.  She has stopped sleeping pills as well.   She had DOE: Echo in 8/23 showed: "Left ventricular ejection fraction, by estimation, is 55 to 60%. The  left ventricle has normal function. The left ventricle has no regional  wall motion abnormalities. Left ventricular diastolic parameters are  indeterminate.   2. Right ventricular systolic function is normal. The right ventricular  size is moderately enlarged. There is moderately elevated pulmonary artery  systolic pressure.   3. Left atrial size was severely dilated.   4. Right atrial size was severely dilated.   5. The mitral valve is normal in structure. Mild mitral valve  regurgitation. No evidence of mitral stenosis. Moderate mitral annular  calcification.   6. Tricuspid valve regurgitation is severe.   7. The aortic valve is normal in structure. Aortic valve regurgitation is  not visualized. No aortic stenosis is present.   8. Pulmonic valve regurgitation is moderate.   9. The inferior vena cava is normal in size with greater  than 50%  respiratory variability, suggesting right atrial pressure of 3 mmHg. "  She still bowls.  No longer square dancing.   Walks in the house.   She has fallen in the past when walking in the yard.  Takes tylenol for her hip pain.   Denies : Chest pain. Dizziness. Leg edema. Nitroglycerin use. Orthopnea. Palpitations. Paroxysmal nocturnal dyspnea.  Syncope.    Past Medical History:  Diagnosis Date   Chronic atrial fibrillation (HCC)    a. Pt declined cardioversion.   Coronary artery disease    a. s/p CABGx2 06/2006 (LIMA-LAD, SVG-LCx at bifurcation of OM II/III). b. Nuc 11/2012: no evidence of ischemia, attenuation artifact c/w breast attenuation in anterior region, essentially low risk, EF 88%.   Essential hypertension    Hyperlipidemia    Hypothyroidism    Insomnia    on ambien, failed other meds   Iron deficiency anemia    Osteoarthritis    Osteopenia    involved on BMD 06/01/07 (T score-1.6); slightly worse in 3/13 (T score -1.6; FRAX 3%  and 12%)-plan rechcekin 2016   Uterine prolapse    post hysterectomy, Dr. Vincente Poli   UTI (lower urinary tract infection) 3/11    Past Surgical History:  Procedure Laterality Date   ABDOMINAL HYSTERECTOMY     APPENDECTOMY  CATARACT EXTRACTION, BILATERAL  11/02/2007   COLONOSCOPY WITH PROPOFOL N/A 11/30/2016   Procedure: COLONOSCOPY WITH PROPOFOL;  Surgeon: Charolett Bumpers, MD;  Location: [facility];  Service: Endoscopy;  Laterality: N/A;   CORONARY ARTERY BYPASS GRAFT  11/01/2005   ESOPHAGOGASTRODUODENOSCOPY N/A 11/30/2016   Procedure: ESOPHAGOGASTRODUODENOSCOPY (EGD);  Surgeon: Charolett Bumpers, MD;  Location: [facility];  Service: Endoscopy;  Laterality: N/A;   LIPOMA EXCISION     OVARIAN CYST REMOVAL     ROTATOR CUFF REPAIR     TEAR DUCT PROBING     X2 on left and x1 on right   TONSILLECTOMY       Current Outpatient Medications  Medication Sig Dispense Refill   alendronate (FOSAMAX) 70 MG tablet Take 70 mg by mouth  once a week.      apixaban (ELIQUIS) 2.5 MG TABS tablet TAKE 1 TABLET BY MOUTH TWICE A DAY 180 tablet 2   atorvastatin (LIPITOR) 20 MG tablet TAKE 1 TABLET BY MOUTH EVERY DAY 90 tablet 2   azithromycin (ZITHROMAX) 250 MG tablet Take as directed 6 tablet 0   Bioflavonoid Products (ESTER-C) TABS Take 1 tablet by mouth daily.     BIOTIN PO Take 1 tablet by mouth daily.     CALCIUM CARBONATE-VIT D-MIN PO Take by mouth daily.     famotidine (PEPCID) 20 MG tablet famotidine 20 mg tablet  Take 1 tablet twice a day by oral route.     ferrous sulfate 325 (65 FE) MG tablet Take 1 tablet (325 mg total) by mouth daily. (Patient taking differently: Take 325 mg by mouth every other day.)     furosemide (LASIX) 20 MG tablet TAKE 1 TABLET EACH DAY. (Patient taking differently: Taking 1 tablet  for 2 days then skip a day.) 90 tablet 3   Hypromellose (ARTIFICIAL TEARS OP) Place 1 drop into both eyes every 6 (six) hours as needed (dry eyes).     levothyroxine (SYNTHROID, LEVOTHROID) 75 MCG tablet Take 37.5-75 mcg by mouth daily before breakfast. 1 tablet 6 days a week and 1/2 tab every Friday.     metoprolol succinate (TOPROL-XL) 50 MG 24 hr tablet Take 1 tablet (50 mg total) by mouth daily. 90 tablet 3   Misc Natural Products (OSTEO BI-FLEX ADV JOINT SHIELD PO) Take 1 tablet by mouth 2 (two) times daily.      nitroGLYCERIN (NITROSTAT) 0.4 MG SL tablet Place 1 tablet (0.4 mg total) under the tongue every 5 (five) minutes as needed for chest pain. 25 tablet 0   Omega-3 Fatty Acids (FISH OIL) 1200 MG CAPS Take 1,200 mg by mouth 2 (two) times daily.     senna (SENNA LAX) 8.6 MG tablet Take 1 tablet (8.6 mg total) by mouth daily.     Tiotropium Bromide-Olodaterol (STIOLTO RESPIMAT) 2.5-2.5 MCG/ACT AERS INHALE 2 PUFFS BY MOUTH INTO THE LUNGS DAILY 4 g 5   vitamin B-12 (CYANOCOBALAMIN) 1000 MCG tablet Take 1,000 mcg by mouth daily.     No current facility-administered medications for this visit.    Allergies:   Sulfa  antibiotics    Social History:  The patient  reports that she has never smoked. She has been exposed to tobacco smoke. She has never used smokeless tobacco. She reports that she does not drink alcohol and does not use drugs.   Family History:  The patient's family history includes Brain cancer in her brother; CAD in her father; Cancer in her brother; Diabetes in her brother and father;  Diabetes type II in her son; Heart attack in her father; Heart disease in her father and mother; Lymphoma in her sister; Other in her mother; Syncope episode in her mother; Throat cancer in her brother; Ulcers in her mother.    ROS:  Please see the history of present illness.   Otherwise, review of systems are positive for easy bruising, right ankle swelling after hurting her ankle while playing with her dog.   All other systems are reviewed and negative.    PHYSICAL EXAM: VS:  BP 130/70 (BP Location: Left Arm, Patient Position: Sitting, Cuff Size: Normal)   Pulse 69   Ht 5' 2.5" (1.588 m)   Wt 112 lb (50.8 kg)   SpO2 96%   BMI 20.16 kg/m  , BMI Body mass index is 20.16 kg/m. GEN: Frail, in no acute distress HEENT: normal Neck: no JVD, carotid bruits, or masses Cardiac: RRR; no murmurs, rubs, or gallops,no edema  Respiratory:  clear to auscultation bilaterally, normal work of breathing GI: soft, nontender, nondistended, + BS MS: no deformity or atrophy Skin: warm and dry, no rash Neuro:  Strength and sensation are intact Psych: euthymic mood, full affect   EKG:   The ekg ordered today demonstrates AFib, rate controlled   Recent Labs: No results found for requested labs within last 365 days.   Lipid Panel No results found for: "CHOL", "TRIG", "HDL", "CHOLHDL", "VLDL", "LDLCALC", "LDLDIRECT"   Other studies Reviewed: Additional studies/ records that were reviewed today with results demonstrating: labs in December 2022 total cholesterol 107 HDL 49 LDL 48 triglycerides 50.     ASSESSMENT AND  PLAN:  CAD: She has had chronic DOE.  Normal LVEF.  No angina.  I suspect she has some deconditioning related to her low activity level.  She had an ankle injury that limited her for a few months.  Chronic back pain and knee pain.  AFib: Rate controlled.   Anticoagulation: acquired thrombophilia. No bleeding issues, just easy bruising Hyperlipidemia: labs reviewed. Continue atorvastatin.  Healthy diet recommended.  She does not eat much meat.  She does enjoy fruits and vegetables. Avoid falls.  Walking is good, but do it safely.     Current medicines are reviewed at length with the patient today.  The patient concerns regarding her medicines were addressed.  The following changes have been made:  No change  Labs/ tests ordered today include:  No orders of the defined types were placed in this encounter.   Recommend 150 minutes/week of aerobic exercise Low fat, low carb, high fiber diet recommended  Disposition:   FU in 1 year   Signed, Lance Muss, MD  06/22/2022 9:35 AM    [facility] [address] [place], [place], [place]  [address] Phone: [phone]; Fax: [phone]

## 2022-06-22 ENCOUNTER — Ambulatory Visit: Payer: Medicare PPO | Admitting: Interventional Cardiology

## 2022-06-22 ENCOUNTER — Encounter: Payer: Self-pay | Admitting: Interventional Cardiology

## 2022-06-22 VITALS — BP 130/70 | HR 69 | Ht 62.5 in | Wt 112.0 lb

## 2022-06-22 DIAGNOSIS — E782 Mixed hyperlipidemia: Secondary | ICD-10-CM | POA: Diagnosis not present

## 2022-06-22 DIAGNOSIS — I25118 Atherosclerotic heart disease of native coronary artery with other forms of angina pectoris: Secondary | ICD-10-CM

## 2022-06-22 DIAGNOSIS — I4821 Permanent atrial fibrillation: Secondary | ICD-10-CM | POA: Diagnosis not present

## 2022-06-22 NOTE — Patient Instructions (Signed)

## 2022-07-02 DIAGNOSIS — L814 Other melanin hyperpigmentation: Secondary | ICD-10-CM | POA: Diagnosis not present

## 2022-07-02 DIAGNOSIS — L821 Other seborrheic keratosis: Secondary | ICD-10-CM | POA: Diagnosis not present

## 2022-07-02 DIAGNOSIS — L570 Actinic keratosis: Secondary | ICD-10-CM | POA: Diagnosis not present

## 2022-07-24 ENCOUNTER — Other Ambulatory Visit: Payer: Self-pay

## 2022-07-24 ENCOUNTER — Emergency Department: Payer: Medicare PPO

## 2022-07-24 ENCOUNTER — Emergency Department
Admission: EM | Admit: 2022-07-24 | Discharge: 2022-07-24 | Disposition: A | Discharge status: Home / Self Care | Payer: Medicare PPO | Attending: Student in an Organized Health Care Education/Training Program | Admitting: Student in an Organized Health Care Education/Training Program

## 2022-07-24 ENCOUNTER — Encounter: Payer: Self-pay | Admitting: Emergency Medicine

## 2022-07-24 DIAGNOSIS — S20222A Contusion of left back wall of thorax, initial encounter: Secondary | ICD-10-CM | POA: Insufficient documentation

## 2022-07-24 DIAGNOSIS — M545 Low back pain, unspecified: Secondary | ICD-10-CM | POA: Diagnosis not present

## 2022-07-24 DIAGNOSIS — I119 Hypertensive heart disease without heart failure: Secondary | ICD-10-CM | POA: Diagnosis not present

## 2022-07-24 DIAGNOSIS — S81811A Laceration without foreign body, right lower leg, initial encounter: Secondary | ICD-10-CM | POA: Diagnosis not present

## 2022-07-24 DIAGNOSIS — Y93K1 Activity, walking an animal: Secondary | ICD-10-CM | POA: Diagnosis not present

## 2022-07-24 DIAGNOSIS — Z23 Encounter for immunization: Secondary | ICD-10-CM | POA: Insufficient documentation

## 2022-07-24 DIAGNOSIS — I10 Essential (primary) hypertension: Secondary | ICD-10-CM | POA: Insufficient documentation

## 2022-07-24 DIAGNOSIS — Z7901 Long term (current) use of anticoagulants: Secondary | ICD-10-CM | POA: Insufficient documentation

## 2022-07-24 DIAGNOSIS — W010XXA Fall on same level from slipping, tripping and stumbling without subsequent striking against object, initial encounter: Secondary | ICD-10-CM | POA: Diagnosis not present

## 2022-07-24 DIAGNOSIS — I6782 Cerebral ischemia: Secondary | ICD-10-CM | POA: Insufficient documentation

## 2022-07-24 DIAGNOSIS — S3991XA Unspecified injury of abdomen, initial encounter: Secondary | ICD-10-CM | POA: Diagnosis not present

## 2022-07-24 DIAGNOSIS — M8588 Other specified disorders of bone density and structure, other site: Secondary | ICD-10-CM | POA: Diagnosis not present

## 2022-07-24 DIAGNOSIS — W19XXXA Unspecified fall, initial encounter: Secondary | ICD-10-CM

## 2022-07-24 DIAGNOSIS — S0990XA Unspecified injury of head, initial encounter: Secondary | ICD-10-CM | POA: Diagnosis not present

## 2022-07-24 DIAGNOSIS — S8991XA Unspecified injury of right lower leg, initial encounter: Secondary | ICD-10-CM | POA: Diagnosis present

## 2022-07-24 DIAGNOSIS — E039 Hypothyroidism, unspecified: Secondary | ICD-10-CM | POA: Diagnosis not present

## 2022-07-24 DIAGNOSIS — J449 Chronic obstructive pulmonary disease, unspecified: Secondary | ICD-10-CM | POA: Diagnosis not present

## 2022-07-24 DIAGNOSIS — I700 Atherosclerosis of aorta: Secondary | ICD-10-CM | POA: Diagnosis not present

## 2022-07-24 DIAGNOSIS — Z043 Encounter for examination and observation following other accident: Secondary | ICD-10-CM | POA: Diagnosis not present

## 2022-07-24 DIAGNOSIS — M48061 Spinal stenosis, lumbar region without neurogenic claudication: Secondary | ICD-10-CM | POA: Diagnosis not present

## 2022-07-24 DIAGNOSIS — S300XXA Contusion of lower back and pelvis, initial encounter: Secondary | ICD-10-CM | POA: Diagnosis not present

## 2022-07-24 DIAGNOSIS — I2510 Atherosclerotic heart disease of native coronary artery without angina pectoris: Secondary | ICD-10-CM | POA: Diagnosis not present

## 2022-07-24 DIAGNOSIS — K760 Fatty (change of) liver, not elsewhere classified: Secondary | ICD-10-CM | POA: Insufficient documentation

## 2022-07-24 DIAGNOSIS — R102 Pelvic and perineal pain: Secondary | ICD-10-CM | POA: Diagnosis not present

## 2022-07-24 DIAGNOSIS — K5730 Diverticulosis of large intestine without perforation or abscess without bleeding: Secondary | ICD-10-CM | POA: Insufficient documentation

## 2022-07-24 DIAGNOSIS — M4126 Other idiopathic scoliosis, lumbar region: Secondary | ICD-10-CM | POA: Diagnosis not present

## 2022-07-24 LAB — COMPREHENSIVE METABOLIC PANEL
ALT: 19 U/L (ref 0–44)
AST: 27 U/L (ref 15–41)
Albumin: 4.1 g/dL (ref 3.5–5.0)
Alkaline Phosphatase: 61 U/L (ref 38–126)
Anion gap: 4 — ABNORMAL LOW (ref 5–15)
BUN: 15 mg/dL (ref 8–23)
CO2: 24 mmol/L (ref 22–32)
Calcium: 9.4 mg/dL (ref 8.9–10.3)
Chloride: 112 mmol/L — ABNORMAL HIGH (ref 98–111)
Creatinine, Ser: 0.72 mg/dL (ref 0.44–1.00)
GFR, Estimated: >60 mL/min (ref >60)
Glucose, Bld: 89 mg/dL (ref 70–99)
Potassium: 4.5 mmol/L (ref 3.5–5.1)
Sodium: 140 mmol/L (ref 135–145)
Total Bilirubin: 1.4 mg/dL — ABNORMAL HIGH (ref 0.3–1.2)
Total Protein: 6.6 g/dL (ref 6.5–8.1)

## 2022-07-24 LAB — CBC WITH DIFFERENTIAL/PLATELET
Abs Immature Granulocytes: 0.01 10*3/uL (ref 0.00–0.07)
Basophils Absolute: 0 10*3/uL (ref 0.0–0.1)
Basophils Relative: 1 %
Eosinophils Absolute: 0.1 10*3/uL (ref 0.0–0.5)
Eosinophils Relative: 2 %
HCT: 33.4 % — ABNORMAL LOW (ref 36.0–46.0)
Hemoglobin: 10.5 g/dL — ABNORMAL LOW (ref 12.0–15.0)
Immature Granulocytes: 0 %
Lymphocytes Relative: 19 %
Lymphs Abs: 1 10*3/uL (ref 0.7–4.0)
MCH: 29.5 pg (ref 26.0–34.0)
MCHC: 31.4 g/dL (ref 30.0–36.0)
MCV: 93.8 fL (ref 80.0–100.0)
Monocytes Absolute: 0.6 10*3/uL (ref 0.1–1.0)
Monocytes Relative: 12 %
Neutro Abs: 3.3 10*3/uL (ref 1.7–7.7)
Neutrophils Relative %: 66 %
Platelets: 96 10*3/uL — ABNORMAL LOW (ref 150–400)
RBC: 3.56 MIL/uL — ABNORMAL LOW (ref 3.87–5.11)
RDW: 14 % (ref 11.5–15.5)
WBC: 5 10*3/uL (ref 4.0–10.5)
nRBC: 0 % (ref 0.0–0.2)

## 2022-07-24 MED ORDER — TETANUS-DIPHTH-ACELL PERTUSSIS 5-2.5-18.5 LF-MCG/0.5 IM SUSY
0.5000 mL | PREFILLED_SYRINGE | Freq: Once | INTRAMUSCULAR | Status: AC
Start: 2022-07-24 — End: 2022-07-24
  Administered 2022-07-24: 0.5 mL via INTRAMUSCULAR
  Filled 2022-07-24: qty 0.5

## 2022-07-24 MED ORDER — IOHEXOL 300 MG/ML  SOLN
100.0000 mL | Freq: Once | INTRAMUSCULAR | Status: AC | PRN
  Administered 2022-07-24: 100 mL via INTRAVENOUS

## 2022-07-24 MED ORDER — ACETAMINOPHEN 325 MG PO TABS
650.0000 mg | ORAL_TABLET | Freq: Once | ORAL | Status: AC
Start: 2022-07-24 — End: 2022-07-24
  Administered 2022-07-24: 650 mg via ORAL
  Filled 2022-07-24: qty 2

## 2022-07-24 MED ORDER — LIDOCAINE 5 % EX PTCH
1.0000 | MEDICATED_PATCH | CUTANEOUS | Status: DC
  Administered 2022-07-24: 1 via TRANSDERMAL
  Filled 2022-07-24: qty 1

## 2022-07-24 MED ORDER — LIDOCAINE 5 % EX PTCH: 1.0000 | MEDICATED_PATCH | Freq: Two times a day (BID) | CUTANEOUS | 0 refills | Status: AC

## 2022-07-24 NOTE — Discharge Instructions (Signed)
Your CT scans did not reveal any acute injuries.  Please follow-up with your outpatient provider this week as you have scheduled.  You may use the Lidoderm patches to help with your pain as we discussed.  Please return for any new, worsening, or changing symptoms or other concerns.  It is a pleasure caring for you today.

## 2022-07-24 NOTE — ED Notes (Signed)
Agree with previous RN , skin tear to RLE dressed per PA.

## 2022-07-24 NOTE — ED Triage Notes (Signed)
Pt reports was coming inside from walking her dog and she fell hurting her left leg and back. Pt reports lower back is pain but able to ambulate still. Pt reports skin tear to left leg and takes eloquis. Denies LOC

## 2022-07-24 NOTE — ED Provider Notes (Signed)
[facility] Provider Note    Event Date/Time   First MD Initiated Contact with Patient 07/24/22 [phone]     (approximate)   History   Fall, Leg Pain, and Back Pain   HPI  [name] is a 88 y.o. female with a past medical history of COPD, atrial fibrillation on Eliquis, hyperlipidemia, hypertension CAD who presents today for evaluation after a fall.  Patient reports that she was coming inside from walking her dog and she slipped on wet ground and injured her left leg and her low back.  She reports that she was on the stair and scraped her leg on the stair.  She reports that she also hit her low back on the stair.  She did not fall down the stairs.  She was able to get herself back up and walk into the house where she called her son.  She does not think that she struck her head, no LOC.  She reports that she has pain in her low back, and also scraped her right lower extremity on the stair.  She has not had any nausea or vomiting.  No headache or vision changes.  She is concerned because she is on Eliquis.  Patient Active Problem List   Diagnosis Date Noted   IDA (iron deficiency anemia) 07/02/2021   URI (upper respiratory infection) 10/17/2017   COPD (chronic obstructive pulmonary disease) ([place]) 01/14/2016   Arthritis 12/23/2015   Cataract 12/23/2015   Cardiac disease 12/23/2015   BP (high blood pressure) 12/23/2015   Shortness of breath 12/11/2015   Pleural effusion 12/11/2015   Chronic atrial fibrillation ([place])    Hyperlipidemia    Hypothyroidism    Essential hypertension    Mixed hyperlipidemia 11/08/2013   Disorder of bone and cartilage, unspecified 10/29/2013   Coronary atherosclerosis of native coronary artery 10/29/2013   Unspecified hypothyroidism 10/29/2013   Other and unspecified angina pectoris 10/29/2013          Physical Exam   Triage Vital Signs: ED Triage Vitals  Enc Vitals Group     BP 07/24/22 0840 134/89     Pulse Rate  07/24/22 0840 92     Resp 07/24/22 0840 16     Temp 07/24/22 0840 97.6 F (36.4 C)     Temp Source 07/24/22 0840 Oral     SpO2 07/24/22 0840 93 %     Weight 07/24/22 0838 111 lb 15.9 oz (50.8 kg)     Height 07/24/22 0838 5\' 2"  (1.575 m)     Head Circumference --      Peak Flow --      Pain Score --      Pain Loc --      Pain Edu? --      Excl. in [place]? --     Most recent vital signs: Vitals:   07/24/22 0840 07/24/22 1320  BP: 134/89 (!) 149/76  Pulse: 92 68  Resp: 16 16  Temp: 97.6 F (36.4 C) 97.8 F (36.6 C)  SpO2: 93% 95%    Physical Exam Vitals and nursing note reviewed.  Constitutional:      General: Awake and alert. No acute distress.    Appearance: Normal appearance. The patient is normal weight.  HENT:     Head: Normocephalic and atraumatic.  No hematoma, no battle sign or raccoon eyes    Mouth: Mucous membranes are moist.  Eyes:     General: PERRL. Normal EOMs  Right eye: No discharge.        Left eye: No discharge.     Conjunctiva/sclera: Conjunctivae normal.  Cardiovascular:     Rate and Rhythm: Normal rate .     Pulses: Normal pulses.  Pulmonary:     Effort: Pulmonary effort is normal. No respiratory distress.     Breath sounds: Normal breath sounds.  Chest wall tenderness or ecchymosis Abdominal:     Abdomen is soft. There is no abdominal tenderness. No rebound or guarding. No distention.  No abdominal tenderness or ecchymosis Musculoskeletal:        General: No swelling. Normal range of motion.     Cervical back: Normal range of motion and neck supple.  No midline cervical spine tenderness.  Full range of motion of neck.  Negative Spurling test.  Negative Lhermitte sign.  Normal strength and sensation in bilateral upper extremities. Normal grip strength bilaterally.  Normal intrinsic muscle function of the hand bilaterally.  Normal radial pulses bilaterally. Pelvis stable.  Able to actively lift both legs up off of the stretcher.  Negative  logroll bilaterally. Back: Very mild lumbar midline and paraspinal tenderness. Strength and sensation 5/5 to bilateral lower extremities. Normal great toe extension against resistance. Normal sensation throughout feet. Normal patellar reflexes. Negative SLR and opposite SLR bilaterally. Skin:    General: Skin is warm and dry.     Capillary Refill: Capillary refill takes less than 2 seconds.     Findings: Right lower extremity with superficial skin tears noted to the anterior shin.  No active bleeding.  Normal distal pulses.  No hematoma. Neurological:     Mental Status: The patient is awake and alert.  Neurological: GCS 15 alert and oriented x3 Normal speech, no expressive or receptive aphasia or dysarthria Cranial nerves II through XII intact Normal visual fields 5 out of 5 strength in all 4 extremities with intact sensation throughout No extremity drift Normal finger-to-nose testing, no limb or truncal ataxia     ED Results / Procedures / Treatments   Labs (all labs ordered are listed, but only abnormal results are displayed) Labs Reviewed  CBC WITH DIFFERENTIAL/PLATELET - Abnormal; Notable for the following components:      Result Value   RBC 3.56 (*)    Hemoglobin 10.5 (*)    HCT 33.4 (*)    Platelets 96 (*)    All other components within normal limits  COMPREHENSIVE METABOLIC PANEL - Abnormal; Notable for the following components:   Chloride 112 (*)    Total Bilirubin 1.4 (*)    Anion gap 4 (*)    All other components within normal limits     EKG     RADIOLOGY I independently reviewed and interpreted imaging and agree with radiologists findings.     PROCEDURES:  Critical Care performed:   Procedures   MEDICATIONS ORDERED IN ED: Medications  lidocaine (LIDODERM) 5 % 1 patch (1 patch Transdermal Patch Applied 07/24/22 0936)  Tdap (BOOSTRIX) injection 0.5 mL (0.5 mLs Intramuscular Given 07/24/22 0937)  acetaminophen (TYLENOL) tablet 650 mg (650 mg Oral  Given 07/24/22 0935)  iohexol (OMNIPAQUE) 300 MG/ML solution 100 mL (100 mLs Intravenous Contrast Given 07/24/22 1204)     IMPRESSION / MDM / ASSESSMENT AND PLAN / ED COURSE  I reviewed the triage vital signs and the nursing notes.   Differential diagnosis includes, but is not limited to, contusion, vertebral fracture, intracranial hemorrhage, cervical spine injury.  Patient is awake and alert, hemodynamically stable and  neurovascularly intact.  She is at her baseline per her family members who are with her.  CT head and neck obtained per [place] criteria and given that this was an unwitnessed fall.  Given that her only complaint is lumbar pain, CT of her L-spine was also obtained.  She has skin tears to her right lower extremity, these were cleaned and bandaged by the RN.  Her tetanus was updated given that the patient was unsure when her last tetanus shot was.  She has no chest wall tenderness or ecchymosis, no shortness of breath, no abdominal tenderness or ecchymosis, no hemodynamic instability, do not suspect intrathoracic or intra-abdominal injury.  She was treated symptomatically with improvement of her symptoms.  CT head and neck returned with no abnormal findings.  CT lumbar spine demonstrated small volume of free fluid in the pelvis which was noted to be nonspecific.  Given her trauma, CT abdomen and pelvis was obtained per recommendation of the radiologist for evaluation of abdominal or pelvic visceral injury.  CT AP demonstrated no acute traumatic injury of the abdomen or pelvis, and redemonstrates her pleural effusions which were seen on her previous x-ray and CT scans from May of this year which are being followed by her outpatient provider.  Findings were discussed with both the patient and her family members.  They understand and agree.  Patient feels well was able to ambulate unassisted to the bathroom.  She requested a prescription for the Lidoderm patches and this was sent to her  pharmacy.  We discussed return precautions and the importance of close outpatient follow-up.  Patient understands and agrees with plan.  She was discharged in stable condition.  Patient's presentation is most consistent with acute presentation with potential threat to life or bodily function.   Clinical Course as of 07/24/22 1346  Sat Jul 24, 2022  [id number] CT lumbar spine with evidence of free fluid in the pelvis.  Patient has no reproducible abdominal tenderness, however discussed these findings with patient and her family and they would like to proceed with CT abdomen and pelvis [JP]    Clinical Course User Index [JP] Gracin Mcpartland, Clarnce Flock, PA-C     FINAL CLINICAL IMPRESSION(S) / ED DIAGNOSES   Final diagnoses:  Fall, initial encounter  Contusion of left side of back, initial encounter  Noninfected skin tear of leg, right, initial encounter     Rx / DC Orders   ED Discharge Orders          Ordered    lidocaine (LIDODERM) 5 %  Every 12 hours        07/24/22 1239             Note:  This document was prepared using Dragon voice recognition software and may include unintentional dictation errors.   Emeline Gins 07/24/22 1346    Merlyn Lot, MD 07/24/22 1355

## 2022-07-27 ENCOUNTER — Other Ambulatory Visit (HOSPITAL_COMMUNITY): Payer: Self-pay | Admitting: Internal Medicine

## 2022-07-27 ENCOUNTER — Ambulatory Visit (HOSPITAL_COMMUNITY)
Admission: RE | Admit: 2022-07-27 | Discharge: 2022-07-27 | Disposition: A | Discharge status: Home / Self Care | Payer: Medicare PPO | Source: Ambulatory Visit | Attending: Vascular Surgery | Admitting: Vascular Surgery

## 2022-07-27 DIAGNOSIS — L989 Disorder of the skin and subcutaneous tissue, unspecified: Secondary | ICD-10-CM | POA: Diagnosis not present

## 2022-07-27 DIAGNOSIS — I739 Peripheral vascular disease, unspecified: Secondary | ICD-10-CM

## 2022-07-27 DIAGNOSIS — M545 Low back pain, unspecified: Secondary | ICD-10-CM | POA: Diagnosis not present

## 2022-07-27 DIAGNOSIS — G8911 Acute pain due to trauma: Secondary | ICD-10-CM | POA: Diagnosis not present

## 2022-08-03 ENCOUNTER — Ambulatory Visit: Payer: Medicare PPO | Admitting: Pulmonary Disease

## 2022-08-03 ENCOUNTER — Encounter: Payer: Self-pay | Admitting: Pulmonary Disease

## 2022-08-03 VITALS — BP 122/74 | HR 54 | Temp 97.7°F | Ht 62.5 in | Wt 108.8 lb

## 2022-08-03 DIAGNOSIS — J449 Chronic obstructive pulmonary disease, unspecified: Secondary | ICD-10-CM

## 2022-08-03 DIAGNOSIS — Z23 Encounter for immunization: Secondary | ICD-10-CM

## 2022-08-03 MED ORDER — STIOLTO RESPIMAT 2.5-2.5 MCG/ACT IN AERS: 2.0000 | INHALATION_SPRAY | Freq: Every day | RESPIRATORY_TRACT | 11 refills | Status: DC

## 2022-08-03 NOTE — Progress Notes (Signed)
[name]    [id number]    12/01/1931  Primary Care Physician:Skakle, Eliberto Ivory DO  Referring Physician: Charlane Ferretti, DO [address] [place],  [place] [address]  Chief complaint: Follow-up for COPD  HPI: 88 y.o. with COPD.  He is on Spiriva.  She states that she is doing well with this regimen.  Complains of mild dyspnea on exertion.  No cough, sputum production, fevers, chills.  Pets: Has a dog and a cat.  No birds, farm animals Occupation: Retired Chartered loss adjuster. Exposures: No known exposures, no mold, hot tub, Jacuzzi Smoking history: Never smoker Travel history: No significant travel history Relevant family history: No significant family history of lung disease  Interim history: Complains of worsening dyspnea on exertion over the past year.  She is intermittently on Lasix per Dr. Thornell Mule, primary care.  Continues on Spiriva. Denies any cough, sputum production, wheezing.  Outpatient Encounter Medications as of 08/03/2022  Medication Sig   alendronate (FOSAMAX) 70 MG tablet Take 70 mg by mouth once a week.    apixaban (ELIQUIS) 2.5 MG TABS tablet TAKE 1 TABLET BY MOUTH TWICE A DAY   atorvastatin (LIPITOR) 20 MG tablet TAKE 1 TABLET BY MOUTH EVERY DAY   Bioflavonoid Products (ESTER-C) TABS Take 1 tablet by mouth daily.   BIOTIN PO Take 1 tablet by mouth daily.   CALCIUM CARBONATE-VIT D-MIN PO Take by mouth daily.   famotidine (PEPCID) 20 MG tablet famotidine 20 mg tablet  Take 1 tablet twice a day by oral route.   ferrous sulfate 325 (65 FE) MG tablet Take 1 tablet (325 mg total) by mouth daily. (Patient taking differently: Take 325 mg by mouth 3 (three) times a week.)   furosemide (LASIX) 20 MG tablet TAKE 1 TABLET EACH DAY. (Patient taking differently: Taking 1 tablet  for 2 days then skip a day.)   Hypromellose (ARTIFICIAL TEARS OP) Place 1 drop into both eyes every 6 (six) hours as needed (dry eyes).   levothyroxine (SYNTHROID, LEVOTHROID) 75 MCG tablet Take  37.5-75 mcg by mouth daily before breakfast. 1 tablet 6 days a week and 1/2 tab every Friday.   metoprolol succinate (TOPROL-XL) 50 MG 24 hr tablet Take 1 tablet (50 mg total) by mouth daily.   Misc Natural Products (OSTEO BI-FLEX ADV JOINT SHIELD PO) Take 1 tablet by mouth 2 (two) times daily.    nitroGLYCERIN (NITROSTAT) 0.4 MG SL tablet Place 1 tablet (0.4 mg total) under the tongue every 5 (five) minutes as needed for chest pain.   Omega-3 Fatty Acids (FISH OIL) 1200 MG CAPS Take 1,200 mg by mouth 2 (two) times daily.   senna (SENNA LAX) 8.6 MG tablet Take 1 tablet (8.6 mg total) by mouth daily.   Tiotropium Bromide-Olodaterol (STIOLTO RESPIMAT) 2.5-2.5 MCG/ACT AERS INHALE 2 PUFFS BY MOUTH INTO THE LUNGS DAILY   vitamin B-12 (CYANOCOBALAMIN) 1000 MCG tablet Take 1,000 mcg by mouth daily.   [DISCONTINUED] azithromycin (ZITHROMAX) 250 MG tablet Take as directed   No facility-administered encounter medications on file as of 08/03/2022.   Physical Exam: Blood pressure 122/74, pulse (!) 54, temperature 97.7 F (36.5 C), temperature source Oral, height 5' 2.5" (1.588 m), weight 108 lb 12.8 oz (49.4 kg), SpO2 99 %. Gen:      No acute distress HEENT:  EOMI, sclera anicteric Neck:     No masses; no thyromegaly Lungs:    Clear to auscultation bilaterally; normal respiratory effort= CV:  Regular rate and rhythm; no murmurs Abd:      + bowel sounds; soft, non-tender; no palpable masses, no distension Ext:    No edema; adequate peripheral perfusion Skin:      Warm and dry; no rash Neuro: alert and oriented x 3 Psych: normal mood and affect   Data Reviewed: Imaging: CT chest 10/31/2015 mild bibasilar atelectasis, scarring. Chest x-ray 02/25/2021-hyperinflation consistent with COPD. I have reviewed the images personally.  PFTs: 12/17/2015 FVC 2.48 [120%], FEV1 1.09 [72%], F/F 44, TLC 103%, DLCO 68% Moderate obstruction.  Disproportionately blunted expiratory flow loop Minimal diffusion  defect  Labs: CBC 08/20/2016-WBC 5.4, eos 3%, absolute eosinophil count 162 CBC 11/22/2018-WBC 4.5, eos 3.9%, absolute eosinophil count 175 IgE 11/22/2018-7,   Alpha-1 antitrypsin 11/22/2018-144, PIMS Alpha-1 antitrypsin 02/25/2021- 171  Cardiac: Echocardiogram 11/25/2015- LVEF 50-55%, PA peak pressure 45 mm. Nuclear stress test 11/25/2015- normal study with no ischemia.  Assessment:  Moderate COPD She is a never smoker.  Has a diagnosis of COPD on record Not sure if the obstruction on the PFTs is accurate as there is a abnormal, disproportionately blunted expiratory flow loop suggestive of poor effort.  I have suggested to her that we can repeat the PFTs or spirometry but she does not want to go to the procedure again  Has gradually worsening dyspnea on exertion.  We will check a chest x-ray today and change Spiriva to stiolto Continues to stay active and is doing breathing exercises at home  Alpha-1 antitrypsin heterozygote Has normal levels with PI MS phenotype.  We will continue monitoring.  Plan/Recommendations: - Change Spiriva to stiolto  Marshell Garfinkel MD [facility] 08/03/2022, 2:36 PM  CC: Sueanne Margarita, DO

## 2022-08-03 NOTE — Patient Instructions (Signed)
I am glad you are stable with regard to reading Continue the stiolto inhaler.  We will call in refills for you Return to clinic in 1 year

## 2022-08-03 NOTE — Addendum Note (Signed)
Addended by: Elton Sin on: 08/03/2022 04:52 PM   Modules accepted: Orders

## 2022-08-03 NOTE — Progress Notes (Signed)
[name]    [id number]    1932/01/30  Primary Care Physician:Skakle, Liane Comber DO  Referring Physician: Sueanne Margarita, [place],  [place] [address]  Chief complaint: Follow-up for COPD  HPI: 88 year old with COPD.  She states that she is doing well with inhaler regimen.  Complains of mild dyspnea on exertion.  No cough, sputum production, fevers, chills.  Pets: Has a dog and a cat.  No birds, farm animals Occupation: Retired Radio producer. Exposures: No known exposures, no mold, hot tub, Jacuzzi Smoking history: Never smoker Travel history: No significant travel history Relevant family history: No significant family history of lung disease  Interim history: Spiriva changed to stiolto at last visit.  She feels better with this change No new complaints today  Outpatient Encounter Medications as of 08/03/2022  Medication Sig   alendronate (FOSAMAX) 70 MG tablet Take 70 mg by mouth once a week.    apixaban (ELIQUIS) 2.5 MG TABS tablet TAKE 1 TABLET BY MOUTH TWICE A DAY   atorvastatin (LIPITOR) 20 MG tablet TAKE 1 TABLET BY MOUTH EVERY DAY   Bioflavonoid Products (ESTER-C) TABS Take 1 tablet by mouth daily.   BIOTIN PO Take 1 tablet by mouth daily.   CALCIUM CARBONATE-VIT D-MIN PO Take by mouth daily.   famotidine (PEPCID) 20 MG tablet famotidine 20 mg tablet  Take 1 tablet twice a day by oral route.   ferrous sulfate 325 (65 FE) MG tablet Take 1 tablet (325 mg total) by mouth daily. (Patient taking differently: Take 325 mg by mouth 3 (three) times a week.)   furosemide (LASIX) 20 MG tablet TAKE 1 TABLET EACH DAY. (Patient taking differently: Taking 1 tablet  for 2 days then skip a day.)   Hypromellose (ARTIFICIAL TEARS OP) Place 1 drop into both eyes every 6 (six) hours as needed (dry eyes).   levothyroxine (SYNTHROID, LEVOTHROID) 75 MCG tablet Take 37.5-75 mcg by mouth daily before breakfast. 1 tablet 6 days a week and 1/2 tab every Friday.   metoprolol  succinate (TOPROL-XL) 50 MG 24 hr tablet Take 1 tablet (50 mg total) by mouth daily.   Misc Natural Products (OSTEO BI-FLEX ADV JOINT SHIELD PO) Take 1 tablet by mouth 2 (two) times daily.    nitroGLYCERIN (NITROSTAT) 0.4 MG SL tablet Place 1 tablet (0.4 mg total) under the tongue every 5 (five) minutes as needed for chest pain.   Omega-3 Fatty Acids (FISH OIL) 1200 MG CAPS Take 1,200 mg by mouth 2 (two) times daily.   senna (SENNA LAX) 8.6 MG tablet Take 1 tablet (8.6 mg total) by mouth daily.   Tiotropium Bromide-Olodaterol (STIOLTO RESPIMAT) 2.5-2.5 MCG/ACT AERS INHALE 2 PUFFS BY MOUTH INTO THE LUNGS DAILY   vitamin B-12 (CYANOCOBALAMIN) 1000 MCG tablet Take 1,000 mcg by mouth daily.   [DISCONTINUED] azithromycin (ZITHROMAX) 250 MG tablet Take as directed   No facility-administered encounter medications on file as of 08/03/2022.   Physical Exam: Blood pressure 118/60, pulse 74, temperature 97.7 F (36.5 C), temperature source Oral, height 5' 2.5" (1.588 m), weight 108 lb (49 kg), SpO2 98 %. Gen:      No acute distress HEENT:  EOMI, sclera anicteric Neck:     No masses; no thyromegaly Lungs:    Clear to auscultation bilaterally; normal respiratory effort CV:         Regular rate and rhythm; no murmurs Abd:      + bowel sounds; soft, non-tender; no  palpable masses, no distension Ext:    No edema; adequate peripheral perfusion Skin:      Warm and dry; no rash Neuro: alert and oriented x 3 Psych: normal mood and affect   Data Reviewed: Imaging: CT chest 10/31/2015 mild bibasilar atelectasis, scarring. Chest x-ray 02/25/2021-hyperinflation consistent with COPD. CT chest 03/10/2022-right lower lobe scarring which is stable since 2016.  No acute abnormalities. I have reviewed the images personally.  PFTs: 12/17/2015 FVC 2.48 [120%], FEV1 1.09 [72%], F/F 44, TLC 103%, DLCO 68% Moderate obstruction.  Disproportionately blunted expiratory flow loop Minimal diffusion defect  Labs: CBC  08/20/2016-WBC 5.4, eos 3%, absolute eosinophil count 162 CBC 11/22/2018-WBC 4.5, eos 3.9%, absolute eosinophil count 175 IgE 11/22/2018-7,   Alpha-1 antitrypsin 11/22/2018-144, PIMS Alpha-1 antitrypsin 02/25/2021- 171  Cardiac: Echocardiogram 11/25/2015- LVEF 50-55%, PA peak pressure 45 mm. Nuclear stress test 11/25/2015- normal study with no ischemia.  Assessment:  Moderate COPD She is a never smoker.  Has a diagnosis of COPD on record Not sure if the obstruction on the PFTs is accurate as there is a abnormal, disproportionately blunted expiratory flow loop suggestive of poor effort.  I have suggested to her that we can repeat the PFTs or spirometry but she does not want to go to the procedure again  Has gradually worsening dyspnea on exertion.  We will check a chest x-ray today and change Spiriva to stiolto Continues to stay active and is doing breathing exercises at home  Abnormal imaging Chest x-ray and CT from earlier this year reviewed with chronic scarring in right lower lobe which is stable since 2016.  This is likely benign and does not need further follow-up.  Alpha-1 antitrypsin heterozygote Has normal levels with PI MS phenotype.  We will continue monitoring.  Plan/Recommendations: Continue stiolto  Chilton Greathouse MD [facility] 08/03/2022, 2:34 PM  CC: Charlane Ferretti, DO

## 2022-08-06 DIAGNOSIS — L989 Disorder of the skin and subcutaneous tissue, unspecified: Secondary | ICD-10-CM | POA: Diagnosis not present

## 2022-08-18 DIAGNOSIS — M7062 Trochanteric bursitis, left hip: Secondary | ICD-10-CM | POA: Diagnosis not present

## 2022-08-18 DIAGNOSIS — M545 Low back pain, unspecified: Secondary | ICD-10-CM | POA: Diagnosis not present

## 2022-08-18 DIAGNOSIS — M47896 Other spondylosis, lumbar region: Secondary | ICD-10-CM | POA: Diagnosis not present

## 2022-09-01 DIAGNOSIS — M48061 Spinal stenosis, lumbar region without neurogenic claudication: Secondary | ICD-10-CM | POA: Diagnosis not present

## 2022-09-09 ENCOUNTER — Other Ambulatory Visit: Payer: Self-pay | Admitting: Interventional Cardiology

## 2022-09-09 DIAGNOSIS — I4821 Permanent atrial fibrillation: Secondary | ICD-10-CM

## 2022-09-09 NOTE — Telephone Encounter (Signed)
Eliquis 2.5mg  refill request received. Patient is 88 years old, weight-49.4kg, Crea-0.72 on 07/24/2022, Diagnosis-Afib, and last seen by [name] on 06/22/2022. Dose is appropriate based on dosing criteria. Will send in refill to requested pharmacy.

## 2022-11-10 DIAGNOSIS — M48061 Spinal stenosis, lumbar region without neurogenic claudication: Secondary | ICD-10-CM | POA: Diagnosis not present

## 2022-11-11 DIAGNOSIS — M48061 Spinal stenosis, lumbar region without neurogenic claudication: Secondary | ICD-10-CM | POA: Diagnosis not present

## 2022-11-16 DIAGNOSIS — R7989 Other specified abnormal findings of blood chemistry: Secondary | ICD-10-CM | POA: Diagnosis not present

## 2022-11-16 DIAGNOSIS — M810 Age-related osteoporosis without current pathological fracture: Secondary | ICD-10-CM | POA: Diagnosis not present

## 2022-11-16 DIAGNOSIS — E785 Hyperlipidemia, unspecified: Secondary | ICD-10-CM | POA: Diagnosis not present

## 2022-11-16 DIAGNOSIS — E039 Hypothyroidism, unspecified: Secondary | ICD-10-CM | POA: Diagnosis not present

## 2022-11-16 DIAGNOSIS — I10 Essential (primary) hypertension: Secondary | ICD-10-CM | POA: Diagnosis not present

## 2022-11-16 DIAGNOSIS — D509 Iron deficiency anemia, unspecified: Secondary | ICD-10-CM | POA: Diagnosis not present

## 2022-11-21 ENCOUNTER — Other Ambulatory Visit: Payer: Self-pay | Admitting: Pulmonary Disease

## 2022-11-23 DIAGNOSIS — I482 Chronic atrial fibrillation, unspecified: Secondary | ICD-10-CM | POA: Diagnosis not present

## 2022-11-23 DIAGNOSIS — Z0000 Encounter for general adult medical examination without abnormal findings: Secondary | ICD-10-CM | POA: Diagnosis not present

## 2022-11-23 DIAGNOSIS — I739 Peripheral vascular disease, unspecified: Secondary | ICD-10-CM | POA: Diagnosis not present

## 2022-11-23 DIAGNOSIS — I700 Atherosclerosis of aorta: Secondary | ICD-10-CM | POA: Diagnosis not present

## 2022-11-23 DIAGNOSIS — I5032 Chronic diastolic (congestive) heart failure: Secondary | ICD-10-CM | POA: Diagnosis not present

## 2022-11-23 DIAGNOSIS — I25118 Atherosclerotic heart disease of native coronary artery with other forms of angina pectoris: Secondary | ICD-10-CM | POA: Diagnosis not present

## 2022-11-23 DIAGNOSIS — D692 Other nonthrombocytopenic purpura: Secondary | ICD-10-CM | POA: Diagnosis not present

## 2022-11-23 DIAGNOSIS — I272 Pulmonary hypertension, unspecified: Secondary | ICD-10-CM | POA: Diagnosis not present

## 2022-11-23 DIAGNOSIS — D6869 Other thrombophilia: Secondary | ICD-10-CM | POA: Diagnosis not present

## 2022-11-23 DIAGNOSIS — R82998 Other abnormal findings in urine: Secondary | ICD-10-CM | POA: Diagnosis not present

## 2022-11-29 DIAGNOSIS — M48061 Spinal stenosis, lumbar region without neurogenic claudication: Secondary | ICD-10-CM | POA: Diagnosis not present

## 2022-11-30 DIAGNOSIS — M19011 Primary osteoarthritis, right shoulder: Secondary | ICD-10-CM | POA: Diagnosis not present

## 2022-11-30 DIAGNOSIS — M25511 Pain in right shoulder: Secondary | ICD-10-CM | POA: Diagnosis not present

## 2022-12-07 DIAGNOSIS — M48061 Spinal stenosis, lumbar region without neurogenic claudication: Secondary | ICD-10-CM | POA: Diagnosis not present

## 2022-12-14 DIAGNOSIS — M48061 Spinal stenosis, lumbar region without neurogenic claudication: Secondary | ICD-10-CM | POA: Diagnosis not present

## 2022-12-21 DIAGNOSIS — M48061 Spinal stenosis, lumbar region without neurogenic claudication: Secondary | ICD-10-CM | POA: Diagnosis not present

## 2022-12-22 DIAGNOSIS — M48061 Spinal stenosis, lumbar region without neurogenic claudication: Secondary | ICD-10-CM | POA: Diagnosis not present

## 2022-12-24 DIAGNOSIS — M48061 Spinal stenosis, lumbar region without neurogenic claudication: Secondary | ICD-10-CM | POA: Diagnosis not present

## 2023-01-04 ENCOUNTER — Telehealth: Payer: Self-pay | Admitting: Interventional Cardiology

## 2023-01-04 DIAGNOSIS — I25118 Atherosclerotic heart disease of native coronary artery with other forms of angina pectoris: Secondary | ICD-10-CM

## 2023-01-04 NOTE — Telephone Encounter (Signed)
Returned call to patient.  Patient states she has had chronic DOE, but has noticed it progressively worsening over the last month or so. Patient reports feeling as though she has no energy, she is used to be on the go and now gets short of breath when walking. She sees a pulmonologist for COPD, but is concerned that something may be going on with her heart.  Patient reports swelling in left leg/foot/ankle. Denies weight gain. Patient states she has been elevating her leg since Saturday and states this "helps some." She has also reduced intake of salty snacks. She reports some dark skin discoloration at ankle, and soreness of left knee/hip and back. Patient denies injuring left leg, states she had injured the right leg in the past after a fall. Patient denies any skin temperature differences in left leg.  Offered next available appointment with DOD Dr. Ali Lowe on 01/07/23 for evaluation to r/o cardiac cause for symptoms.  Will forward to Dr. Irish Lack and his nurse to review.

## 2023-01-04 NOTE — Telephone Encounter (Signed)
I am ok setting her up for a Lexiscan myoview to look for a heart issue. Thanks

## 2023-01-04 NOTE — Telephone Encounter (Signed)
Pt c/o Shortness Of Breath: STAT if SOB developed within the last 24 hours or pt is noticeably SOB on the phone  1. Are you currently SOB (can you hear that pt is SOB on the phone)?   No  2. How long have you been experiencing SOB?   Last month has gotten worse  3. Are you SOB when sitting or when up moving around?   When up and moving around  4. Are you currently experiencing any other symptoms? Dizzy, no energy, swelling in left leg, foot and ankle, hands feel "drawn", tight  Patient states she is on an inhaler and it has not been helping.  Patient states she has not had weight gain.

## 2023-01-05 DIAGNOSIS — D225 Melanocytic nevi of trunk: Secondary | ICD-10-CM | POA: Diagnosis not present

## 2023-01-05 DIAGNOSIS — L821 Other seborrheic keratosis: Secondary | ICD-10-CM | POA: Diagnosis not present

## 2023-01-05 DIAGNOSIS — D485 Neoplasm of uncertain behavior of skin: Secondary | ICD-10-CM | POA: Diagnosis not present

## 2023-01-05 DIAGNOSIS — L578 Other skin changes due to chronic exposure to nonionizing radiation: Secondary | ICD-10-CM | POA: Diagnosis not present

## 2023-01-05 DIAGNOSIS — L570 Actinic keratosis: Secondary | ICD-10-CM | POA: Diagnosis not present

## 2023-01-05 NOTE — Progress Notes (Signed)
Cardiology Office Note:    Date:  01/07/2023   ID:  [name], DOB [birth year], MRN [id number]  PCP:  Sueanne Margarita, [place] Providers Cardiologist:  Casandra Doffing; Lenna Sciara, MD (DOD visit) Referring MD: Sueanne Margarita, DO   Chief Complaint/Reason for Referral: For dyspnea on exertion and lower extremity edema  ASSESSMENT:    1. Dyspnea on exertion   2. Hyperlipidemia LDL goal <70   3. Primary hypertension   4. Permanent atrial fibrillation ([place])   5. Aortic atherosclerosis ([place])   6. S/P CABG x 2   7. Precordial pain     PLAN:    In order of problems listed above: 1.  Dyspnea: Carlton Adam has already been ordered by Dr. Irish Lack.  Will obtain an echocardiogram; increase Lasix to 20 mg twice daily and check BMP in 1 week.  Will arrange for 1 month follow-up with APP. 2.  Hyperlipidemia: Continue statin for now.  Given advanced age strict lipid lowering therapy may not be required. 3.  Hypertension: Blood pressure is well-controlled today. 4.  Atrial fibrillation:  Continue Eliquis; rate controlled today 5.  Aortic atherosclerosis: Continue Eliquis and lieu of aspirin, atorvastatin, and strict blood pressure control. 6.  Status post CABG: Lexiscan is pending; continue Eliquis along with aspirin, atorvastatin, and strict blood pressure control.             Dispo:  Return in about 4 weeks (around 02/04/2023).      Medication Adjustments/Labs and Tests Ordered: Current medicines are reviewed at length with the patient today.  Concerns regarding medicines are outlined above.  The following changes have been made:     Labs/tests ordered: Orders Placed This Encounter  Procedures   Basic Metabolic Panel (BMET)   Myocardial Perfusion Imaging   ECHOCARDIOGRAM COMPLETE    Medication Changes: Meds ordered this encounter  Medications   furosemide (LASIX) 20 MG tablet    Sig: Take 1 tablet (20 mg total) by mouth 2 (two) times daily. TAKE 1 TABLET EACH  DAY.    Dispense:  180 tablet    Refill:  3     Current medicines are reviewed at length with the patient today.  The patient does not have concerns regarding medicines.   History of Present Illness:    FOCUSED PROBLEM LIST:   1.  Coronary artery disease status post CABG with LIMA to LAD and vein graft to left circumflex 2007 2.  Hypertension 3.  Hyperlipidemia 4.  Atrial fibrillation on Eliquis 5.  Aortic atherosclerosis on CT abdomen pelvis 2023  The patient is a 89 y.o. female with the indicated medical history here for expedited DOD appointment given leg swelling.  The patient was last seen by Dr. Irish Lack in August of this year.  She is doing well without significant issues.  She reached out to our office regarding shortness of breath over the last month.  She had noticed some increasing lower extremity edema as well as dyspnea on exertion she was referred for [place] study.  The patient has experienced a 7 pound weight gain in the last several weeks.  She has noticed early satiety, orthopnea, and increasing peripheral edema.  She has been short of breath with exertion as well.  She had an episode of fleeting very transient chest discomfort when she was sitting watching TV last Saturday but otherwise she denies any angina.  She fortunately has not required any emergency room visits or hospitalizations.  She denies any severe bleeding  or bruising while on Eliquis.          Current Medications: Current Meds  Medication Sig   alendronate (FOSAMAX) 70 MG tablet Take 70 mg by mouth once a week.    apixaban (ELIQUIS) 2.5 MG TABS tablet TAKE 1 TABLET BY MOUTH TWICE A DAY   atorvastatin (LIPITOR) 20 MG tablet TAKE 1 TABLET BY MOUTH EVERY DAY   Bioflavonoid Products (ESTER-C) TABS Take 1 tablet by mouth daily.   BIOTIN PO Take 1 tablet by mouth daily.   CALCIUM CARBONATE-VIT D-MIN PO Take by mouth daily.   famotidine (PEPCID) 20 MG tablet famotidine 20 mg tablet  Take 1 tablet twice  a day by oral route.   ferrous sulfate 325 (65 FE) MG tablet Take 1 tablet (325 mg total) by mouth daily. (Patient taking differently: Take 325 mg by mouth 3 (three) times a week.)   Hypromellose (ARTIFICIAL TEARS OP) Place 1 drop into both eyes every 6 (six) hours as needed (dry eyes).   levothyroxine (SYNTHROID, LEVOTHROID) 75 MCG tablet Take 37.5-75 mcg by mouth daily before breakfast. 1 tablet 6 days a week and 1/2 tab every Friday.   metoprolol succinate (TOPROL-XL) 50 MG 24 hr tablet Take 1 tablet (50 mg total) by mouth daily.   Misc Natural Products (OSTEO BI-FLEX ADV JOINT SHIELD PO) Take 1 tablet by mouth 2 (two) times daily.    nitroGLYCERIN (NITROSTAT) 0.4 MG SL tablet Place 1 tablet (0.4 mg total) under the tongue every 5 (five) minutes as needed for chest pain.   Omega-3 Fatty Acids (FISH OIL) 1200 MG CAPS Take 1,200 mg by mouth 2 (two) times daily.   senna (SENNA LAX) 8.6 MG tablet Take 1 tablet (8.6 mg total) by mouth daily.   Tiotropium Bromide-Olodaterol (STIOLTO RESPIMAT) 2.5-2.5 MCG/ACT AERS INHALE 2 PUFFS BY MOUTH INTO THE LUNGS DAILY   vitamin B-12 (CYANOCOBALAMIN) 1000 MCG tablet Take 1,000 mcg by mouth daily.   [DISCONTINUED] furosemide (LASIX) 20 MG tablet TAKE 1 TABLET EACH DAY. (Patient taking differently: Taking 1 tablet  for 2 days then skip a day.)     Allergies:    Sulfa antibiotics   Social History:   Social History   Tobacco Use   Smoking status: Never    Passive exposure: Past   Smokeless tobacco: Never  Vaping Use   Vaping Use: Never used  Substance Use Topics   Alcohol use: No    Alcohol/week: 0.0 standard drinks of alcohol   Drug use: No     Family Hx: Family History  Problem Relation Age of Onset   Other Mother    Heart disease Mother    Syncope episode Mother    Ulcers Mother    Heart disease Father    Heart attack Father    CAD Father    Diabetes Father    Lymphoma Sister    Cancer Brother        throat   Brain cancer Brother     Throat cancer Brother    Diabetes Brother    Diabetes type II Son    Colon cancer Neg Hx    Esophageal cancer Neg Hx    Stomach cancer Neg Hx    Pancreatic cancer Neg Hx      Review of Systems:   Please see the history of present illness.    All other systems reviewed and are negative.     EKGs/Labs/Other Test Reviewed:    EKG:  EKG performed August 2023  that I personally reviewed demonstrates atrial fibrillation rate controlled.  Prior CV studies:  Cardiac Studies & Procedures     STRESS TESTS  MYOCARDIAL PERFUSION IMAGING 11/25/2015  Narrative  Nuclear stress EF: 62%.  There was no ST segment deviation noted during stress.  The study is normal.  The left ventricular ejection fraction is normal (55-65%).  1. No ischemia or infarction. 2. Normal EF and wall motion. 3. Normal study.   ECHOCARDIOGRAM  ECHOCARDIOGRAM COMPLETE 06/01/2022  Narrative ECHOCARDIOGRAM REPORT    Patient Name:   [name] Date of Exam: 06/01/2022 Medical Rec #:  [id number]      Height:       62.5 in Accession #:    [id number]     Weight:       108.0 lb Date of Birth:  [birth year]       BSA:          1.480 m Patient Age:    4 years       BP:           148/78 mmHg Patient Gender: F              HR:           62 bpm. Exam Location:  [facility]  Procedure: 2D Echo, Cardiac Doppler and Color Doppler  Indications:    I50.32 CHF  History:        Patient has prior history of Echocardiogram examinations, most recent 11/25/2015. CHF, COPD, Arrythmias:Atrial Fibrillation; Risk Factors:Hypertension and Dyslipidemia.  Sonographer:    Coralyn Helling RDCS Referring Phys: [id number] [place]   1. Left ventricular ejection fraction, by estimation, is 55 to 60%. The left ventricle has normal function. The left ventricle has no regional wall motion abnormalities. Left ventricular diastolic parameters are indeterminate. 2. Right ventricular systolic function is normal. The  right ventricular size is moderately enlarged. There is moderately elevated pulmonary artery systolic pressure. 3. Left atrial size was severely dilated. 4. Right atrial size was severely dilated. 5. The mitral valve is normal in structure. Mild mitral valve regurgitation. No evidence of mitral stenosis. Moderate mitral annular calcification. 6. Tricuspid valve regurgitation is severe. 7. The aortic valve is normal in structure. Aortic valve regurgitation is not visualized. No aortic stenosis is present. 8. Pulmonic valve regurgitation is moderate. 9. The inferior vena cava is normal in size with greater than 50% respiratory variability, suggesting right atrial pressure of 3 mmHg.  FINDINGS Left Ventricle: Left ventricular ejection fraction, by estimation, is 55 to 60%. The left ventricle has normal function. The left ventricle has no regional wall motion abnormalities. The left ventricular internal cavity size was normal in size. There is no left ventricular hypertrophy. Left ventricular diastolic parameters are indeterminate.  Right Ventricle: The right ventricular size is moderately enlarged. No increase in right ventricular wall thickness. Right ventricular systolic function is normal. There is moderately elevated pulmonary artery systolic pressure. The tricuspid regurgitant velocity is 3.26 m/s, and with an assumed right atrial pressure of 8 mmHg, the estimated right ventricular systolic pressure is 29.5 mmHg.  Left Atrium: Left atrial size was severely dilated.  Right Atrium: Right atrial size was severely dilated.  Pericardium: There is no evidence of pericardial effusion.  Mitral Valve: The mitral valve is normal in structure. Moderate mitral annular calcification. Mild mitral valve regurgitation. No evidence of mitral valve stenosis.  Tricuspid Valve: The tricuspid valve is normal in structure. Tricuspid valve regurgitation is severe. No evidence  of tricuspid stenosis.  Aortic  Valve: The aortic valve is normal in structure. Aortic valve regurgitation is not visualized. No aortic stenosis is present.  Pulmonic Valve: The pulmonic valve was normal in structure. Pulmonic valve regurgitation is moderate. No evidence of pulmonic stenosis.  Aorta: The aortic root is normal in size and structure.  Venous: The inferior vena cava is normal in size with greater than 50% respiratory variability, suggesting right atrial pressure of 3 mmHg.  IAS/Shunts: No atrial level shunt detected by color flow Doppler.   LEFT VENTRICLE PLAX 2D LVIDd:         4.30 cm   Diastology LVIDs:         3.00 cm   LV e' medial:    9.89 cm/s LV PW:         1.00 cm   LV E/e' medial:  13.4 LV IVS:        1.00 cm   LV e' lateral:   16.53 cm/s LVOT diam:     1.80 cm   LV E/e' lateral: 8.0 LV SV:         42 LV SV Index:   28 LVOT Area:     2.54 cm   RIGHT VENTRICLE            IVC RVSP:           50.5 mmHg  IVC diam: 2.50 cm  LEFT ATRIUM             Index        RIGHT ATRIUM           Index LA diam:        5.00 cm 3.38 cm/m   RA Pressure: 8.00 mmHg LA Vol (A2C):   60.9 ml 41.15 ml/m  RA Area:     35.80 cm LA Vol (A4C):   74.9 ml 50.61 ml/m  RA Volume:   145.00 ml 97.97 ml/m LA Biplane Vol: 68.7 ml 46.42 ml/m AORTIC VALVE LVOT Vmax:   76.16 cm/s LVOT Vmean:  53.380 cm/s LVOT VTI:    0.165 m  AORTA Ao Root diam: 3.20 cm Ao Asc diam:  3.30 cm  MITRAL VALVE                TRICUSPID VALVE MV Area (PHT): 3.88 cm     TR Peak grad:   42.5 mmHg MV Decel Time: 195 msec     TR Vmax:        326.00 cm/s MV E velocity: 133.00 cm/s  Estimated RAP:  8.00 mmHg RVSP:           50.5 mmHg  SHUNTS Systemic VTI:  0.16 m Systemic Diam: 1.80 cm  Skeet Latch MD Electronically signed by Skeet Latch MD Signature Date/Time: 06/01/2022/4:34:37 PM    Final             Other studies Reviewed: Review of the additional studies/records demonstrates: Aortic atherosclerosis on CT abdomen  pelvis 2023  Recent Labs: 07/24/2022: ALT 19; BUN 15; Creatinine, Ser 0.72; Hemoglobin 10.5; Platelets 96; Potassium 4.5; Sodium 140   Recent Lipid Panel No results found for: "CHOL", "TRIG", "HDL", "LDLCALC", "LDLDIRECT"  Risk Assessment/Calculations:     CHA2DS2-VASc Score = 5   This indicates a 7.2% annual risk of stroke. The patient's score is based upon: CHF History: 1 HTN History: 1 Diabetes History: 0 Stroke History: 0 Vascular Disease History: 0 Age Score: 2 Gender Score: 1  Physical Exam:    VS:  BP 122/80   Pulse 67   Ht 5' 2.5" (1.588 m)   Wt 117 lb 12.8 oz (53.4 kg)   SpO2 96%   BMI 21.20 kg/m    Wt Readings from Last 3 Encounters:  01/07/23 117 lb 12.8 oz (53.4 kg)  08/03/22 108 lb 12.8 oz (49.4 kg)  07/24/22 111 lb 15.9 oz (50.8 kg)    GENERAL:  No apparent distress, AOx3 HEENT:  No carotid bruits, +2 carotid impulses, no scleral icterus CAR: RRR no murmurs, gallops, rubs, or thrills RES:  Clear to auscultation bilaterally ABD:  Soft, nontender, nondistended, positive bowel sounds x 4 VASC:  +2 radial pulses, +2 carotid pulses, palpable pedal pulses NEURO:  CN 2-12 grossly intact; motor and sensory grossly intact PSYCH:  No active depression or anxiety EXT:  Trace edema, without ecchymosis, or cyanosis  Signed, Early Osmond, MD  01/07/2023 9:57 AM    [address], [place], [place]  [address] Phone: [phone]; Fax: [phone]   Note:  This document was prepared using Dragon voice recognition software and may include unintentional dictation errors.

## 2023-01-07 ENCOUNTER — Ambulatory Visit: Payer: Medicare PPO | Attending: Internal Medicine | Admitting: Internal Medicine

## 2023-01-07 ENCOUNTER — Other Ambulatory Visit: Payer: Self-pay | Admitting: Internal Medicine

## 2023-01-07 ENCOUNTER — Encounter: Payer: Self-pay | Admitting: Internal Medicine

## 2023-01-07 VITALS — BP 122/80 | HR 67 | Ht 62.5 in | Wt 117.8 lb

## 2023-01-07 DIAGNOSIS — E785 Hyperlipidemia, unspecified: Secondary | ICD-10-CM | POA: Diagnosis not present

## 2023-01-07 DIAGNOSIS — R072 Precordial pain: Secondary | ICD-10-CM

## 2023-01-07 DIAGNOSIS — I10 Essential (primary) hypertension: Secondary | ICD-10-CM | POA: Diagnosis not present

## 2023-01-07 DIAGNOSIS — I700 Atherosclerosis of aorta: Secondary | ICD-10-CM | POA: Diagnosis not present

## 2023-01-07 DIAGNOSIS — Z951 Presence of aortocoronary bypass graft: Secondary | ICD-10-CM | POA: Diagnosis not present

## 2023-01-07 DIAGNOSIS — R0609 Other forms of dyspnea: Secondary | ICD-10-CM

## 2023-01-07 DIAGNOSIS — I4821 Permanent atrial fibrillation: Secondary | ICD-10-CM

## 2023-01-07 MED ORDER — FUROSEMIDE 20 MG PO TABS: 20.0000 mg | ORAL_TABLET | Freq: Two times a day (BID) | ORAL | 3 refills | Status: DC

## 2023-01-07 NOTE — Addendum Note (Signed)
Addended by: Aris Georgia, Ki Corbo L on: 01/07/2023 10:54 AM   Modules accepted: Orders

## 2023-01-07 NOTE — Patient Instructions (Signed)
Medication Instructions:  Your physician has recommended you make the following change in your medication:  1-Increase Furosemide 20 mg by mouth twice daily.  *If you need a refill on your cardiac medications before your next appointment, please call your pharmacy* Lab Work: Your physician recommends that you return for lab work in: 1 week for BMET  If you have labs (blood work) drawn today and your tests are completely normal, you will receive your results only by: [facility] [place] (if you have MyChart) OR A paper copy in the mail If you have any lab test that is abnormal or we need to change your treatment, we will call you to review the results.   Testing/Procedures: Your physician has requested that you have an echocardiogram. Echocardiography is a painless test that uses sound waves to create images of your heart. It provides your doctor with information about the size and shape of your heart and how well your heart's chambers and valves are working. This procedure takes approximately one hour. There are no restrictions for this procedure. Please do NOT wear cologne, perfume, aftershave, or lotions (deodorant is allowed). Please arrive 15 minutes prior to your appointment time.  Your physician has requested that you have a lexiscan myoview. For further information please visit [email or web address]. Please follow instruction sheet, as given.  Follow-Up: At [facility], you and your health needs are our priority.  As part of our continuing mission to provide you with exceptional heart care, we have created designated Provider Care Teams.  These Care Teams include your primary Cardiologist (physician) and Advanced Practice Providers (APPs -  Physician Assistants and Nurse Practitioners) who all work together to provide you with the care you need, when you need it.  We recommend signing up for the patient portal called "MyChart".  Sign up information is provided on this After Visit  Summary.  MyChart is used to connect with patients for Virtual Visits (Telemedicine).  Patients are able to view lab/test results, encounter notes, upcoming appointments, etc.  Non-urgent messages can be sent to your provider as well.   To learn more about what you can do with MyChart, go to [email or web address].    Your next appointment:   1 month(s)  Provider:   Nicholes Rough, PA-C, Melina Copa, PA-C, Ambrose Pancoast, NP, Cecilie Kicks, NP, Ermalinda Barrios, PA-C, Christen Bame, NP, or Richardson Dopp, PA-C

## 2023-01-10 NOTE — Addendum Note (Signed)
Addended by: Aris Georgia, Patrisia Faeth L on: 01/10/2023 02:38 PM   Modules accepted: Orders

## 2023-01-10 NOTE — Addendum Note (Signed)
Addended by: Jettie Booze on: 01/10/2023 03:28 PM   Modules accepted: Orders

## 2023-01-17 ENCOUNTER — Ambulatory Visit: Payer: Medicare PPO | Attending: Internal Medicine

## 2023-01-17 DIAGNOSIS — E785 Hyperlipidemia, unspecified: Secondary | ICD-10-CM | POA: Diagnosis not present

## 2023-01-17 DIAGNOSIS — R0609 Other forms of dyspnea: Secondary | ICD-10-CM | POA: Diagnosis not present

## 2023-01-17 DIAGNOSIS — Z951 Presence of aortocoronary bypass graft: Secondary | ICD-10-CM | POA: Diagnosis not present

## 2023-01-17 DIAGNOSIS — I4821 Permanent atrial fibrillation: Secondary | ICD-10-CM | POA: Diagnosis not present

## 2023-01-17 DIAGNOSIS — I700 Atherosclerosis of aorta: Secondary | ICD-10-CM | POA: Diagnosis not present

## 2023-01-17 DIAGNOSIS — I10 Essential (primary) hypertension: Secondary | ICD-10-CM | POA: Diagnosis not present

## 2023-01-19 DIAGNOSIS — M48061 Spinal stenosis, lumbar region without neurogenic claudication: Secondary | ICD-10-CM | POA: Diagnosis not present

## 2023-01-25 ENCOUNTER — Other Ambulatory Visit: Payer: Self-pay | Admitting: Interventional Cardiology

## 2023-01-25 DIAGNOSIS — I10 Essential (primary) hypertension: Secondary | ICD-10-CM

## 2023-01-25 DIAGNOSIS — I4821 Permanent atrial fibrillation: Secondary | ICD-10-CM

## 2023-01-25 NOTE — Telephone Encounter (Signed)
Prescription refill request for Eliquis received. Indication: Afib  Last office visit: 01/07/23 [name])  Scr: 0.72 (02/08/23)  Age: 89 Weight: 53.4kg  Appropriate dose. Refill sent.

## 2023-02-03 ENCOUNTER — Telehealth (HOSPITAL_COMMUNITY): Payer: Self-pay | Admitting: *Deleted

## 2023-02-03 NOTE — Telephone Encounter (Signed)
Per DPR left detailed instructions for MPI study scheduled on 02/08/23.

## 2023-02-08 ENCOUNTER — Ambulatory Visit (HOSPITAL_COMMUNITY): Payer: Medicare PPO | Attending: Internal Medicine

## 2023-02-08 ENCOUNTER — Ambulatory Visit (HOSPITAL_BASED_OUTPATIENT_CLINIC_OR_DEPARTMENT_OTHER): Payer: Medicare PPO

## 2023-02-08 ENCOUNTER — Telehealth: Payer: Self-pay | Admitting: Interventional Cardiology

## 2023-02-08 DIAGNOSIS — I10 Essential (primary) hypertension: Secondary | ICD-10-CM | POA: Diagnosis not present

## 2023-02-08 DIAGNOSIS — I700 Atherosclerosis of aorta: Secondary | ICD-10-CM | POA: Insufficient documentation

## 2023-02-08 DIAGNOSIS — R072 Precordial pain: Secondary | ICD-10-CM | POA: Insufficient documentation

## 2023-02-08 DIAGNOSIS — R0609 Other forms of dyspnea: Secondary | ICD-10-CM | POA: Diagnosis not present

## 2023-02-08 DIAGNOSIS — I4821 Permanent atrial fibrillation: Secondary | ICD-10-CM | POA: Diagnosis not present

## 2023-02-08 DIAGNOSIS — E785 Hyperlipidemia, unspecified: Secondary | ICD-10-CM | POA: Diagnosis not present

## 2023-02-08 DIAGNOSIS — Z951 Presence of aortocoronary bypass graft: Secondary | ICD-10-CM | POA: Diagnosis not present

## 2023-02-08 LAB — MYOCARDIAL PERFUSION IMAGING
LV dias vol: 46 mL (ref 46–106)
LV sys vol: 20 mL
Nuc Stress EF: 56 %
Peak HR: 81 {beats}/min
Rest HR: 70 {beats}/min
Rest Nuclear Isotope Dose: 10.2 mCi
SDS: 2
SRS: 9
SSS: 12
ST Depression (mm): 0 mm
Stress Nuclear Isotope Dose: 30.9 mCi
TID: 1.03

## 2023-02-08 LAB — ECHOCARDIOGRAM COMPLETE
Height: 62.5 in
S' Lateral: 2.8 cm
Weight: 1872 oz

## 2023-02-08 MED ORDER — REGADENOSON 0.4 MG/5ML IV SOLN
0.4000 mg | Freq: Once | INTRAVENOUS | Status: AC
  Administered 2023-02-08: 0.4 mg via INTRAVENOUS

## 2023-02-08 MED ORDER — TECHNETIUM TC 99M TETROFOSMIN IV KIT
10.2000 | PACK | Freq: Once | INTRAVENOUS | Status: AC | PRN
  Administered 2023-02-08: 10.2 via INTRAVENOUS

## 2023-02-08 MED ORDER — TECHNETIUM TC 99M TETROFOSMIN IV KIT
30.9000 | PACK | Freq: Once | INTRAVENOUS | Status: AC | PRN
  Administered 2023-02-08: 30.9 via INTRAVENOUS

## 2023-02-08 NOTE — Telephone Encounter (Signed)
Chart updated

## 2023-02-08 NOTE — Telephone Encounter (Signed)
This patient wanted to make sure that we change her pharmacy to the [facility] in [place].

## 2023-02-09 NOTE — Progress Notes (Signed)
Normal stress test

## 2023-02-14 NOTE — H&P (View-Only) (Signed)
Cardiology Office Note   Date:  02/14/2023   ID:  [name], DOB Apr 15, 1932, MRN [id number]  PCP:  Charlane Ferretti, DO    No chief complaint on file.  Shortness of breath/CAD  Wt Readings from Last 3 Encounters:  02/08/23 117 lb (53.1 kg)  01/07/23 117 lb 12.8 oz (53.4 kg)  08/03/22 108 lb 12.8 oz (49.4 kg)       History of Present Illness: [name] is a 89 y.o. female  who had CABG in 2007 for left main disease, by Dr. Dorris Fetch. She had hysterectomy in May 2012 and did well.     Her husband passed away many years ago.   She has had some hip pain which has limited her walking and dancing.  Very little relief from the shot she had in her hip.   In Jan 2018, she had a colonoscopy that was ok.     She has bowled in the past and had some DOE with square dancing.  Sx were mild and were managed conservatively.  She has had some atypical chest pains over the years.    She got a puppy to help encourage activity.  She has stopped sleeping pills as well.    Terms of dyspnea have progressed over the past 6 to 8 months.  Most recent echocardiogram in April 2024 showed: "Left ventricular ejection fraction, by estimation, is 55 to 60%. The  left ventricle has normal function. The left ventricle has no regional  wall motion abnormalities. Left ventricular diastolic function could not  be evaluated.   2. Right ventricular systolic function is normal. The right ventricular  size is moderately enlarged. There is severely elevated pulmonary artery  systolic pressure. The estimated right ventricular systolic pressure is  126.1 mmHg.   3. Left atrial size was severely dilated.   4. Right atrial size was moderately dilated.   5. The mitral valve is normal in structure. Mild to moderate mitral valve  regurgitation. No evidence of mitral stenosis.   6. Tricuspid valve leaflets do not appear to coapt completely. Tricuspid  valve regurgitation is severe.   7. The aortic valve is  normal in structure. There is mild calcification  of the aortic valve. There is moderate thickening of the aortic valve.  Aortic valve regurgitation is not visualized. Aortic valve  sclerosis/calcification is present, without any  evidence of aortic stenosis.   8. The inferior vena cava is dilated in size with <50% respiratory  variability, suggesting right atrial pressure of 15 mmHg.   Comparison(s): Changes from prior study are noted. Now PASP severely  elevated (prior 50 mmHg, current 126 mmHg).   Conclusion(s)/Recommendation(s): Severe pulmonary hypertension, severe TR  with noncoaptation of leaflets, moderately dilated RV. "  She continues to bowl.  Team is in the first place in her week.  She has had to give up square dancing due to her shortness of breath.  She has some minimal ankle edema.  She has not had trouble getting shoes on.  Past Medical History:  Diagnosis Date   Chronic atrial fibrillation (HCC)    a. Pt declined cardioversion.   Coronary artery disease    a. s/p CABGx2 06/2006 (LIMA-LAD, SVG-LCx at bifurcation of OM II/III). b. Nuc 11/2012: no evidence of ischemia, attenuation artifact c/w breast attenuation in anterior region, essentially low risk, EF 88%.   Essential hypertension    Hyperlipidemia    Hypothyroidism    Insomnia    on Pajaros,  failed other meds   Iron deficiency anemia    Osteoarthritis    Osteopenia    involved on BMD 06/01/07 (T score-1.6); slightly worse in 3/13 (T score -1.6; FRAX 3%  and 12%)-plan rechcekin 2016   Uterine prolapse    post hysterectomy, Dr. Vincente Poli   UTI (lower urinary tract infection) 3/11    Past Surgical History:  Procedure Laterality Date   ABDOMINAL HYSTERECTOMY     APPENDECTOMY     CATARACT EXTRACTION, BILATERAL  11/02/2007   COLONOSCOPY WITH PROPOFOL N/A 11/30/2016   Procedure: COLONOSCOPY WITH PROPOFOL;  Surgeon: Charolett Bumpers, MD;  Location: [facility];  Service: Endoscopy;  Laterality: N/A;   CORONARY ARTERY  BYPASS GRAFT  11/01/2005   ESOPHAGOGASTRODUODENOSCOPY N/A 11/30/2016   Procedure: ESOPHAGOGASTRODUODENOSCOPY (EGD);  Surgeon: Charolett Bumpers, MD;  Location: [facility];  Service: Endoscopy;  Laterality: N/A;   LIPOMA EXCISION     OVARIAN CYST REMOVAL     ROTATOR CUFF REPAIR     TEAR DUCT PROBING     X2 on left and x1 on right   TONSILLECTOMY       Current Outpatient Medications  Medication Sig Dispense Refill   alendronate (FOSAMAX) 70 MG tablet Take 70 mg by mouth once a week.      atorvastatin (LIPITOR) 20 MG tablet TAKE 1 TABLET BY MOUTH EVERY DAY 90 tablet 2   Bioflavonoid Products (ESTER-C) TABS Take 1 tablet by mouth daily.     BIOTIN PO Take 1 tablet by mouth daily.     CALCIUM CARBONATE-VIT D-MIN PO Take by mouth daily.     ELIQUIS 2.5 MG TABS tablet TAKE 1 TABLET BY MOUTH TWICE A DAY 180 tablet 1   famotidine (PEPCID) 20 MG tablet famotidine 20 mg tablet  Take 1 tablet twice a day by oral route.     ferrous sulfate 325 (65 FE) MG tablet Take 1 tablet (325 mg total) by mouth daily. (Patient taking differently: Take 325 mg by mouth 3 (three) times a week.)     furosemide (LASIX) 20 MG tablet Take 1 tablet (20 mg total) by mouth 2 (two) times daily. 180 tablet 3   Hypromellose (ARTIFICIAL TEARS OP) Place 1 drop into both eyes every 6 (six) hours as needed (dry eyes).     levothyroxine (SYNTHROID, LEVOTHROID) 75 MCG tablet Take 37.5-75 mcg by mouth daily before breakfast. 1 tablet 6 days a week and 1/2 tab every Friday.     metoprolol succinate (TOPROL-XL) 50 MG 24 hr tablet Take 1 tablet (50 mg total) by mouth daily. 90 tablet 3   Misc Natural Products (OSTEO BI-FLEX ADV JOINT SHIELD PO) Take 1 tablet by mouth 2 (two) times daily.      nitroGLYCERIN (NITROSTAT) 0.4 MG SL tablet Place 1 tablet (0.4 mg total) under the tongue every 5 (five) minutes as needed for chest pain. 25 tablet 0   Omega-3 Fatty Acids (FISH OIL) 1200 MG CAPS Take 1,200 mg by mouth 2 (two) times daily.      senna (SENNA LAX) 8.6 MG tablet Take 1 tablet (8.6 mg total) by mouth daily.     Tiotropium Bromide-Olodaterol (STIOLTO RESPIMAT) 2.5-2.5 MCG/ACT AERS INHALE 2 PUFFS BY MOUTH INTO THE LUNGS DAILY 12 g 1   vitamin B-12 (CYANOCOBALAMIN) 1000 MCG tablet Take 1,000 mcg by mouth daily.     No current facility-administered medications for this visit.    Allergies:   Sulfa antibiotics    Social History:  The patient  reports that she has never smoked. She has been exposed to tobacco smoke. She has never used smokeless tobacco. She reports that she does not drink alcohol and does not use drugs.   Family History:  The patient's family history includes Brain cancer in her brother; CAD in her father; Cancer in her brother; Diabetes in her brother and father; Diabetes type II in her son; Heart attack in her father; Heart disease in her father and mother; Lymphoma in her sister; Other in her mother; Syncope episode in her mother; Throat cancer in her brother; Ulcers in her mother.    ROS:  Please see the history of present illness.   Otherwise, review of systems are positive for DOE.   All other systems are reviewed and negative.    PHYSICAL EXAM: VS:  There were no vitals taken for this visit. , BMI There is no height or weight on file to calculate BMI. GEN: Well nourished, well developed, in no acute distress HEENT: normal Neck: no JVD, carotid bruits, or masses Cardiac: RRR; no murmurs, rubs, or gallops,no edema  Respiratory:  clear to auscultation bilaterally, normal work of breathing GI: soft, nontender, nondistended, + BS MS: no deformity or atrophy Skin: warm and dry, no rash Neuro:  Strength and sensation are intact Psych: euthymic mood, full affect   EKG:   The ekg ordered today demonstrates atrial fibrillation, nonspecific ST-T wave changes, rate controlled   Recent Labs: 07/24/2022: ALT 19; BUN 15; Creatinine, Ser 0.72; Hemoglobin 10.5; Platelets 96; Potassium 4.5; Sodium 140    Lipid Panel No results found for: "CHOL", "TRIG", "HDL", "CHOLHDL", "VLDL", "LDLCALC", "LDLDIRECT"   Other studies Reviewed: Additional studies/ records that were reviewed today with results demonstrating: Prior echo was reviewed.   ASSESSMENT AND PLAN:  CAD: Recent stress test without evidence of ischemia.  Symptoms of shortness of breath see related to pulmonary hypertension. Pulmonary hypertension/Dyspnea on exertion: Severe pulmonary hypertension with severe tricuspid regurgitation.  Discussed right heart catheterization.  Significant worsening of right sided heart changes from August 2023 echo to April 2024 echo. In speaking to the CHF service: If her PVR is high (>3 WU), then I would send her to [facility] for meds.  If her PVR is low, then diuretics.  We will be getting tricuspid clip going probably in the next 3-6 months.  At 89, not sure she would be a candidate.  We wont have the EVOQUE valve (that [facility] has) for some time. AFib: Rate controlled.  Low-dose Eliquis for stroke prevention.  Will hold Eliquis for 24 hours prior to the right heart cath.  She has a large right antecubital vein which should be able to take sheath for right heart catheterization. Risks and benefits of right heart Were explained to the patient and son and they are willing to proceed to see if there was something that could help reverse her shortness of breath.   Current medicines are reviewed at length with the patient today.  The patient concerns regarding her medicines were addressed.  The following changes have been made:  holding ELiquis for cath  Labs/ tests ordered today include:  No orders of the defined types were placed in this encounter.   Recommend 150 minutes/week of aerobic exercise Low fat, low carb, high fiber diet recommended  Disposition:   FU for right heart cath   Signed, Lance Muss, MD  02/14/2023 10:42 AM    [facility] [address] [place], [place], [place]   [address]  Phone: [phone]; Fax: [phone]

## 2023-02-14 NOTE — Progress Notes (Unsigned)
Cardiology Office Note   Date:  02/14/2023   ID:  [name], DOB Apr 15, 1932, MRN [id number]  PCP:  Charlane Ferretti, DO    No chief complaint on file.  Shortness of breath/CAD  Wt Readings from Last 3 Encounters:  02/08/23 117 lb (53.1 kg)  01/07/23 117 lb 12.8 oz (53.4 kg)  08/03/22 108 lb 12.8 oz (49.4 kg)       History of Present Illness: [name] is a 89 y.o. female  who had CABG in 2007 for left main disease, by Dr. Dorris Fetch. She had hysterectomy in May 2012 and did well.     Her husband passed away many years ago.   She has had some hip pain which has limited her walking and dancing.  Very little relief from the shot she had in her hip.   In Jan 2018, she had a colonoscopy that was ok.     She has bowled in the past and had some DOE with square dancing.  Sx were mild and were managed conservatively.  She has had some atypical chest pains over the years.    She got a puppy to help encourage activity.  She has stopped sleeping pills as well.    Terms of dyspnea have progressed over the past 6 to 8 months.  Most recent echocardiogram in April 2024 showed: "Left ventricular ejection fraction, by estimation, is 55 to 60%. The  left ventricle has normal function. The left ventricle has no regional  wall motion abnormalities. Left ventricular diastolic function could not  be evaluated.   2. Right ventricular systolic function is normal. The right ventricular  size is moderately enlarged. There is severely elevated pulmonary artery  systolic pressure. The estimated right ventricular systolic pressure is  126.1 mmHg.   3. Left atrial size was severely dilated.   4. Right atrial size was moderately dilated.   5. The mitral valve is normal in structure. Mild to moderate mitral valve  regurgitation. No evidence of mitral stenosis.   6. Tricuspid valve leaflets do not appear to coapt completely. Tricuspid  valve regurgitation is severe.   7. The aortic valve is  normal in structure. There is mild calcification  of the aortic valve. There is moderate thickening of the aortic valve.  Aortic valve regurgitation is not visualized. Aortic valve  sclerosis/calcification is present, without any  evidence of aortic stenosis.   8. The inferior vena cava is dilated in size with <50% respiratory  variability, suggesting right atrial pressure of 15 mmHg.   Comparison(s): Changes from prior study are noted. Now PASP severely  elevated (prior 50 mmHg, current 126 mmHg).   Conclusion(s)/Recommendation(s): Severe pulmonary hypertension, severe TR  with noncoaptation of leaflets, moderately dilated RV. "  She continues to bowl.  Team is in the first place in her week.  She has had to give up square dancing due to her shortness of breath.  She has some minimal ankle edema.  She has not had trouble getting shoes on.  Past Medical History:  Diagnosis Date   Chronic atrial fibrillation (HCC)    a. Pt declined cardioversion.   Coronary artery disease    a. s/p CABGx2 06/2006 (LIMA-LAD, SVG-LCx at bifurcation of OM II/III). b. Nuc 11/2012: no evidence of ischemia, attenuation artifact c/w breast attenuation in anterior region, essentially low risk, EF 88%.   Essential hypertension    Hyperlipidemia    Hypothyroidism    Insomnia    on Pajaros,  failed other meds   Iron deficiency anemia    Osteoarthritis    Osteopenia    involved on BMD 06/01/07 (T score-1.6); slightly worse in 3/13 (T score -1.6; FRAX 3%  and 12%)-plan rechcekin 2016   Uterine prolapse    post hysterectomy, Dr. Grewal   UTI (lower urinary tract infection) 3/11    Past Surgical History:  Procedure Laterality Date   ABDOMINAL HYSTERECTOMY     APPENDECTOMY     CATARACT EXTRACTION, BILATERAL  11/02/2007   COLONOSCOPY WITH PROPOFOL N/A 11/30/2016   Procedure: COLONOSCOPY WITH PROPOFOL;  Surgeon: Martin K Johnson, MD;  Location: [facility];  Service: Endoscopy;  Laterality: N/A;   CORONARY ARTERY  BYPASS GRAFT  11/01/2005   ESOPHAGOGASTRODUODENOSCOPY N/A 11/30/2016   Procedure: ESOPHAGOGASTRODUODENOSCOPY (EGD);  Surgeon: Martin K Johnson, MD;  Location: [facility];  Service: Endoscopy;  Laterality: N/A;   LIPOMA EXCISION     OVARIAN CYST REMOVAL     ROTATOR CUFF REPAIR     TEAR DUCT PROBING     X2 on left and x1 on right   TONSILLECTOMY       Current Outpatient Medications  Medication Sig Dispense Refill   alendronate (FOSAMAX) 70 MG tablet Take 70 mg by mouth once a week.      atorvastatin (LIPITOR) 20 MG tablet TAKE 1 TABLET BY MOUTH EVERY DAY 90 tablet 2   Bioflavonoid Products (ESTER-C) TABS Take 1 tablet by mouth daily.     BIOTIN PO Take 1 tablet by mouth daily.     CALCIUM CARBONATE-VIT D-MIN PO Take by mouth daily.     ELIQUIS 2.5 MG TABS tablet TAKE 1 TABLET BY MOUTH TWICE A DAY 180 tablet 1   famotidine (PEPCID) 20 MG tablet famotidine 20 mg tablet  Take 1 tablet twice a day by oral route.     ferrous sulfate 325 (65 FE) MG tablet Take 1 tablet (325 mg total) by mouth daily. (Patient taking differently: Take 325 mg by mouth 3 (three) times a week.)     furosemide (LASIX) 20 MG tablet Take 1 tablet (20 mg total) by mouth 2 (two) times daily. 180 tablet 3   Hypromellose (ARTIFICIAL TEARS OP) Place 1 drop into both eyes every 6 (six) hours as needed (dry eyes).     levothyroxine (SYNTHROID, LEVOTHROID) 75 MCG tablet Take 37.5-75 mcg by mouth daily before breakfast. 1 tablet 6 days a week and 1/2 tab every Friday.     metoprolol succinate (TOPROL-XL) 50 MG 24 hr tablet Take 1 tablet (50 mg total) by mouth daily. 90 tablet 3   Misc Natural Products (OSTEO BI-FLEX ADV JOINT SHIELD PO) Take 1 tablet by mouth 2 (two) times daily.      nitroGLYCERIN (NITROSTAT) 0.4 MG SL tablet Place 1 tablet (0.4 mg total) under the tongue every 5 (five) minutes as needed for chest pain. 25 tablet 0   Omega-3 Fatty Acids (FISH OIL) 1200 MG CAPS Take 1,200 mg by mouth 2 (two) times daily.      senna (SENNA LAX) 8.6 MG tablet Take 1 tablet (8.6 mg total) by mouth daily.     Tiotropium Bromide-Olodaterol (STIOLTO RESPIMAT) 2.5-2.5 MCG/ACT AERS INHALE 2 PUFFS BY MOUTH INTO THE LUNGS DAILY 12 g 1   vitamin B-12 (CYANOCOBALAMIN) 1000 MCG tablet Take 1,000 mcg by mouth daily.     No current facility-administered medications for this visit.    Allergies:   Sulfa antibiotics    Social History:  The patient    reports that she has never smoked. She has been exposed to tobacco smoke. She has never used smokeless tobacco. She reports that she does not drink alcohol and does not use drugs.   Family History:  The patient's family history includes Brain cancer in her brother; CAD in her father; Cancer in her brother; Diabetes in her brother and father; Diabetes type II in her son; Heart attack in her father; Heart disease in her father and mother; Lymphoma in her sister; Other in her mother; Syncope episode in her mother; Throat cancer in her brother; Ulcers in her mother.    ROS:  Please see the history of present illness.   Otherwise, review of systems are positive for DOE.   All other systems are reviewed and negative.    PHYSICAL EXAM: VS:  There were no vitals taken for this visit. , BMI There is no height or weight on file to calculate BMI. GEN: Well nourished, well developed, in no acute distress HEENT: normal Neck: no JVD, carotid bruits, or masses Cardiac: RRR; no murmurs, rubs, or gallops,no edema  Respiratory:  clear to auscultation bilaterally, normal work of breathing GI: soft, nontender, nondistended, + BS MS: no deformity or atrophy Skin: warm and dry, no rash Neuro:  Strength and sensation are intact Psych: euthymic mood, full affect   EKG:   The ekg ordered today demonstrates atrial fibrillation, nonspecific ST-T wave changes, rate controlled   Recent Labs: 07/24/2022: ALT 19; BUN 15; Creatinine, Ser 0.72; Hemoglobin 10.5; Platelets 96; Potassium 4.5; Sodium 140    Lipid Panel No results found for: "CHOL", "TRIG", "HDL", "CHOLHDL", "VLDL", "LDLCALC", "LDLDIRECT"   Other studies Reviewed: Additional studies/ records that were reviewed today with results demonstrating: Prior echo was reviewed.   ASSESSMENT AND PLAN:  CAD: Recent stress test without evidence of ischemia.  Symptoms of shortness of breath see related to pulmonary hypertension. Pulmonary hypertension/Dyspnea on exertion: Severe pulmonary hypertension with severe tricuspid regurgitation.  Discussed right heart catheterization.  Significant worsening of right sided heart changes from August 2023 echo to April 2024 echo. In speaking to the CHF service: If her PVR is high (>3 WU), then I would send her to [facility] for meds.  If her PVR is low, then diuretics.  We will be getting tricuspid clip going probably in the next 3-6 months.  At 89, not sure she would be a candidate.  We wont have the EVOQUE valve (that [facility] has) for some time. AFib: Rate controlled.  Low-dose Eliquis for stroke prevention.  Will hold Eliquis for 24 hours prior to the right heart cath.  She has a large right antecubital vein which should be able to take sheath for right heart catheterization. Risks and benefits of right heart Were explained to the patient and son and they are willing to proceed to see if there was something that could help reverse her shortness of breath.   Current medicines are reviewed at length with the patient today.  The patient concerns regarding her medicines were addressed.  The following changes have been made:  holding ELiquis for cath  Labs/ tests ordered today include:  No orders of the defined types were placed in this encounter.   Recommend 150 minutes/week of aerobic exercise Low fat, low carb, high fiber diet recommended  Disposition:   FU for right heart cath   Signed, Lance Muss, MD  02/14/2023 10:42 AM    [facility] [address] [place], [place], [place]   [address]  Phone: [phone]; Fax: [phone]

## 2023-02-15 ENCOUNTER — Encounter: Payer: Self-pay | Admitting: Interventional Cardiology

## 2023-02-15 ENCOUNTER — Ambulatory Visit: Payer: Medicare PPO | Attending: Interventional Cardiology | Admitting: Interventional Cardiology

## 2023-02-15 ENCOUNTER — Ambulatory Visit: Payer: Medicare PPO | Admitting: Nurse Practitioner

## 2023-02-15 VITALS — BP 124/64 | HR 66 | Ht 62.5 in | Wt 117.0 lb

## 2023-02-15 DIAGNOSIS — I4821 Permanent atrial fibrillation: Secondary | ICD-10-CM

## 2023-02-15 DIAGNOSIS — I25118 Atherosclerotic heart disease of native coronary artery with other forms of angina pectoris: Secondary | ICD-10-CM

## 2023-02-15 DIAGNOSIS — E782 Mixed hyperlipidemia: Secondary | ICD-10-CM | POA: Diagnosis not present

## 2023-02-15 DIAGNOSIS — R0609 Other forms of dyspnea: Secondary | ICD-10-CM | POA: Diagnosis not present

## 2023-02-15 DIAGNOSIS — Z951 Presence of aortocoronary bypass graft: Secondary | ICD-10-CM | POA: Diagnosis not present

## 2023-02-15 DIAGNOSIS — I272 Pulmonary hypertension, unspecified: Secondary | ICD-10-CM | POA: Diagnosis not present

## 2023-02-15 NOTE — Addendum Note (Signed)
Addended by: Lacy Duverney R on: 02/15/2023 05:04 PM   Modules accepted: Orders

## 2023-02-15 NOTE — Patient Instructions (Signed)
Medication Instructions:  Your physician recommends that you continue on your current medications as directed. Please refer to the Current Medication list given to you today.  *If you need a refill on your cardiac medications before your next appointment, please call your pharmacy*   Lab Work: Lab work to be done today--CBC and BMP If you have labs (blood work) drawn today and your tests are completely normal, you will receive your results only by: MyChart Message (if you have MyChart) OR A paper copy in the mail If you have any lab test that is abnormal or we need to change your treatment, we will call you to review the results.   Testing/Procedures: Your physician has requested that you have a cardiac catheterization. Cardiac catheterization is used to diagnose and/or treat various heart conditions. Doctors may recommend this procedure for a number of different reasons. The most common reason is to evaluate chest pain. Chest pain can be a symptom of coronary artery disease (CAD), and cardiac catheterization can show whether plaque is narrowing or blocking your heart's arteries. This procedure is also used to evaluate the valves, as well as measure the blood flow and oxygen levels in different parts of your heart. For further information please visit [email or web address] Please follow instruction sheet, as given.    Follow-Up: At [facility], you and your health needs are our priority.  As part of our continuing mission to provide you with exceptional heart care, we have created designated Provider Care Teams.  These Care Teams include your primary Cardiologist (physician) and Advanced Practice Providers (APPs -  Physician Assistants and Nurse Practitioners) who all work together to provide you with the care you need, when you need it.  We recommend signing up for the patient portal called "MyChart".  Sign up information is provided on this After Visit Summary.  MyChart is used to  connect with patients for Virtual Visits (Telemedicine).  Patients are able to view lab/test results, encounter notes, upcoming appointments, etc.  Non-urgent messages can be sent to your provider as well.   To learn more about what you can do with MyChart, go to [email or web address].    Your next appointment:   To be arranged after procedure  Provider:   Lance Muss, MD or APP    Other Instructions  [facility] A DEPT OF [place] AT [facility] [address] [place], [place] [address] [id number] [facility] [place] [address] Dept: [phone] Loc: [phone]  [name]  02/15/2023  You are scheduled for a Cardiac Catheterization on Thursday, April 18 with Dr. Lance Muss.  1. Please arrive at the [facility] (Main Entrance A) at [facility]: [address] [place], [place] [address] at 8:30 AM (This time is two hours before your procedure to ensure your preparation). Free valet parking service is available. You will check in at ADMITTING. The support person will be asked to wait in the waiting room.  It is OK to have someone drop you off and come back when you are ready to be discharged.    Special note: Every effort is made to have your procedure done on time. Please understand that emergencies sometimes delay scheduled procedures.  2. Diet: Do not eat solid foods after midnight.  The patient may have clear liquids until 5am upon the day of the procedure.  3. Labs: done in office today  4. Medication instructions in preparation for your procedure: Stop Eliquis after evening dose  today. Do not take furosemide the morning of the procedure   Contrast Allergy: No   On the morning of your procedure, take your Aspirin 81 mg and any morning medicines NOT listed above.  You may use sips of water.  5. Plan to go home the same day, you will only stay overnight if medically necessary. 6. Bring a current list of  your medications and current insurance cards. 7. You MUST have a responsible person to drive you home. 8. Someone MUST be with you the first 24 hours after you arrive home or your discharge will be delayed. 9. Please wear clothes that are easy to get on and off and wear slip-on shoes.  Thank you for allowing [place] to care for you!   -- [place] [facility]

## 2023-02-16 ENCOUNTER — Telehealth: Payer: Self-pay | Admitting: *Deleted

## 2023-02-16 NOTE — Telephone Encounter (Signed)
Cardiac Catheterization scheduled at [facility] for: Thursday February 17, 2023 10:30 AM Arrival time [facility] Main Entrance A at: 8:30 AM  Nothing to eat after midnight prior to procedure, clear liquids until 5 AM day of procedure.  Medication instructions: -Hold:  Eliquis-none 02/16/23 until post procedure  Lasix-AM of procedure -Other usual morning medications can be taken with sips of water.  Confirmed patient has responsible adult to drive home post procedure and be with patient first 24 hours after arriving home.  Plan to go home the same day, you will only stay overnight if medically necessary.  Reviewed procedure instructions with patient.

## 2023-02-17 ENCOUNTER — Ambulatory Visit (HOSPITAL_COMMUNITY)
Admission: RE | Admit: 2023-02-17 | Discharge: 2023-02-17 | Disposition: A | Discharge status: Home / Self Care | Payer: Medicare PPO | Attending: Interventional Cardiology | Admitting: Interventional Cardiology

## 2023-02-17 ENCOUNTER — Encounter (HOSPITAL_COMMUNITY)
Admission: RE | Disposition: A | Discharge status: Home / Self Care | Payer: Self-pay | Source: Home / Self Care | Attending: Interventional Cardiology

## 2023-02-17 ENCOUNTER — Other Ambulatory Visit: Payer: Self-pay

## 2023-02-17 DIAGNOSIS — R0609 Other forms of dyspnea: Secondary | ICD-10-CM | POA: Diagnosis not present

## 2023-02-17 DIAGNOSIS — Z7901 Long term (current) use of anticoagulants: Secondary | ICD-10-CM | POA: Insufficient documentation

## 2023-02-17 DIAGNOSIS — Z951 Presence of aortocoronary bypass graft: Secondary | ICD-10-CM | POA: Diagnosis not present

## 2023-02-17 DIAGNOSIS — I071 Rheumatic tricuspid insufficiency: Secondary | ICD-10-CM | POA: Insufficient documentation

## 2023-02-17 DIAGNOSIS — I272 Pulmonary hypertension, unspecified: Secondary | ICD-10-CM

## 2023-02-17 DIAGNOSIS — I482 Chronic atrial fibrillation, unspecified: Secondary | ICD-10-CM | POA: Diagnosis not present

## 2023-02-17 DIAGNOSIS — I2510 Atherosclerotic heart disease of native coronary artery without angina pectoris: Secondary | ICD-10-CM | POA: Insufficient documentation

## 2023-02-17 HISTORY — PX: RIGHT HEART CATH: CATH118263

## 2023-02-17 LAB — BASIC METABOLIC PANEL
BUN/Creatinine Ratio: 24 (ref 12–28)
BUN: 18 mg/dL (ref 8–27)
CO2: 20 mmol/L (ref 20–29)
Calcium: 9.7 mg/dL (ref 8.7–10.3)
Chloride: 106 mmol/L (ref 96–106)
Creatinine, Ser: 0.75 mg/dL (ref 0.57–1.00)
Glucose: 106 mg/dL — ABNORMAL HIGH (ref 70–99)
Potassium: 4 mmol/L (ref 3.5–5.2)
Sodium: 144 mmol/L (ref 134–144)
eGFR: 77 mL/min/{1.73_m2} (ref >59)

## 2023-02-17 LAB — POCT I-STAT EG7
Acid-base deficit: 2 mmol/L (ref 0.0–2.0)
Acid-base deficit: 2 mmol/L (ref 0.0–2.0)
Bicarbonate: 22.5 mmol/L (ref 20.0–28.0)
Bicarbonate: 22.5 mmol/L (ref 20.0–28.0)
Calcium, Ion: 1.28 mmol/L (ref 1.15–1.40)
Calcium, Ion: 1.29 mmol/L (ref 1.15–1.40)
HCT: 32 % — ABNORMAL LOW (ref 36.0–46.0)
HCT: 33 % — ABNORMAL LOW (ref 36.0–46.0)
Hemoglobin: 10.9 g/dL — ABNORMAL LOW (ref 12.0–15.0)
Hemoglobin: 11.2 g/dL — ABNORMAL LOW (ref 12.0–15.0)
O2 Saturation: 63 %
O2 Saturation: 66 %
Potassium: 4 mmol/L (ref 3.5–5.1)
Potassium: 4 mmol/L (ref 3.5–5.1)
Sodium: 140 mmol/L (ref 135–145)
Sodium: 141 mmol/L (ref 135–145)
TCO2: 24 mmol/L (ref 22–32)
TCO2: 24 mmol/L (ref 22–32)
pCO2, Ven: 36.8 mmHg — ABNORMAL LOW (ref 44–60)
pCO2, Ven: 37.1 mmHg — ABNORMAL LOW (ref 44–60)
pH, Ven: 7.391 (ref 7.25–7.43)
pH, Ven: 7.394 (ref 7.25–7.43)
pO2, Ven: 33 mmHg (ref 32–45)
pO2, Ven: 34 mmHg (ref 32–45)

## 2023-02-17 LAB — CBC
Hematocrit: 32.8 % — ABNORMAL LOW (ref 34.0–46.6)
Hemoglobin: 10.4 g/dL — ABNORMAL LOW (ref 11.1–15.9)
MCH: 29.2 pg (ref 26.6–33.0)
MCHC: 31.7 g/dL (ref 31.5–35.7)
MCV: 92 fL (ref 79–97)
Platelets: 92 10*3/uL — CL (ref 150–450)
RBC: 3.56 x10E6/uL — ABNORMAL LOW (ref 3.77–5.28)
RDW: 13.3 % (ref 11.7–15.4)
WBC: 4.5 10*3/uL (ref 3.4–10.8)

## 2023-02-17 SURGERY — RIGHT HEART CATH

## 2023-02-17 MED ORDER — MIDAZOLAM HCL 2 MG/2ML IJ SOLN
INTRAMUSCULAR | Status: AC
  Filled 2023-02-17: qty 2

## 2023-02-17 MED ORDER — SODIUM CHLORIDE 0.9 % IV SOLN: INTRAVENOUS | Status: DC

## 2023-02-17 MED ORDER — FENTANYL CITRATE (PF) 100 MCG/2ML IJ SOLN
INTRAMUSCULAR | Status: AC
  Filled 2023-02-17: qty 2

## 2023-02-17 MED ORDER — SODIUM CHLORIDE 0.9% FLUSH: 3.0000 mL | Freq: Two times a day (BID) | INTRAVENOUS | Status: DC

## 2023-02-17 MED ORDER — LIDOCAINE HCL (PF) 1 % IJ SOLN
INTRAMUSCULAR | Status: AC
  Filled 2023-02-17: qty 30

## 2023-02-17 MED ORDER — ONDANSETRON HCL 4 MG/2ML IJ SOLN: 4.0000 mg | Freq: Four times a day (QID) | INTRAMUSCULAR | Status: DC | PRN

## 2023-02-17 MED ORDER — HEPARIN (PORCINE) IN NACL 1000-0.9 UT/500ML-% IV SOLN
INTRAVENOUS | Status: DC | PRN
  Administered 2023-02-17: 500 mL

## 2023-02-17 MED ORDER — SODIUM CHLORIDE 0.9% FLUSH: 3.0000 mL | INTRAVENOUS | Status: DC | PRN

## 2023-02-17 MED ORDER — SODIUM CHLORIDE 0.9 % WEIGHT BASED INFUSION: 1.0000 mL/kg/h | INTRAVENOUS | Status: DC

## 2023-02-17 MED ORDER — ACETAMINOPHEN 325 MG PO TABS: 650.0000 mg | ORAL_TABLET | ORAL | Status: DC | PRN

## 2023-02-17 MED ORDER — SODIUM CHLORIDE 0.9 % IV SOLN: 250.0000 mL | INTRAVENOUS | Status: DC | PRN

## 2023-02-17 MED ORDER — SODIUM CHLORIDE 0.9 % WEIGHT BASED INFUSION
3.0000 mL/kg/h | INTRAVENOUS | Status: DC
  Administered 2023-02-17: 3 mL/kg/h via INTRAVENOUS

## 2023-02-17 MED ORDER — LABETALOL HCL 5 MG/ML IV SOLN: 10.0000 mg | INTRAVENOUS | Status: DC | PRN

## 2023-02-17 MED ORDER — LIDOCAINE HCL (PF) 1 % IJ SOLN
INTRAMUSCULAR | Status: DC | PRN
  Administered 2023-02-17: 5 mL via INTRADERMAL

## 2023-02-17 MED ORDER — HYDRALAZINE HCL 20 MG/ML IJ SOLN: 10.0000 mg | INTRAMUSCULAR | Status: DC | PRN

## 2023-02-17 SURGICAL SUPPLY — 4 items
CATH BALLN WEDGE 5F 110CM (CATHETERS) IMPLANT
PACK CARDIAC CATHETERIZATION (CUSTOM PROCEDURE TRAY) IMPLANT
SHEATH GLIDE SLENDER 4/5FR (SHEATH) IMPLANT
WIRE EMERALD 3MM-J .025X260CM (WIRE) IMPLANT

## 2023-02-17 NOTE — Discharge Instructions (Signed)

## 2023-02-17 NOTE — Interval H&P Note (Signed)
History and Physical Interval Note:  02/17/2023 11:19 AM  [name]  has presented today for surgery, with the diagnosis of hp.  The various methods of treatment have been discussed with the patient and family. After consideration of risks, benefits and other options for treatment, the patient has consented to  Procedure(s): RIGHT HEART CATH (N/A) as a surgical intervention.  The patient's history has been reviewed, patient examined, no change in status, stable for surgery.  I have reviewed the patient's chart and labs.  Questions were answered to the patient's satisfaction.    Right heart only [facility]

## 2023-02-17 NOTE — Progress Notes (Signed)
Patient and son was given discharge instructions. Both verbalized understanding. 

## 2023-02-18 ENCOUNTER — Telehealth: Payer: Self-pay | Admitting: Interventional Cardiology

## 2023-02-18 ENCOUNTER — Encounter (HOSPITAL_COMMUNITY): Payer: Self-pay | Admitting: Interventional Cardiology

## 2023-02-18 NOTE — Telephone Encounter (Signed)
Pt can't remember when she needs to resume her medications, post catherization. Please advise.

## 2023-02-18 NOTE — Telephone Encounter (Signed)
Spoke with patient to review post cath instructions and when to restart medications.  Patient verbalized understanding and had no questions.

## 2023-02-23 ENCOUNTER — Telehealth: Payer: Self-pay | Admitting: Interventional Cardiology

## 2023-02-23 NOTE — Telephone Encounter (Signed)
Pt had RHC on 02/17/23 and she wants to finish out her bowling on Monday 02/28/23 and she says that her wrist has healed well and she is feeling well.. I told her that I believe that by then will be out of any post cath restrictions but will forward to Dr Hoyle Barr nurse to be sure.

## 2023-02-23 NOTE — Telephone Encounter (Signed)
Patient calling with some question and concerns. Please advise

## 2023-02-24 NOTE — Telephone Encounter (Signed)
OK to bowl

## 2023-02-25 ENCOUNTER — Other Ambulatory Visit (HOSPITAL_COMMUNITY): Payer: Self-pay

## 2023-02-25 ENCOUNTER — Telehealth (HOSPITAL_COMMUNITY): Payer: Self-pay

## 2023-02-25 ENCOUNTER — Ambulatory Visit (HOSPITAL_COMMUNITY)
Admission: RE | Admit: 2023-02-25 | Discharge: 2023-02-25 | Disposition: A | Discharge status: Home / Self Care | Payer: Medicare PPO | Source: Ambulatory Visit | Attending: Cardiology | Admitting: Cardiology

## 2023-02-25 VITALS — BP 102/70 | HR 82 | Wt 110.0 lb

## 2023-02-25 DIAGNOSIS — I509 Heart failure, unspecified: Secondary | ICD-10-CM | POA: Insufficient documentation

## 2023-02-25 DIAGNOSIS — I2510 Atherosclerotic heart disease of native coronary artery without angina pectoris: Secondary | ICD-10-CM | POA: Diagnosis not present

## 2023-02-25 DIAGNOSIS — E785 Hyperlipidemia, unspecified: Secondary | ICD-10-CM | POA: Diagnosis not present

## 2023-02-25 DIAGNOSIS — I272 Pulmonary hypertension, unspecified: Secondary | ICD-10-CM | POA: Insufficient documentation

## 2023-02-25 DIAGNOSIS — I110 Hypertensive heart disease with heart failure: Secondary | ICD-10-CM | POA: Insufficient documentation

## 2023-02-25 DIAGNOSIS — Z7901 Long term (current) use of anticoagulants: Secondary | ICD-10-CM | POA: Insufficient documentation

## 2023-02-25 DIAGNOSIS — Z79899 Other long term (current) drug therapy: Secondary | ICD-10-CM | POA: Diagnosis not present

## 2023-02-25 DIAGNOSIS — Z951 Presence of aortocoronary bypass graft: Secondary | ICD-10-CM | POA: Insufficient documentation

## 2023-02-25 DIAGNOSIS — I519 Heart disease, unspecified: Secondary | ICD-10-CM | POA: Diagnosis not present

## 2023-02-25 DIAGNOSIS — Z90710 Acquired absence of both cervix and uterus: Secondary | ICD-10-CM | POA: Diagnosis not present

## 2023-02-25 DIAGNOSIS — J449 Chronic obstructive pulmonary disease, unspecified: Secondary | ICD-10-CM | POA: Diagnosis not present

## 2023-02-25 DIAGNOSIS — I4821 Permanent atrial fibrillation: Secondary | ICD-10-CM | POA: Diagnosis not present

## 2023-02-25 DIAGNOSIS — Z7722 Contact with and (suspected) exposure to environmental tobacco smoke (acute) (chronic): Secondary | ICD-10-CM | POA: Diagnosis not present

## 2023-02-25 DIAGNOSIS — I4819 Other persistent atrial fibrillation: Secondary | ICD-10-CM | POA: Insufficient documentation

## 2023-02-25 DIAGNOSIS — I071 Rheumatic tricuspid insufficiency: Secondary | ICD-10-CM | POA: Diagnosis not present

## 2023-02-25 MED ORDER — DIGOXIN 125 MCG PO TABS: 0.0625 mg | ORAL_TABLET | Freq: Every day | ORAL | 3 refills | Status: DC

## 2023-02-25 MED ORDER — SILDENAFIL CITRATE 20 MG PO TABS: 20.0000 mg | ORAL_TABLET | Freq: Three times a day (TID) | ORAL | 3 refills | Status: DC

## 2023-02-25 MED ORDER — BISOPROLOL FUMARATE 5 MG PO TABS: 5.0000 mg | ORAL_TABLET | Freq: Every day | ORAL | 3 refills | Status: DC

## 2023-02-25 NOTE — Patient Instructions (Signed)
STOP Metoprolol   START Bisoprolol 5 mg daily.  START Sildenafil 20 mg Three times a day  START Digoxin .625 mcg ( 1/2 Tab ) daily.  CHANGE Lasix 40 mg in the morning and 20 mg in the evening for 3 days then go back to your normal dose.  Your physician has requested that you have cardiac CT. Cardiac computed tomography (CT) is a painless test that uses an x-ray machine to take clear, detailed pictures of your heart. For further information please visit [email or web address] Please follow instruction sheet as given.  ONCE APPROVED BY YOUR INSURANCE YOU WILL BE CALLED TO ARRANGE THE TEST.  Your physician recommends that you schedule a follow-up appointment in: 1 month  If you have any questions or concerns before your next appointment please send [place] a message through [place] or call our office at [phone].    TO LEAVE A MESSAGE FOR THE NURSE SELECT OPTION 2, PLEASE LEAVE A MESSAGE INCLUDING: YOUR NAME DATE OF BIRTH CALL BACK NUMBER REASON FOR CALL**this is important as we prioritize the call backs  YOU WILL RECEIVE A CALL BACK THE SAME DAY AS LONG AS YOU CALL BEFORE 4:00 PM  At the [facility], you and your health needs are our priority. As part of our continuing mission to provide you with exceptional heart care, we have created designated Provider Care Teams. These Care Teams include your primary Cardiologist (physician) and Advanced Practice Providers (APPs- Physician Assistants and Nurse Practitioners) who all work together to provide you with the care you need, when you need it.   You may see any of the following providers on your designated Care Team at your next follow up: Dr Arvilla Meres Dr Marca Ancona Dr. Marcos Eke, NP Robbie Lis, [place] [facility] [place], [place] Brynda Peon, NP Karle Plumber, PharmD   Please be sure to bring in all your medications bottles to every appointment.    Thank you for choosing [facility]

## 2023-02-25 NOTE — Progress Notes (Signed)
ReDS Vest / Clip - 02/25/23 1128       ReDS Vest / Clip   Station Marker A    Ruler Value 25    ReDS Value Range Moderate volume overload    ReDS Actual Value 39              6 Min Walk Test Completed  Pt ambulated 228.6 meters O2 Sat ranged 99 on 96 on room air HR ranged 77-86  She only stopped due to knee and hip pain

## 2023-02-25 NOTE — Telephone Encounter (Signed)
Patient Advocate Encounter  Prior authorization required for Sildenafil CMM [id number] Submitted 02/25/2023  Burnell Blanks, CPhT Rx Patient Advocate Phone: [phone]

## 2023-02-25 NOTE — Progress Notes (Signed)
[facility]IN[facility] NOTE  Referring Physician: Charlane Ferretti, DO  Primary Care: Charlane Ferretti, DO Primary Cardiologist: [name]  HPI: [name] is a 89 y.o. female with history of CABG in 2007 for left main disease by Dr. Dorris Fetch, persistent atrial fibrillation, hypertension, hyperlipidemia and history of hysterectomy in 2012 and recently diagnosed pulmonary hypertension and severe tricuspid regurgitation presenting today to establish care.  Despite her age, [name] has remained very active.  She continues to go bowling and square dancing however now reports becoming increasingly limited due to exertional dyspnea.  Due to shortness of breath, patient had an echocardiogram that demonstrated severe tricuspid regurgitation with a severely dilated right atrium and severely elevated PA systolic pressures.  She had a right heart cath which further confirmed a PVR of 3-4 Wood units.  Today she presents to establish care.  Interval history Since her right heart cath [name] reports feeling fairly well.  She becomes dyspneic when going on walks with her son and is becoming frustrated by this progressively worsening dyspnea.  No lightheadedness, lower extremity edema or PND reported.  Her chest pain is improved significantly without intervention.  At this time she is interested in doing what ever it takes to help improve her symptoms of shortness of breath with activity.  She would like to once again continue to participate in bowling and square dancing.  Activity level/exercise tolerance: NYHA II-III Orthopnea:  Sleeps on 2 pillows Paroxysmal noctural dyspnea: No Chest pain/pressure: Yes but infrequent Orthostatic lightheadedness: No Palpitations: No Lower extremity edema: No Presyncope/syncope: No Cough: No  Past Medical History:  Diagnosis Date   Chronic atrial fibrillation (HCC)    a. Pt declined cardioversion.   Coronary artery disease    a. s/p CABGx2 06/2006  (LIMA-LAD, SVG-LCx at bifurcation of OM II/III). b. Nuc 11/2012: no evidence of ischemia, attenuation artifact c/w breast attenuation in anterior region, essentially low risk, EF 88%.   Essential hypertension    Hyperlipidemia    Hypothyroidism    Insomnia    on ambien, failed other meds   Iron deficiency anemia    Osteoarthritis    Osteopenia    involved on BMD 06/01/07 (T score-1.6); slightly worse in 3/13 (T score -1.6; FRAX 3%  and 12%)-plan rechcekin 2016   Uterine prolapse    post hysterectomy, Dr. Vincente Poli   UTI (lower urinary tract infection) 3/11    Current Outpatient Medications  Medication Sig Dispense Refill   acetaminophen (TYLENOL) 500 MG tablet Take 500 mg by mouth daily. Additional 500 mg if needed during the day     alendronate (FOSAMAX) 70 MG tablet Take 70 mg by mouth once a week.      atorvastatin (LIPITOR) 20 MG tablet TAKE 1 TABLET BY MOUTH EVERY DAY 90 tablet 2   Bioflavonoid Products (ESTER-C) TABS Take 1 tablet by mouth daily.     BIOTIN PO Take 10,000 mg by mouth daily.     CALCIUM CARBONATE-VIT D-MIN PO Take 300 mg by mouth daily.     ELIQUIS 2.5 MG TABS tablet TAKE 1 TABLET BY MOUTH TWICE A DAY 180 tablet 1   famotidine (PEPCID) 20 MG tablet Take 20 mg by mouth at bedtime.     ferrous sulfate 325 (65 FE) MG tablet Take 1 tablet (325 mg total) by mouth daily. (Patient taking differently: Take 325 mg by mouth 3 (three) times a week.)     furosemide (LASIX) 20 MG tablet Take 1 tablet (20  mg total) by mouth 2 (two) times daily. 180 tablet 3   Hypromellose (ARTIFICIAL TEARS OP) Place 1 drop into both eyes every 6 (six) hours as needed (dry eyes).     levothyroxine (SYNTHROID, LEVOTHROID) 75 MCG tablet Take 37.5-75 mcg by mouth See admin instructions. Take 75 mg  6 days a week and 37.5 mg every Friday in the morning before breakfast     metoprolol succinate (TOPROL-XL) 50 MG 24 hr tablet Take 1 tablet (50 mg total) by mouth daily. 90 tablet 3   Misc Natural Products  (OSTEO BI-FLEX ADV JOINT SHIELD PO) Take 1 tablet by mouth 2 (two) times daily.      nitroGLYCERIN (NITROSTAT) 0.4 MG SL tablet Place 1 tablet (0.4 mg total) under the tongue every 5 (five) minutes as needed for chest pain. 25 tablet 0   Omega-3 Fatty Acids (FISH OIL) 1200 MG CAPS Take 1,200 mg by mouth 2 (two) times daily.     senna (SENNA LAX) 8.6 MG tablet Take 1 tablet (8.6 mg total) by mouth daily. (Patient taking differently: Take 1 tablet by mouth daily as needed for constipation.)     Tiotropium Bromide-Olodaterol (STIOLTO RESPIMAT) 2.5-2.5 MCG/ACT AERS INHALE 2 PUFFS BY MOUTH INTO THE LUNGS DAILY 12 g 1   vitamin B-12 (CYANOCOBALAMIN) 1000 MCG tablet Take 1,000 mcg by mouth daily.     zolpidem (AMBIEN) 5 MG tablet Take 5 mg by mouth at bedtime.     No current facility-administered medications for this encounter.    Allergies  Allergen Reactions   Sulfa Antibiotics Rash      Social History   Socioeconomic History   Marital status: Married    Spouse name: Not on file   Number of children: Not on file   Years of education: Not on file   Highest education level: Not on file  Occupational History   Occupation: retired  Tobacco Use   Smoking status: Never    Passive exposure: Past   Smokeless tobacco: Never  Vaping Use   Vaping Use: Never used  Substance and Sexual Activity   Alcohol use: No    Alcohol/week: 0.0 standard drinks of alcohol   Drug use: No   Sexual activity: Not Currently  Other Topics Concern   Not on file  Social History Narrative   Not on file   Social Determinants of Health   Financial Resource Strain: Not on file  Food Insecurity: Not on file  Transportation Needs: Not on file  Physical Activity: Not on file  Stress: Not on file  Social Connections: Not on file  Intimate Partner Violence: Not on file      Family History  Problem Relation Age of Onset   Other Mother    Heart disease Mother    Syncope episode Mother    Ulcers Mother     Heart disease Father    Heart attack Father    CAD Father    Diabetes Father    Lymphoma Sister    Cancer Brother        throat   Brain cancer Brother    Throat cancer Brother    Diabetes Brother    Diabetes type II Son    Colon cancer Neg Hx    Esophageal cancer Neg Hx    Stomach cancer Neg Hx    Pancreatic cancer Neg Hx     PHYSICAL EXAM: Vitals:   02/25/23 1128  BP: 102/70  Pulse: 82  SpO2: 99%  GENERAL: Thin elderly female in no apparent distress HEENT: Negative for arcus senilis or xanthelasma. There is no scleral icterus.  The mucous membranes are pink and moist.   NECK: Supple, No masses. Normal carotid upstrokes without bruits. No masses or thyromegaly.    CHEST: There are no chest wall deformities. There is no chest wall tenderness. Respirations are unlabored.  Lungs-CTA bilaterally CARDIAC:  JVP: 12 cm H2O         Normal S1, S2  Normal rate with regular rhythm. No murmurs, rubs or gallops.  Pulses are 2+ and symmetrical in upper and lower extremities.  1+ edema.  ABDOMEN: Soft, non-tender, non-distended. There are no masses or hepatomegaly. There are normal bowel sounds.  EXTREMITIES: Warm and well perfused with no cyanosis, clubbing.  LYMPHATIC: No axillary or supraclavicular lymphadenopathy.  NEUROLOGIC: Patient is oriented x3 with no focal or lateralizing neurologic deficits.  PSYCH: Patients affect is appropriate, there is no evidence of anxiety or depression.  SKIN: Warm and dry; no lesions or wounds.   DATA REVIEW  ECG: Atrial fibrillation  As per my personal interpretation  ECHO: 02/08/23 LVEF 55%, mildly reduced RV, severely dilated RA. severe TR, moderate MR,  As per my personal interpretation  CATH: 02/17/23:  Hemodynamic findings consistent with mild to moderate pulmonary hypertension.   Aortic saturation 98% noninvasively.  PA saturation 65%.  RA pressure 20/30, mean RA pressure 18 mmHg, RV pressure 58/3, PA pressure 57/19, mean PA pressure 34  mmHg, pulmonary capillary wedge pressure 18/29, mean pulmonary capillary wedge pressure 19.  Cardiac output 4.3 L/min, cardiac index 2.85, calculated PVR 3.48.   prominent V waves noted on the wedge tracing.   RA waveform suggestive of significant tricuspid regurgitation.   02/25/23: Pt ambulated 228.6 meters O2 Sat ranged 99 on 96 on room air HR ranged 77-86    ASSESSMENT & PLAN:  Pulmonary Hypertension, severe tricuspid regurgitation, RV dysfunction -Echocardiogram demonstrates severely dilated right atrium with severe torrential tricuspid regurgitation due to poor tricuspid leaflet coaptation.  She has had a right heart cath with a PVR of 3.5 Wood units, RA pressure of 20-30 with large V waves and a PA mean of 34 mmHg. -Reds today 39%. -For further evaluation of pulmonary hypertension will obtain lab work including ANA and CT chest.  She does have PFTs from 2017 that demonstrate obstructive lung disease that is at least moderate from secondhand smoking exposure.  6-minute walk test today without significant desaturations.  I had a lengthy discussion with her today regarding various medication combinations that we can try to improve her functional status.  At this time we will start sildenafil 20 mg 3 times daily with digoxin 0.0625 mcg daily.  I -Increase Lasix to 40 mg in the morning and 20 mg in the evening -Discontinue metoprolol due to significant obstructive lung disease.  Start bisoprolol 5 mg daily  2.  Persistent atrial fibrillation -Continue bisoprolol - may benefit from cardioversion in the future. -apixaban 2.5mg  BID  3. CAD s/p CABG - followed by Dr. Ardean Larsen Advanced [facility] Failure Mechanical Circulatory Support

## 2023-02-28 ENCOUNTER — Other Ambulatory Visit (HOSPITAL_COMMUNITY): Payer: Self-pay

## 2023-02-28 NOTE — Telephone Encounter (Signed)
Patient Advocate Encounter  Prior authorization for Sildenafil has been submitted and approved. Test billing returns $24 for 67 day supply.  Key: [id number] Effective: 11/01/2022 to 11/01/2023  Burnell Blanks, CPhT Rx Patient Advocate Phone: [phone]

## 2023-03-02 ENCOUNTER — Telehealth: Payer: Self-pay | Admitting: *Deleted

## 2023-03-03 ENCOUNTER — Encounter (HOSPITAL_COMMUNITY): Payer: Self-pay

## 2023-03-03 ENCOUNTER — Telehealth (HOSPITAL_COMMUNITY): Payer: Self-pay

## 2023-03-03 NOTE — Telephone Encounter (Signed)
Patient called and stated someone scheduled her for a CT and she cannot do on that date.  I do not see any notes in system about who called her, was not sure if it was you.

## 2023-03-08 DIAGNOSIS — H04123 Dry eye syndrome of bilateral lacrimal glands: Secondary | ICD-10-CM | POA: Diagnosis not present

## 2023-03-08 DIAGNOSIS — H0100B Unspecified blepharitis left eye, upper and lower eyelids: Secondary | ICD-10-CM | POA: Diagnosis not present

## 2023-03-08 DIAGNOSIS — H52203 Unspecified astigmatism, bilateral: Secondary | ICD-10-CM | POA: Diagnosis not present

## 2023-03-08 DIAGNOSIS — H524 Presbyopia: Secondary | ICD-10-CM | POA: Diagnosis not present

## 2023-03-08 DIAGNOSIS — H26491 Other secondary cataract, right eye: Secondary | ICD-10-CM | POA: Diagnosis not present

## 2023-03-08 DIAGNOSIS — H0100A Unspecified blepharitis right eye, upper and lower eyelids: Secondary | ICD-10-CM | POA: Diagnosis not present

## 2023-03-08 DIAGNOSIS — H43813 Vitreous degeneration, bilateral: Secondary | ICD-10-CM | POA: Diagnosis not present

## 2023-03-08 DIAGNOSIS — H11823 Conjunctivochalasis, bilateral: Secondary | ICD-10-CM | POA: Diagnosis not present

## 2023-03-24 ENCOUNTER — Ambulatory Visit (HOSPITAL_COMMUNITY)
Admission: RE | Admit: 2023-03-24 | Discharge: 2023-03-24 | Disposition: A | Discharge status: Home / Self Care | Payer: Medicare PPO | Source: Ambulatory Visit | Attending: Cardiology | Admitting: Cardiology

## 2023-03-24 ENCOUNTER — Ambulatory Visit (HOSPITAL_COMMUNITY): Payer: Medicare PPO

## 2023-03-24 DIAGNOSIS — J9811 Atelectasis: Secondary | ICD-10-CM | POA: Diagnosis not present

## 2023-03-24 DIAGNOSIS — I700 Atherosclerosis of aorta: Secondary | ICD-10-CM | POA: Diagnosis not present

## 2023-03-24 DIAGNOSIS — I272 Pulmonary hypertension, unspecified: Secondary | ICD-10-CM | POA: Diagnosis not present

## 2023-03-24 DIAGNOSIS — J90 Pleural effusion, not elsewhere classified: Secondary | ICD-10-CM | POA: Diagnosis not present

## 2023-03-25 ENCOUNTER — Encounter (HOSPITAL_COMMUNITY): Payer: Self-pay | Admitting: Cardiology

## 2023-03-25 ENCOUNTER — Ambulatory Visit (HOSPITAL_COMMUNITY)
Admission: RE | Admit: 2023-03-25 | Discharge: 2023-03-25 | Disposition: A | Discharge status: Home / Self Care | Payer: Medicare PPO | Source: Ambulatory Visit | Attending: Cardiology | Admitting: Cardiology

## 2023-03-25 VITALS — BP 110/58 | HR 50 | Wt 111.4 lb

## 2023-03-25 DIAGNOSIS — Z79899 Other long term (current) drug therapy: Secondary | ICD-10-CM | POA: Diagnosis not present

## 2023-03-25 DIAGNOSIS — I4821 Permanent atrial fibrillation: Secondary | ICD-10-CM

## 2023-03-25 DIAGNOSIS — I071 Rheumatic tricuspid insufficiency: Secondary | ICD-10-CM | POA: Insufficient documentation

## 2023-03-25 DIAGNOSIS — Z7722 Contact with and (suspected) exposure to environmental tobacco smoke (acute) (chronic): Secondary | ICD-10-CM | POA: Diagnosis not present

## 2023-03-25 DIAGNOSIS — I2510 Atherosclerotic heart disease of native coronary artery without angina pectoris: Secondary | ICD-10-CM | POA: Insufficient documentation

## 2023-03-25 DIAGNOSIS — J449 Chronic obstructive pulmonary disease, unspecified: Secondary | ICD-10-CM | POA: Insufficient documentation

## 2023-03-25 DIAGNOSIS — Z7901 Long term (current) use of anticoagulants: Secondary | ICD-10-CM | POA: Insufficient documentation

## 2023-03-25 DIAGNOSIS — I4819 Other persistent atrial fibrillation: Secondary | ICD-10-CM | POA: Diagnosis not present

## 2023-03-25 DIAGNOSIS — I509 Heart failure, unspecified: Secondary | ICD-10-CM | POA: Diagnosis not present

## 2023-03-25 DIAGNOSIS — Z90710 Acquired absence of both cervix and uterus: Secondary | ICD-10-CM | POA: Insufficient documentation

## 2023-03-25 DIAGNOSIS — I110 Hypertensive heart disease with heart failure: Secondary | ICD-10-CM | POA: Diagnosis not present

## 2023-03-25 DIAGNOSIS — I272 Pulmonary hypertension, unspecified: Secondary | ICD-10-CM | POA: Insufficient documentation

## 2023-03-25 DIAGNOSIS — E785 Hyperlipidemia, unspecified: Secondary | ICD-10-CM | POA: Insufficient documentation

## 2023-03-25 DIAGNOSIS — Z951 Presence of aortocoronary bypass graft: Secondary | ICD-10-CM | POA: Diagnosis not present

## 2023-03-25 LAB — DIGOXIN LEVEL: Digoxin Level: 0.6 ng/mL — ABNORMAL LOW (ref 0.8–2.0)

## 2023-03-25 LAB — BASIC METABOLIC PANEL
Anion gap: 10 (ref 5–15)
BUN: 21 mg/dL (ref 8–23)
CO2: 24 mmol/L (ref 22–32)
Calcium: 9.5 mg/dL (ref 8.9–10.3)
Chloride: 105 mmol/L (ref 98–111)
Creatinine, Ser: 1.05 mg/dL — ABNORMAL HIGH (ref 0.44–1.00)
GFR, Estimated: 51 mL/min — ABNORMAL LOW (ref >60)
Glucose, Bld: 129 mg/dL — ABNORMAL HIGH (ref 70–99)
Potassium: 3.7 mmol/L (ref 3.5–5.1)
Sodium: 139 mmol/L (ref 135–145)

## 2023-03-25 LAB — BRAIN NATRIURETIC PEPTIDE: B Natriuretic Peptide: 327.2 pg/mL — ABNORMAL HIGH (ref 0.0–100.0)

## 2023-03-25 MED ORDER — BISOPROLOL FUMARATE 5 MG PO TABS: 2.5000 mg | ORAL_TABLET | Freq: Every day | ORAL | 3 refills | Status: DC

## 2023-03-25 MED ORDER — SILDENAFIL CITRATE 20 MG PO TABS: 10.0000 mg | ORAL_TABLET | Freq: Two times a day (BID) | ORAL | 3 refills | Status: DC

## 2023-03-25 NOTE — Patient Instructions (Signed)
DECREASE Bisoprolol to 2.5 mg ( 1/2 Tab) daily.  DECREASE Sildenafil to 10 mg Three times a day  Take 40 mg of lasix tomorrow, then back to your normal dose of 20 mg daily.  Labs done today, your results will be available in MyChart, we will contact you for abnormal readings.  Your physician recommends that you schedule a follow-up appointment in: 2 months  If you have any questions or concerns before your next appointment please send [place] a message through [place] or call our office at [phone].    TO LEAVE A MESSAGE FOR THE NURSE SELECT OPTION 2, PLEASE LEAVE A MESSAGE INCLUDING: YOUR NAME DATE OF BIRTH CALL BACK NUMBER REASON FOR CALL**this is important as we prioritize the call backs  YOU WILL RECEIVE A CALL BACK THE SAME DAY AS LONG AS YOU CALL BEFORE 4:00 PM  At the [facility], you and your health needs are our priority. As part of our continuing mission to provide you with exceptional heart care, we have created designated Provider Care Teams. These Care Teams include your primary Cardiologist (physician) and Advanced Practice Providers (APPs- Physician Assistants and Nurse Practitioners) who all work together to provide you with the care you need, when you need it.   You may see any of the following providers on your designated Care Team at your next follow up: Dr Arvilla Meres Dr Marca Ancona Dr. Marcos Eke, NP Robbie Lis, [place] [facility] [place], [place] Brynda Peon, NP Karle Plumber, PharmD   Please be sure to bring in all your medications bottles to every appointment.    Thank you for choosing [facility]

## 2023-03-25 NOTE — Progress Notes (Signed)
ADVANCED HEART FAILURE CLINIC NOTE  Referring Physician: Charlane Ferretti, DO  Primary Care: Charlane Ferretti, DO Primary Cardiologist: [name]  HPI: [name] is a 89 y.o. female with history of CABG in 2007 for left main disease by Dr. Dorris Fetch, persistent atrial fibrillation, hypertension, hyperlipidemia and history of hysterectomy in 2012 and recently diagnosed pulmonary hypertension and severe tricuspid regurgitation presenting today to establish care.  Despite her age, [name] has remained very active.  She continues to go bowling and square dancing however now reports becoming increasingly limited due to exertional dyspnea.  Due to shortness of breath, patient had an echocardiogram that demonstrated severe tricuspid regurgitation with a severely dilated right atrium and severely elevated PA systolic pressures.  She had a right heart cath which further confirmed a PVR of 3-4 Wood units.  Today she presents for follow up.   Interval history Since starting sildenafil at her previous appointment, [name] reports having worsening of shortness of breath. She can still perform all ADLs, ambulate and go bowling; however, now becomes very dyspneic for several seconds after exertion requiring a brief period of rest. She decreased sildenafil from 20mg  TID to BID with some improvement in symptoms. 1+ LE edema.   Activity level/exercise tolerance: NYHA II-III Orthopnea:  Sleeps on 2 pillows Paroxysmal noctural dyspnea: No Chest pain/pressure: Yes but infrequent Orthostatic lightheadedness: No Palpitations: No Lower extremity edema: 1+ Presyncope/syncope: No Cough: No  Past Medical History:  Diagnosis Date   Chronic atrial fibrillation (HCC)    a. Pt declined cardioversion.   Coronary artery disease    a. s/p CABGx2 06/2006 (LIMA-LAD, SVG-LCx at bifurcation of OM II/III). b. Nuc 11/2012: no evidence of ischemia, attenuation artifact c/w breast attenuation in anterior region,  essentially low risk, EF 88%.   Essential hypertension    Hyperlipidemia    Hypothyroidism    Insomnia    on ambien, failed other meds   Iron deficiency anemia    Osteoarthritis    Osteopenia    involved on BMD 06/01/07 (T score-1.6); slightly worse in 3/13 (T score -1.6; FRAX 3%  and 12%)-plan rechcekin 2016   Uterine prolapse    post hysterectomy, Dr. Vincente Poli   UTI (lower urinary tract infection) 3/11    Current Outpatient Medications  Medication Sig Dispense Refill   acetaminophen (TYLENOL) 500 MG tablet Take 500 mg by mouth in the morning, at noon, and at bedtime.     alendronate (FOSAMAX) 70 MG tablet Take 70 mg by mouth once a week.      atorvastatin (LIPITOR) 20 MG tablet TAKE 1 TABLET BY MOUTH EVERY DAY 90 tablet 2   Bioflavonoid Products (ESTER-C) TABS Take 1 tablet by mouth daily.     BIOTIN PO Take 10,000 mg by mouth daily.     bisoprolol (ZEBETA) 5 MG tablet Take 1 tablet (5 mg total) by mouth daily. 90 tablet 3   Calcium Carbonate Antacid (CALCIUM CARBONATE PO) Take 1 tablet by mouth 2 (two) times daily.     digoxin (LANOXIN) 0.125 MG tablet Take 0.5 tablets (0.0625 mg total) by mouth daily. 45 tablet 3   ELIQUIS 2.5 MG TABS tablet TAKE 1 TABLET BY MOUTH TWICE A DAY 180 tablet 1   famotidine (PEPCID) 20 MG tablet Take 20 mg by mouth at bedtime.     ferrous sulfate 325 (65 FE) MG tablet Take 1 tablet (325 mg total) by mouth daily.     furosemide (LASIX) 20 MG tablet Take 1 tablet (  20 mg total) by mouth 2 (two) times daily. 180 tablet 3   Hypromellose (ARTIFICIAL TEARS OP) Place 1 drop into both eyes every 6 (six) hours as needed (dry eyes).     levothyroxine (SYNTHROID, LEVOTHROID) 75 MCG tablet Take 37.5-75 mcg by mouth See admin instructions. Take 75 mg  6 days a week and 37.5 mg every Friday in the morning before breakfast     Misc Natural Products (OSTEO BI-FLEX ADV JOINT SHIELD PO) Take 1 tablet by mouth 2 (two) times daily.      nitroGLYCERIN (NITROSTAT) 0.4 MG SL  tablet Place 1 tablet (0.4 mg total) under the tongue every 5 (five) minutes as needed for chest pain. 25 tablet 0   Omega-3 Fatty Acids (FISH OIL) 1200 MG CAPS Take 1,200 mg by mouth 2 (two) times daily.     senna (SENNA LAX) 8.6 MG tablet Take 1 tablet (8.6 mg total) by mouth daily.     sildenafil (REVATIO) 20 MG tablet Take 20 mg by mouth 2 (two) times daily.     Tiotropium Bromide-Olodaterol (STIOLTO RESPIMAT) 2.5-2.5 MCG/ACT AERS INHALE 2 PUFFS BY MOUTH INTO THE LUNGS DAILY 12 g 1   vitamin B-12 (CYANOCOBALAMIN) 1000 MCG tablet Take 1,000 mcg by mouth daily.     zolpidem (AMBIEN) 5 MG tablet Take 5 mg by mouth at bedtime.     No current facility-administered medications for this encounter.    Allergies  Allergen Reactions   Sulfa Antibiotics Rash      Social History   Socioeconomic History   Marital status: Widowed    Spouse name: Not on file   Number of children: Not on file   Years of education: Not on file   Highest education level: Not on file  Occupational History   Occupation: retired  Tobacco Use   Smoking status: Never    Passive exposure: Past   Smokeless tobacco: Never  Vaping Use   Vaping Use: Never used  Substance and Sexual Activity   Alcohol use: No    Alcohol/week: 0.0 standard drinks of alcohol   Drug use: No   Sexual activity: Not Currently  Other Topics Concern   Not on file  Social History Narrative   Not on file   Social Determinants of Health   Financial Resource Strain: Not on file  Food Insecurity: Not on file  Transportation Needs: Not on file  Physical Activity: Not on file  Stress: Not on file  Social Connections: Not on file  Intimate Partner Violence: Not on file      Family History  Problem Relation Age of Onset   Other Mother    Heart disease Mother    Syncope episode Mother    Ulcers Mother    Heart disease Father    Heart attack Father    CAD Father    Diabetes Father    Lymphoma Sister    Cancer Brother         throat   Brain cancer Brother    Throat cancer Brother    Diabetes Brother    Diabetes type II Son    Colon cancer Neg Hx    Esophageal cancer Neg Hx    Stomach cancer Neg Hx    Pancreatic cancer Neg Hx     PHYSICAL EXAM: Vitals:   03/25/23 1522  BP: (!) 110/58  Pulse: (!) 50  SpO2: 98%   GENERAL: thin elderly F in NAD HEENT: Negative for arcus senilis or xanthelasma. There  is no scleral icterus.  The mucous membranes are pink and moist.   NECK: Supple, No masses. Normal carotid upstrokes without bruits. No masses or thyromegaly.    CHEST: There are no chest wall deformities. There is no chest wall tenderness. Respirations are unlabored.  Lungs- CTA B/L CARDIAC:  JVP: 9-10 with small V waves          Normal rate with regular rhythm. No murmurs, rubs or gallops.  Pulses are 2+ and symmetrical in upper and lower extremities. 1+ edema.  ABDOMEN: Soft, non-tender, non-distended. There are no masses or hepatomegaly. There are normal bowel sounds.  EXTREMITIES: Warm and well perfused with no cyanosis, clubbing.  LYMPHATIC: No axillary or supraclavicular lymphadenopathy.  NEUROLOGIC: Patient is oriented x3 with no focal or lateralizing neurologic deficits.  PSYCH: Patients affect is appropriate, there is no evidence of anxiety or depression.  SKIN: Warm and dry; no lesions or wounds.    DATA REVIEW  ECG: Atrial fibrillation  As per my personal interpretation  ECHO: 02/08/23 LVEF 55%, mildly reduced RV, severely dilated RA. severe TR, moderate MR,  As per my personal interpretation  CATH: 02/17/23:  Hemodynamic findings consistent with mild to moderate pulmonary hypertension.   Aortic saturation 98% noninvasively.  PA saturation 65%.  RA pressure 20/30, mean RA pressure 18 mmHg, RV pressure 58/3, PA pressure 57/19, mean PA pressure 34 mmHg, pulmonary capillary wedge pressure 18/29, mean pulmonary capillary wedge pressure 19.  Cardiac output 4.3 L/min, cardiac index 2.85, calculated  PVR 3.48.   prominent V waves noted on the wedge tracing.   RA waveform suggestive of significant tricuspid regurgitation.   02/25/23: Pt ambulated 228.6 meters O2 Sat ranged 99 on 96 on room air HR ranged 77-86    ASSESSMENT & PLAN:  Pulmonary Hypertension, severe tricuspid regurgitation, RV dysfunction -Echocardiogram demonstrates severely dilated right atrium with severe torrential tricuspid regurgitation due to poor tricuspid leaflet coaptation.  She has had a right heart cath with a PVR of 3.5 Wood units, RA pressure of 20-30 with large V waves and a PA mean of 34 mmHg. -Reds today down to 29% from 39% previously.  -She does have PFTs from 2017 that demonstrate obstructive lung disease that is at least moderate from secondhand smoking exposure.  6-minute walk test today at last appointment.  I had a lengthy discussion with her today regarding various medication combinations that we can try to improve her functional status.  - Difficulty tolerating sildenafil; this is likely due to hypotension and underlying mild - mod MR. Will decrease dose to 10mg  and ultimately discontinue if she remains symptomatic.  - CT chest pending.  - Continue digoxin 0.0667mcg - Decrease bisoprolol to 2.5mg  (HR 50 today)  2.  Persistent atrial fibrillation -Continue bisoprolol - may benefit from cardioversion in the future. -apixaban 2.5mg  BID  3. CAD s/p CABG - followed by Dr. Ardean Larsen Advanced Heart Failure Mechanical Circulatory Support

## 2023-03-29 ENCOUNTER — Other Ambulatory Visit: Payer: Self-pay | Admitting: Pulmonary Disease

## 2023-03-31 ENCOUNTER — Telehealth: Payer: Self-pay | Admitting: Pulmonary Disease

## 2023-03-31 NOTE — Telephone Encounter (Signed)
[facility] calling in to make sure of the current medication changes. A prescription sent in was [place] and she currently takes [employer]

## 2023-04-04 NOTE — Telephone Encounter (Signed)
Pt is supposed to be on Stiolto, not Spiriva. I have discontinued Spiriva off of pt's med list.  Called pt's pharmacy and spoke with Arline Asp letting her know that pt is only supposed to be on Stiolto and that they could take Spiriva out of system and she verbalized understanding. Nothing further needed.

## 2023-04-22 DIAGNOSIS — Z8701 Personal history of pneumonia (recurrent): Secondary | ICD-10-CM | POA: Diagnosis not present

## 2023-04-22 DIAGNOSIS — G4700 Insomnia, unspecified: Secondary | ICD-10-CM | POA: Diagnosis not present

## 2023-04-22 DIAGNOSIS — M1990 Unspecified osteoarthritis, unspecified site: Secondary | ICD-10-CM | POA: Diagnosis not present

## 2023-04-22 DIAGNOSIS — Z7901 Long term (current) use of anticoagulants: Secondary | ICD-10-CM | POA: Diagnosis not present

## 2023-04-22 DIAGNOSIS — M810 Age-related osteoporosis without current pathological fracture: Secondary | ICD-10-CM | POA: Diagnosis not present

## 2023-04-22 DIAGNOSIS — Z85828 Personal history of other malignant neoplasm of skin: Secondary | ICD-10-CM | POA: Diagnosis not present

## 2023-04-22 DIAGNOSIS — K5900 Constipation, unspecified: Secondary | ICD-10-CM | POA: Diagnosis not present

## 2023-04-22 DIAGNOSIS — Z882 Allergy status to sulfonamides status: Secondary | ICD-10-CM | POA: Diagnosis not present

## 2023-04-22 DIAGNOSIS — E8801 Alpha-1-antitrypsin deficiency: Secondary | ICD-10-CM | POA: Diagnosis not present

## 2023-04-22 DIAGNOSIS — E039 Hypothyroidism, unspecified: Secondary | ICD-10-CM | POA: Diagnosis not present

## 2023-04-22 DIAGNOSIS — Z7983 Long term (current) use of bisphosphonates: Secondary | ICD-10-CM | POA: Diagnosis not present

## 2023-04-22 DIAGNOSIS — E785 Hyperlipidemia, unspecified: Secondary | ICD-10-CM | POA: Diagnosis not present

## 2023-04-22 DIAGNOSIS — J439 Emphysema, unspecified: Secondary | ICD-10-CM | POA: Diagnosis not present

## 2023-04-22 DIAGNOSIS — R32 Unspecified urinary incontinence: Secondary | ICD-10-CM | POA: Diagnosis not present

## 2023-04-22 DIAGNOSIS — M545 Low back pain, unspecified: Secondary | ICD-10-CM | POA: Diagnosis not present

## 2023-04-22 DIAGNOSIS — K219 Gastro-esophageal reflux disease without esophagitis: Secondary | ICD-10-CM | POA: Diagnosis not present

## 2023-05-19 NOTE — Progress Notes (Signed)
ADVANCED HEART FAILURE CLINIC NOTE  Referring Physician: Charlane Ferretti, DO  Primary Care: Charlane Ferretti, DO Primary Cardiologist: [name]  HPI: [name] is a 89 y.o. female with history of CABG in 2007 for left main disease by Dr. Dorris Fetch, persistent atrial fibrillation, hypertension, hyperlipidemia and history of hysterectomy in 2012 and recently diagnosed pulmonary hypertension and severe tricuspid regurgitation presenting today to establish care.  Despite her age, [name] has remained very active.  She continues to go bowling and square dancing however now reports becoming increasingly limited due to exertional dyspnea.  Due to shortness of breath, patient had an echocardiogram that demonstrated severe tricuspid regurgitation with a severely dilated right atrium and severely elevated PA systolic pressures.  She had a right heart cath which further confirmed a PVR of 3-4 Wood units.  Today she presents for follow up.   Interval history Tolerating sildenafil 10mg  TID very well. She goes on daily walks without significant dyspnea. No PND, orthostatic hypotension. Only complaints today are RLE edema that has progressed. She wishes to return to bowling this fall.   Activity level/exercise tolerance: NYHA II-III Orthopnea:  Sleeps on 2 pillows Paroxysmal noctural dyspnea: No Chest pain/pressure: Yes but infrequent Orthostatic lightheadedness: No Palpitations: No Lower extremity edema: 1+ Presyncope/syncope: No Cough: No  Past Medical History:  Diagnosis Date   Chronic atrial fibrillation (HCC)    a. Pt declined cardioversion.   Coronary artery disease    a. s/p CABGx2 06/2006 (LIMA-LAD, SVG-LCx at bifurcation of OM II/III). b. Nuc 11/2012: no evidence of ischemia, attenuation artifact c/w breast attenuation in anterior region, essentially low risk, EF 88%.   Essential hypertension    Hyperlipidemia    Hypothyroidism    Insomnia    on ambien, failed other meds   Iron  deficiency anemia    Osteoarthritis    Osteopenia    involved on BMD 06/01/07 (T score-1.6); slightly worse in 3/13 (T score -1.6; FRAX 3%  and 12%)-plan rechcekin 2016   Uterine prolapse    post hysterectomy, Dr. Vincente Poli   UTI (lower urinary tract infection) 3/11    Current Outpatient Medications  Medication Sig Dispense Refill   acetaminophen (TYLENOL) 500 MG tablet Take 500 mg by mouth in the morning, at noon, and at bedtime.     alendronate (FOSAMAX) 70 MG tablet Take 70 mg by mouth once a week.      atorvastatin (LIPITOR) 20 MG tablet TAKE 1 TABLET BY MOUTH EVERY DAY 90 tablet 2   Bioflavonoid Products (ESTER-C) TABS Take 1 tablet by mouth daily.     BIOTIN PO Take 10,000 mg by mouth daily.     bisoprolol (ZEBETA) 5 MG tablet Take 0.5 tablets (2.5 mg total) by mouth daily. 90 tablet 3   Calcium Carbonate Antacid (CALCIUM CARBONATE PO) Take 1 tablet by mouth 2 (two) times daily.     digoxin (LANOXIN) 0.125 MG tablet Take 0.5 tablets (0.0625 mg total) by mouth daily. 45 tablet 3   ELIQUIS 2.5 MG TABS tablet TAKE 1 TABLET BY MOUTH TWICE A DAY 180 tablet 1   famotidine (PEPCID) 20 MG tablet Take 20 mg by mouth at bedtime.     ferrous sulfate 325 (65 FE) MG tablet Take 1 tablet (325 mg total) by mouth daily.     furosemide (LASIX) 20 MG tablet Take 1 tablet (20 mg total) by mouth 2 (two) times daily. 180 tablet 3   Hypromellose (ARTIFICIAL TEARS OP) Place 1 drop into both  eyes every 6 (six) hours as needed (dry eyes).     levothyroxine (SYNTHROID, LEVOTHROID) 75 MCG tablet Take 37.5-75 mcg by mouth See admin instructions. Take 75 mg  6 days a week and 37.5 mg every Friday in the morning before breakfast     Misc Natural Products (OSTEO BI-FLEX ADV JOINT SHIELD PO) Take 1 tablet by mouth 2 (two) times daily.      nitroGLYCERIN (NITROSTAT) 0.4 MG SL tablet Place 1 tablet (0.4 mg total) under the tongue every 5 (five) minutes as needed for chest pain. 25 tablet 0   Omega-3 Fatty Acids (FISH  OIL) 1200 MG CAPS Take 1,200 mg by mouth 2 (two) times daily.     senna (SENNA LAX) 8.6 MG tablet Take 1 tablet (8.6 mg total) by mouth daily.     sildenafil (REVATIO) 20 MG tablet Take 0.5 tablets (10 mg total) by mouth 2 (two) times daily. 45 tablet 3   Tiotropium Bromide-Olodaterol (STIOLTO RESPIMAT) 2.5-2.5 MCG/ACT AERS INHALE 2 PUFFS BY MOUTH INTO THE LUNGS DAILY 12 g 1   vitamin B-12 (CYANOCOBALAMIN) 1000 MCG tablet Take 1,000 mcg by mouth daily.     zolpidem (AMBIEN) 5 MG tablet Take 5 mg by mouth at bedtime.     No current facility-administered medications for this visit.    Allergies  Allergen Reactions   Sulfa Antibiotics Rash      Social History   Socioeconomic History   Marital status: Widowed    Spouse name: Not on file   Number of children: Not on file   Years of education: Not on file   Highest education level: Not on file  Occupational History   Occupation: retired  Tobacco Use   Smoking status: Never    Passive exposure: Past   Smokeless tobacco: Never  Vaping Use   Vaping status: Never Used  Substance and Sexual Activity   Alcohol use: No    Alcohol/week: 0.0 standard drinks of alcohol   Drug use: No   Sexual activity: Not Currently  Other Topics Concern   Not on file  Social History Narrative   Not on file   Social Determinants of Health   Financial Resource Strain: Not on file  Food Insecurity: Not on file  Transportation Needs: Not on file  Physical Activity: Not on file  Stress: Not on file  Social Connections: Not on file  Intimate Partner Violence: Not on file      Family History  Problem Relation Age of Onset   Other Mother    Heart disease Mother    Syncope episode Mother    Ulcers Mother    Heart disease Father    Heart attack Father    CAD Father    Diabetes Father    Lymphoma Sister    Cancer Brother        throat   Brain cancer Brother    Throat cancer Brother    Diabetes Brother    Diabetes type II Son    Colon  cancer Neg Hx    Esophageal cancer Neg Hx    Stomach cancer Neg Hx    Pancreatic cancer Neg Hx     PHYSICAL EXAM: Vitals:   05/20/23 1518  BP: (!) 144/64  Pulse: (!) 59  SpO2: 97%   GENERAL: Well nourished, well developed, and in no apparent distress at rest.  HEENT: Negative for arcus senilis or xanthelasma. There is no scleral icterus.  The mucous membranes are pink and moist.  NECK: Supple, No masses. Normal carotid upstrokes without bruits. No masses or thyromegaly.    CHEST: There are no chest wall deformities. There is no chest wall tenderness. Respirations are unlabored.  Lungs- CTA B/L CARDIAC:  JVP: 7 cm          Normal rate with regular rhythm. No murmurs, rubs or gallops.  Pulses are 2+ and symmetrical in upper and lower extremities. 1+ on right, 2+ edema on left.   ABDOMEN: Soft, non-tender, non-distended. There are no masses or hepatomegaly. There are normal bowel sounds.  EXTREMITIES: Warm and well perfused with no cyanosis, clubbing.  LYMPHATIC: No axillary or supraclavicular lymphadenopathy.  NEUROLOGIC: Patient is oriented x3 with no focal or lateralizing neurologic deficits.  PSYCH: Patients affect is appropriate, there is no evidence of anxiety or depression.  SKIN: Warm and dry; no lesions or wounds.     DATA REVIEW  ECG: 02/25/23: Atrial fibrillation  As per my personal interpretation  ECHO: 02/08/23 LVEF 55%, mildly reduced RV, severely dilated RA. severe TR, moderate MR,  As per my personal interpretation  CATH: 02/17/23:  Hemodynamic findings consistent with mild to moderate pulmonary hypertension.   Aortic saturation 98% noninvasively.  PA saturation 65%.  RA pressure 20/30, mean RA pressure 18 mmHg, RV pressure 58/3, PA pressure 57/19, mean PA pressure 34 mmHg, pulmonary capillary wedge pressure 18/29, mean pulmonary capillary wedge pressure 19.  Cardiac output 4.3 L/min, cardiac index 2.85, calculated PVR 3.48.   prominent V waves noted on the wedge  tracing.   RA waveform suggestive of significant tricuspid regurgitation.   02/25/23: Pt ambulated 228.6 meters O2 Sat ranged 99 on 96 on room air HR ranged 77-86    ASSESSMENT & PLAN:  Pulmonary Hypertension, severe tricuspid regurgitation, RV dysfunction -Echocardiogram demonstrates severely dilated right atrium with severe torrential tricuspid regurgitation due to poor tricuspid leaflet coaptation.  She has had a right heart cath with a PVR of 3.5 Wood units, RA pressure of 20-30 with large V waves and a PA mean of 34 mmHg. -Reds 35% today. Increase lasix to 40mg  for the next 2 days. Then 20mg  BID.   -She does have PFTs from 2017 that demonstrate obstructive lung disease that is at least moderate from secondhand smoking exposure.  6-minute walk test today at last appointment.  I had a lengthy discussion with her today regarding various medication combinations that we can try to improve her functional status.  - Difficulty tolerating sildenafil; this is likely due to hypotension and underlying mild - mod MR. Continue sildenafil 10mg  TID - CT chest (03/24/23) w/o significant parenchymal lung disease.  - Continue digoxin 0.0658mcg - Continue bisoprolol to 2.5mg  (HR 50 today)  2.  Persistent atrial fibrillation -Continue bisoprolol - may benefit from cardioversion in the future. -apixaban 2.5mg  BID  3. CAD s/p CABG - followed by Dr. Ardean Larsen Advanced Heart Failure Mechanical Circulatory Support

## 2023-05-20 ENCOUNTER — Encounter (HOSPITAL_COMMUNITY): Payer: Self-pay | Admitting: Cardiology

## 2023-05-20 ENCOUNTER — Ambulatory Visit (HOSPITAL_COMMUNITY)
Admission: RE | Admit: 2023-05-20 | Discharge: 2023-05-20 | Disposition: A | Discharge status: Home / Self Care | Payer: Medicare PPO | Source: Ambulatory Visit | Attending: Cardiology | Admitting: Cardiology

## 2023-05-20 VITALS — BP 144/64 | HR 59 | Wt 110.2 lb

## 2023-05-20 DIAGNOSIS — Z79899 Other long term (current) drug therapy: Secondary | ICD-10-CM | POA: Insufficient documentation

## 2023-05-20 DIAGNOSIS — I519 Heart disease, unspecified: Secondary | ICD-10-CM

## 2023-05-20 DIAGNOSIS — Z8249 Family history of ischemic heart disease and other diseases of the circulatory system: Secondary | ICD-10-CM | POA: Diagnosis not present

## 2023-05-20 DIAGNOSIS — Z90710 Acquired absence of both cervix and uterus: Secondary | ICD-10-CM | POA: Insufficient documentation

## 2023-05-20 DIAGNOSIS — R0609 Other forms of dyspnea: Secondary | ICD-10-CM | POA: Insufficient documentation

## 2023-05-20 DIAGNOSIS — R0602 Shortness of breath: Secondary | ICD-10-CM | POA: Diagnosis not present

## 2023-05-20 DIAGNOSIS — Z833 Family history of diabetes mellitus: Secondary | ICD-10-CM | POA: Insufficient documentation

## 2023-05-20 DIAGNOSIS — Z7722 Contact with and (suspected) exposure to environmental tobacco smoke (acute) (chronic): Secondary | ICD-10-CM | POA: Diagnosis not present

## 2023-05-20 DIAGNOSIS — Z951 Presence of aortocoronary bypass graft: Secondary | ICD-10-CM | POA: Insufficient documentation

## 2023-05-20 DIAGNOSIS — I4819 Other persistent atrial fibrillation: Secondary | ICD-10-CM | POA: Diagnosis not present

## 2023-05-20 DIAGNOSIS — I071 Rheumatic tricuspid insufficiency: Secondary | ICD-10-CM | POA: Insufficient documentation

## 2023-05-20 DIAGNOSIS — I2510 Atherosclerotic heart disease of native coronary artery without angina pectoris: Secondary | ICD-10-CM | POA: Insufficient documentation

## 2023-05-20 DIAGNOSIS — I272 Pulmonary hypertension, unspecified: Secondary | ICD-10-CM | POA: Diagnosis not present

## 2023-05-20 DIAGNOSIS — E785 Hyperlipidemia, unspecified: Secondary | ICD-10-CM | POA: Diagnosis not present

## 2023-05-20 DIAGNOSIS — I119 Hypertensive heart disease without heart failure: Secondary | ICD-10-CM | POA: Diagnosis not present

## 2023-05-20 DIAGNOSIS — J449 Chronic obstructive pulmonary disease, unspecified: Secondary | ICD-10-CM | POA: Insufficient documentation

## 2023-05-20 DIAGNOSIS — Z7901 Long term (current) use of anticoagulants: Secondary | ICD-10-CM | POA: Insufficient documentation

## 2023-05-20 DIAGNOSIS — I739 Peripheral vascular disease, unspecified: Secondary | ICD-10-CM | POA: Diagnosis not present

## 2023-05-20 LAB — BASIC METABOLIC PANEL
Anion gap: 9 (ref 5–15)
BUN: 18 mg/dL (ref 8–23)
CO2: 24 mmol/L (ref 22–32)
Calcium: 9.4 mg/dL (ref 8.9–10.3)
Chloride: 105 mmol/L (ref 98–111)
Creatinine, Ser: 0.93 mg/dL (ref 0.44–1.00)
GFR, Estimated: 59 mL/min — ABNORMAL LOW (ref >60)
Glucose, Bld: 95 mg/dL (ref 70–99)
Potassium: 3.4 mmol/L — ABNORMAL LOW (ref 3.5–5.1)
Sodium: 138 mmol/L (ref 135–145)

## 2023-05-20 LAB — BRAIN NATRIURETIC PEPTIDE: B Natriuretic Peptide: 394.4 pg/mL — ABNORMAL HIGH (ref 0.0–100.0)

## 2023-05-20 NOTE — Patient Instructions (Signed)
TAKE Lasix 40 mg today and tomorrow morning, then back to 20 mg Twice daily   Labs done today, your results will be available in MyChart, we will contact you for abnormal readings.  Your provider has order a ultra sound of your leg. You will be  called to have this test arranged.  Your physician recommends that you schedule a follow-up appointment in: 2 Months with Dr Gasper Lloyd as scheduled

## 2023-05-20 NOTE — Progress Notes (Signed)
ReDS Vest / Clip - 05/20/23 1600       ReDS Vest / Clip   Station Marker A    Ruler Value 24    ReDS Value Range Moderate volume overload    ReDS Actual Value 35

## 2023-05-24 DIAGNOSIS — D6869 Other thrombophilia: Secondary | ICD-10-CM | POA: Diagnosis not present

## 2023-05-24 DIAGNOSIS — I272 Pulmonary hypertension, unspecified: Secondary | ICD-10-CM | POA: Diagnosis not present

## 2023-05-24 DIAGNOSIS — E876 Hypokalemia: Secondary | ICD-10-CM | POA: Diagnosis not present

## 2023-05-24 DIAGNOSIS — I25118 Atherosclerotic heart disease of native coronary artery with other forms of angina pectoris: Secondary | ICD-10-CM | POA: Diagnosis not present

## 2023-05-24 DIAGNOSIS — M810 Age-related osteoporosis without current pathological fracture: Secondary | ICD-10-CM | POA: Diagnosis not present

## 2023-05-24 DIAGNOSIS — I482 Chronic atrial fibrillation, unspecified: Secondary | ICD-10-CM | POA: Diagnosis not present

## 2023-05-24 DIAGNOSIS — I700 Atherosclerosis of aorta: Secondary | ICD-10-CM | POA: Diagnosis not present

## 2023-05-24 DIAGNOSIS — I739 Peripheral vascular disease, unspecified: Secondary | ICD-10-CM | POA: Diagnosis not present

## 2023-05-24 DIAGNOSIS — D692 Other nonthrombocytopenic purpura: Secondary | ICD-10-CM | POA: Diagnosis not present

## 2023-06-02 ENCOUNTER — Ambulatory Visit (HOSPITAL_COMMUNITY)
Admission: RE | Admit: 2023-06-02 | Discharge: 2023-06-02 | Disposition: A | Discharge status: Home / Self Care | Payer: Medicare PPO | Source: Ambulatory Visit | Attending: Cardiology | Admitting: Cardiology

## 2023-06-02 DIAGNOSIS — I739 Peripheral vascular disease, unspecified: Secondary | ICD-10-CM | POA: Insufficient documentation

## 2023-06-08 DIAGNOSIS — I4891 Unspecified atrial fibrillation: Secondary | ICD-10-CM | POA: Diagnosis not present

## 2023-06-08 DIAGNOSIS — L729 Follicular cyst of the skin and subcutaneous tissue, unspecified: Secondary | ICD-10-CM | POA: Diagnosis not present

## 2023-06-08 DIAGNOSIS — M545 Low back pain, unspecified: Secondary | ICD-10-CM | POA: Diagnosis not present

## 2023-06-08 DIAGNOSIS — I25118 Atherosclerotic heart disease of native coronary artery with other forms of angina pectoris: Secondary | ICD-10-CM | POA: Diagnosis not present

## 2023-06-08 DIAGNOSIS — D6869 Other thrombophilia: Secondary | ICD-10-CM | POA: Diagnosis not present

## 2023-06-08 DIAGNOSIS — I482 Chronic atrial fibrillation, unspecified: Secondary | ICD-10-CM | POA: Diagnosis not present

## 2023-06-08 DIAGNOSIS — I739 Peripheral vascular disease, unspecified: Secondary | ICD-10-CM | POA: Diagnosis not present

## 2023-06-08 DIAGNOSIS — E876 Hypokalemia: Secondary | ICD-10-CM | POA: Diagnosis not present

## 2023-06-08 DIAGNOSIS — I700 Atherosclerosis of aorta: Secondary | ICD-10-CM | POA: Diagnosis not present

## 2023-06-20 ENCOUNTER — Ambulatory Visit: Payer: Medicare PPO | Admitting: Physician Assistant

## 2023-06-21 ENCOUNTER — Other Ambulatory Visit: Payer: Self-pay | Admitting: Pulmonary Disease

## 2023-06-22 DIAGNOSIS — R2242 Localized swelling, mass and lump, left lower limb: Secondary | ICD-10-CM | POA: Diagnosis not present

## 2023-06-22 DIAGNOSIS — M25562 Pain in left knee: Secondary | ICD-10-CM | POA: Diagnosis not present

## 2023-06-22 DIAGNOSIS — M79605 Pain in left leg: Secondary | ICD-10-CM | POA: Diagnosis not present

## 2023-07-01 ENCOUNTER — Telehealth: Payer: Self-pay | Admitting: Pulmonary Disease

## 2023-07-01 NOTE — Telephone Encounter (Signed)
PT calling for a refill of  STIOLTO RESPIMAT   [facility] [place].  Pharm has fax'd several times and told her she'd need to call us when they got no reply.  Her # is [phone]

## 2023-07-04 NOTE — Progress Notes (Unsigned)
Cardiology Office Note:  .   Date:  07/05/2023  ID:  [name], DOB 07/20/1932, MRN [id number] PCP: Charlane Ferretti, DO  [facility] Providers Cardiologist:  Lance Muss, MD {  History of Present Illness: .   [name] is a 89 y.o. female with a history of CABG in 2007 for left main disease back to Dr. Dorris Fetch,  persistent atrial fibrillation, HTN, HLD, and history of hysterectomy in 2012 and recently diagnosed pulmonary hypertension and severe tricuspid regurgitation who was recently seen by the CHF clinic for pulmonary hypertension.  She is here for follow-up appointment.  Despite her age patient has remained very active.  She continues to go bowling as were dancing however now reports becoming increasingly limited due to exertional dyspnea.  Due to shortness of breath, patient had an echo which demonstrated severe tricuspid regurgitation with a severely dilated right atrium and severely elevated PA systolic pressures.  She had a right cath as well.  She was started on sildenafil 10 mg 3 times daily and was tolerating it well for pulmonary hypertension.  Goes on walks without significant dyspnea.  No  PND, orthostatic hypotension.  Only complaints at that visit was RLE edema that had progressed.  Today, she tells me she was having some leg manage metoprolol sildenafil pills so she was reduced to half a pill twice a day.  She had a fall 2 weeks ago but it sounds more mechanical in nature.  She just had her cleaning person cleaning the floors and they were let and when she went to walk on then she slipped and caught herself on the countertop and bumped her head (right above her left eye).  Feeling better.  She tells me she is getting an MRI of her left leg due to a cyst that is present.  Luckily, was not a blood clot when they ultrasounded it.  She was given some anxiety medication to take an hour before.  She has a pulmonologist and she sees them not to refer her shortness of  breath.  She does feel like its gotten a little bit better.  No chest pain.  Reports no shortness of breath nor dyspnea on exertion. Reports no chest pain, pressure, or tightness. No edema, orthopnea, PND. Reports no palpitations.   ROS: Pertinent ROS HPI  Studies Reviewed: [name]       ECG: 02/25/23: Atrial fibrillation  As per my personal interpretation   ECHO: 02/08/23 LVEF 55%, mildly reduced RV, severely dilated RA. severe TR, moderate MR,  As per my personal interpretation   CATH: 02/17/23:  Hemodynamic findings consistent with mild to moderate pulmonary hypertension.   Aortic saturation 98% noninvasively.  PA saturation 65%.  RA pressure 20/30, mean RA pressure 18 mmHg, RV pressure 58/3, PA pressure 57/19, mean PA pressure 34 mmHg, pulmonary capillary wedge pressure 18/29, mean pulmonary capillary wedge pressure 19.  Cardiac output 4.3 L/min, cardiac index 2.85, calculated PVR 3.48.   prominent V waves noted on the wedge tracing.   RA waveform suggestive of significant tricuspid regurgitation.  Physical Exam:   VS:  BP 118/76   Pulse 62   Ht 5' 2.5" (1.588 m)   Wt 113 lb 3.2 oz (51.3 kg)   SpO2 96%   BMI 20.37 kg/m    Wt Readings from Last 3 Encounters:  07/05/23 113 lb 3.2 oz (51.3 kg)  05/20/23 110 lb 3.2 oz (50 kg)  03/25/23 111 lb 6.4 oz (50.5 kg)  GEN: Well nourished, well developed in no acute distress NECK: No JVD; No carotid bruits CARDIAC: Irregularly irregular, no murmurs, rubs, gallops RESPIRATORY:  Clear to auscultation without rales, wheezing or rhonchi  ABDOMEN: Soft, non-tender, non-distended EXTREMITIES:  No edema; No deformity   ASSESSMENT AND PLAN: .   1.  CAD status post CABG -Recommend following current medication regimen including Eliquis 2.5 mg twice a day, Lipitor 20 mg a day, bisoprolol 2.5 mg twice a day, Lasix 20 mg twice a day, nitroglycerin as, fish oil 12,000 mg twice a day, sildenafil 10 mg twice a day -no chest pains sometimes she feels like  her bra is cutting off her breath -Once she removes her bra, the sensation goes away, not likely cardiac in nature -Shortness of breath has been about the same -she starts back Monday bowling   2.  Persistent atrial fibrillation -no symptoms with it.  -continue Eliquis   3.  Pulmonary hypertension   -She has had some difficulty tolerating sildenafil but her dose has been adjusted to half a tab twice a day and it seems like the lightheadedness/dizziness is better.  Blood pressure is well-controlled today 118/76. -She has follow-up with the CHF clinic in about 2 weeks   Dispo: Would recommend following up with CHF clinic in 2 weeks and following up with Dr. Izora Ribas  to establish care in 6 months  Signed, Sharlene Dory, PA-C

## 2023-07-05 ENCOUNTER — Ambulatory Visit: Payer: Medicare PPO | Attending: Physician Assistant | Admitting: Physician Assistant

## 2023-07-05 ENCOUNTER — Encounter: Payer: Self-pay | Admitting: Physician Assistant

## 2023-07-05 VITALS — BP 118/76 | HR 62 | Ht 62.5 in | Wt 113.2 lb

## 2023-07-05 DIAGNOSIS — I2510 Atherosclerotic heart disease of native coronary artery without angina pectoris: Secondary | ICD-10-CM

## 2023-07-05 DIAGNOSIS — I4819 Other persistent atrial fibrillation: Secondary | ICD-10-CM | POA: Diagnosis not present

## 2023-07-05 DIAGNOSIS — Z951 Presence of aortocoronary bypass graft: Secondary | ICD-10-CM

## 2023-07-05 NOTE — Patient Instructions (Signed)
Medication Instructions:   Your physician recommends that you continue on your current medications as directed. Please refer to the Current Medication list given to you today.   *If you need a refill on your cardiac medications before your next appointment, please call your pharmacy*   Lab Work: NONE ORDERED  TODAY   If you have labs (blood work) drawn today and your tests are completely normal, you will receive your results only by: MyChart Message (if you have MyChart) OR A paper copy in the mail If you have any lab test that is abnormal or we need to change your treatment, we will call you to review the results.   Testing/Procedures: NONE ORDERED  TODAY    Follow-Up: At [facility], you and your health needs are our priority.  As part of our continuing mission to provide you with exceptional heart care, we have created designated Provider Care Teams.  These Care Teams include your primary Cardiologist (physician) and Advanced Practice Providers (APPs -  Physician Assistants and Nurse Practitioners) who all work together to provide you with the care you need, when you need it.  We recommend signing up for the patient portal called "MyChart".  Sign up information is provided on this After Visit Summary.  MyChart is used to connect with patients for Virtual Visits (Telemedicine).  Patients are able to view lab/test results, encounter notes, upcoming appointments, etc.  Non-urgent messages can be sent to your provider as well.   To learn more about what you can do with MyChart, go to [email or web address].    Your next appointment:   6 month(s)  Provider:   DR. [facility]   Other Instructions  Low-Sodium Eating Plan Salt (sodium) helps you keep a healthy balance of fluids in your body. Too much sodium can raise your blood pressure. It can also cause fluid and waste to be held in your body. Your health care provider or dietitian may recommend a low-sodium eating plan  if you have high blood pressure (hypertension), kidney disease, liver disease, or heart failure. Eating less sodium can help lower your blood pressure and reduce swelling. It can also protect your heart, liver, and kidneys. What are tips for following this plan? Reading food labels  Check food labels for the amount of sodium per serving. If you eat more than one serving, you must multiply the listed amount by the number of servings. Choose foods with less than 140 milligrams (mg) of sodium per serving. Avoid foods with 300 mg of sodium or more per serving. Always check how much sodium is in a product, even if the label says "unsalted" or "no salt added." Shopping  Buy products labeled as "low-sodium" or "no salt added." Buy fresh foods. Avoid canned foods and pre-made or frozen meals. Avoid canned, cured, or processed meats. Buy breads that have less than 80 mg of sodium per slice. Cooking  Eat more home-cooked food. Try to eat less restaurant, buffet, and fast food. Try not to add salt when you cook. Use salt-free seasonings or herbs instead of table salt or sea salt. Check with your provider or pharmacist before using salt substitutes. Cook with plant-based oils, such as canola, sunflower, or olive oil. Meal planning When eating at a restaurant, ask if your food can be made with less salt or no salt. Avoid dishes labeled as brined, pickled, cured, or smoked. Avoid dishes made with soy sauce, miso, or teriyaki sauce. Avoid foods that have monosodium glutamate (MSG) in them. MSG  may be added to some restaurant food, sauces, soups, bouillon, and canned foods. Make meals that can be grilled, baked, poached, roasted, or steamed. These are often made with less sodium. General information Try to limit your sodium intake to 1,500-2,300 mg each day, or the amount told by your provider. What foods should I eat? Fruits Fresh, frozen, or canned fruit. Fruit juice. Vegetables Fresh or frozen  vegetables. "No salt added" canned vegetables. "No salt added" tomato sauce and paste. Low-sodium or reduced-sodium tomato and vegetable juice. Grains Low-sodium cereals, such as oats, puffed wheat and rice, and shredded wheat. Low-sodium crackers. Unsalted rice. Unsalted pasta. Low-sodium bread. Whole grain breads and whole grain pasta. Meats and other proteins Fresh or frozen meat, poultry, seafood, and fish. These should have no added salt. Low-sodium canned tuna and salmon. Unsalted nuts. Dried peas, beans, and lentils without added salt. Unsalted canned beans. Eggs. Unsalted nut butters. Dairy Milk. Soy milk. Cheese that is naturally low in sodium, such as ricotta cheese, fresh mozzarella, or Swiss cheese. Low-sodium or reduced-sodium cheese. Cream cheese. Yogurt. Seasonings and condiments Fresh and dried herbs and spices. Salt-free seasonings. Low-sodium mustard and ketchup. Sodium-free salad dressing. Sodium-free light mayonnaise. Fresh or refrigerated horseradish. Lemon juice. Vinegar. Other foods Homemade, reduced-sodium, or low-sodium soups. Unsalted popcorn and pretzels. Low-salt or salt-free chips. The items listed above may not be all the foods and drinks you can have. Talk to a dietitian to learn more. What foods should I avoid? Vegetables Sauerkraut, pickled vegetables, and relishes. Olives. [place] fries. Onion rings. Regular canned vegetables, except low-sodium or reduced-sodium items. Regular canned tomato sauce and paste. Regular tomato and vegetable juice. Frozen vegetables in sauces. Grains Instant hot cereals. Bread stuffing, pancake, and biscuit mixes. Croutons. Seasoned rice or pasta mixes. Noodle soup cups. Boxed or frozen macaroni and cheese. Regular salted crackers. Self-rising flour. Meats and other proteins Meat or fish that is salted, canned, smoked, spiced, or pickled. Precooked or cured meat, such as sausages or meat loaves. Tomasa Blase. Ham. Pepperoni. Hot dogs. Corned  beef. Chipped beef. Salt pork. Jerky. Pickled herring, anchovies, and sardines. Regular canned tuna. Salted nuts. Dairy Processed cheese and cheese spreads. Hard cheeses. Cheese curds. Blue cheese. Feta cheese. String cheese. Regular cottage cheese. Buttermilk. Canned milk. Fats and oils Salted butter. Regular margarine. Ghee. Bacon fat. Seasonings and condiments Onion salt, garlic salt, seasoned salt, table salt, and sea salt. Canned and packaged gravies. Worcestershire sauce. Tartar sauce. Barbecue sauce. Teriyaki sauce. Soy sauce, including reduced-sodium soy sauce. Steak sauce. Fish sauce. Oyster sauce. Cocktail sauce. Horseradish that you find on the shelf. Regular ketchup and mustard. Meat flavorings and tenderizers. Bouillon cubes. Hot sauce. Pre-made or packaged marinades. Pre-made or packaged taco seasonings. Relishes. Regular salad dressings. Salsa. Other foods Salted popcorn and pretzels. Corn chips and puffs. Potato and tortilla chips. Canned or dried soups. Pizza. Frozen entrees and pot pies. The items listed above may not be all the foods and drinks you should avoid. Talk to a dietitian to learn more. This information is not intended to replace advice given to you by your health care provider. Make sure you discuss any questions you have with your health care provider. Document Revised: 11/04/2022 Document Reviewed: 11/04/2022 Elsevier Patient Education  2024 [employer]   Heart-Healthy Eating Plan Eating a healthy diet is important for the health of your heart. A heart-healthy eating plan includes: Eating less unhealthy fats. Eating more healthy fats. Eating less salt in your food. Salt is also called sodium.  Making other changes in your diet. Talk with your doctor or a diet specialist (dietitian) to create an eating plan that is right for you. What is my plan? Your doctor may recommend an eating plan that includes: Total fat: ______% or less of total calories a  day. Saturated fat: ______% or less of total calories a day. Cholesterol: less than _________mg a day. Sodium: less than _________mg a day. What are tips for following this plan? Cooking Avoid frying your food. Try to bake, boil, grill, or broil it instead. You can also reduce fat by: Removing the skin from poultry. Removing all visible fats from meats. Steaming vegetables in water or broth. Meal planning  At meals, divide your plate into four equal parts: Fill one-half of your plate with vegetables and green salads. Fill one-fourth of your plate with whole grains. Fill one-fourth of your plate with lean protein foods. Eat 2-4 cups of vegetables per day. One cup of vegetables is: 1 cup (91 g) broccoli or cauliflower florets. 2 medium carrots. 1 large bell pepper. 1 large sweet potato. 1 large tomato. 1 medium white potato. 2 cups (150 g) raw leafy greens. Eat 1-2 cups of fruit per day. One cup of fruit is: 1 small apple 1 large banana 1 cup (237 g) mixed fruit, 1 large orange,  cup (82 g) dried fruit, 1 cup (240 mL) 100% fruit juice. Eat more foods that have soluble fiber. These are apples, broccoli, carrots, beans, peas, and barley. Try to get 20-30 g of fiber per day. Eat 4-5 servings of nuts, legumes, and seeds per week: 1 serving of dried beans or legumes equals  cup (90 g) cooked. 1 serving of nuts is  oz (12 almonds, 24 pistachios, or 7 walnut halves). 1 serving of seeds equals  oz (8 g). General information Eat more home-cooked food. Eat less restaurant, buffet, and fast food. Limit or avoid alcohol. Limit foods that are high in starch and sugar. Avoid fried foods. Lose weight if you are overweight. Keep track of how much salt (sodium) you eat. This is important if you have high blood pressure. Ask your doctor to tell you more about this. Try to add vegetarian meals each week. Fats Choose healthy fats. These include olive oil and canola oil, flaxseeds,  walnuts, almonds, and seeds. Eat more omega-3 fats. These include salmon, mackerel, sardines, tuna, flaxseed oil, and ground flaxseeds. Try to eat fish at least 2 times each week. Check food labels. Avoid foods with trans fats or high amounts of saturated fat. Limit saturated fats. These are often found in animal products, such as meats, butter, and cream. These are also found in plant foods, such as palm oil, palm kernel oil, and coconut oil. Avoid foods with partially hydrogenated oils in them. These have trans fats. Examples are stick margarine, some tub margarines, cookies, crackers, and other baked goods. What foods should I eat? Fruits All fresh, canned (in natural juice), or frozen fruits. Vegetables Fresh or frozen vegetables (raw, steamed, roasted, or grilled). Green salads. Grains Most grains. Choose whole wheat and whole grains most of the time. Rice and pasta, including brown rice and pastas made with whole wheat. Meats and other proteins Lean, well-trimmed beef, veal, pork, and lamb. Chicken and [place] without skin. All fish and shellfish. Wild duck, rabbit, pheasant, and venison. Egg whites or low-cholesterol egg substitutes. Dried beans, peas, lentils, and tofu. Seeds and most nuts. Dairy Low-fat or nonfat cheeses, including ricotta and mozzarella. Skim or 1%  milk that is liquid, powdered, or evaporated. Buttermilk that is made with low-fat milk. Nonfat or low-fat yogurt. Fats and oils Non-hydrogenated (trans-free) margarines. Vegetable oils, including soybean, sesame, sunflower, olive, peanut, safflower, corn, canola, and cottonseed. Salad dressings or mayonnaise made with a vegetable oil. Beverages Mineral water. Coffee and tea. Diet carbonated beverages. Sweets and desserts Sherbet, gelatin, and fruit ice. Small amounts of dark chocolate. Limit all sweets and desserts. Seasonings and condiments All seasonings and condiments. The items listed above may not be a complete  list of foods and drinks you can eat. Contact a dietitian for more options. What foods should I avoid? Fruits Canned fruit in heavy syrup. Fruit in cream or butter sauce. Fried fruit. Limit coconut. Vegetables Vegetables cooked in cheese, cream, or butter sauce. Fried vegetables. Grains Breads that are made with saturated or trans fats, oils, or whole milk. Croissants. Sweet rolls. Donuts. High-fat crackers, such as cheese crackers. Meats and other proteins Fatty meats, such as hot dogs, ribs, sausage, bacon, rib-eye roast or steak. High-fat deli meats, such as salami and bologna. Caviar. Domestic duck and goose. Organ meats, such as liver. Dairy Cream, sour cream, cream cheese, and creamed cottage cheese. Whole-milk cheeses. Whole or 2% milk that is liquid, evaporated, or condensed. Whole buttermilk. Cream sauce or high-fat cheese sauce. Yogurt that is made from whole milk. Fats and oils Meat fat, or shortening. Cocoa butter, hydrogenated oils, palm oil, coconut oil, palm kernel oil. Solid fats and shortenings, including bacon fat, salt pork, lard, and butter. Nondairy cream substitutes. Salad dressings with cheese or sour cream. Beverages Regular sodas and juice drinks with added sugar. Sweets and desserts Frosting. Pudding. Cookies. Cakes. Pies. Milk chocolate or white chocolate. Buttered syrups. Full-fat ice cream or ice cream drinks. The items listed above may not be a complete list of foods and drinks to avoid. Contact a dietitian for more information. Summary Heart-healthy meal planning includes eating less unhealthy fats, eating more healthy fats, and making other changes in your diet. Eat a balanced diet. This includes fruits and vegetables, low-fat or nonfat dairy, lean protein, nuts and legumes, whole grains, and heart-healthy oils and fats. This information is not intended to replace advice given to you by your health care provider. Make sure you discuss any questions you have with  your health care provider. Document Revised: 11/23/2021 Document Reviewed: 11/23/2021 Elsevier Patient Education  2024 [employer].

## 2023-07-06 MED ORDER — STIOLTO RESPIMAT 2.5-2.5 MCG/ACT IN AERS: 2.5000 ug | INHALATION_SPRAY | Freq: Every day | RESPIRATORY_TRACT | 3 refills | Status: DC

## 2023-07-06 NOTE — Telephone Encounter (Signed)
Stiolto rx sent.

## 2023-07-08 DIAGNOSIS — M79605 Pain in left leg: Secondary | ICD-10-CM | POA: Diagnosis not present

## 2023-07-14 DIAGNOSIS — M25562 Pain in left knee: Secondary | ICD-10-CM | POA: Diagnosis not present

## 2023-07-14 DIAGNOSIS — R2240 Localized swelling, mass and lump, unspecified lower limb: Secondary | ICD-10-CM | POA: Diagnosis not present

## 2023-07-14 DIAGNOSIS — M79605 Pain in left leg: Secondary | ICD-10-CM | POA: Diagnosis not present

## 2023-07-14 DIAGNOSIS — R2242 Localized swelling, mass and lump, left lower limb: Secondary | ICD-10-CM | POA: Diagnosis not present

## 2023-07-20 ENCOUNTER — Encounter (HOSPITAL_COMMUNITY): Payer: Self-pay | Admitting: Cardiology

## 2023-07-20 ENCOUNTER — Ambulatory Visit (HOSPITAL_COMMUNITY)
Admission: RE | Admit: 2023-07-20 | Discharge: 2023-07-20 | Disposition: A | Discharge status: Home / Self Care | Payer: Medicare PPO | Source: Ambulatory Visit | Attending: Cardiology | Admitting: Cardiology

## 2023-07-20 VITALS — BP 122/70 | HR 65 | Wt 113.4 lb

## 2023-07-20 DIAGNOSIS — Z7901 Long term (current) use of anticoagulants: Secondary | ICD-10-CM | POA: Insufficient documentation

## 2023-07-20 DIAGNOSIS — R0609 Other forms of dyspnea: Secondary | ICD-10-CM

## 2023-07-20 DIAGNOSIS — I10 Essential (primary) hypertension: Secondary | ICD-10-CM | POA: Diagnosis not present

## 2023-07-20 DIAGNOSIS — I4819 Other persistent atrial fibrillation: Secondary | ICD-10-CM | POA: Insufficient documentation

## 2023-07-20 DIAGNOSIS — I2510 Atherosclerotic heart disease of native coronary artery without angina pectoris: Secondary | ICD-10-CM | POA: Insufficient documentation

## 2023-07-20 DIAGNOSIS — Z951 Presence of aortocoronary bypass graft: Secondary | ICD-10-CM | POA: Insufficient documentation

## 2023-07-20 DIAGNOSIS — I361 Nonrheumatic tricuspid (valve) insufficiency: Secondary | ICD-10-CM | POA: Insufficient documentation

## 2023-07-20 DIAGNOSIS — I272 Pulmonary hypertension, unspecified: Secondary | ICD-10-CM | POA: Insufficient documentation

## 2023-07-20 DIAGNOSIS — R0602 Shortness of breath: Secondary | ICD-10-CM | POA: Diagnosis present

## 2023-07-20 DIAGNOSIS — I4821 Permanent atrial fibrillation: Secondary | ICD-10-CM

## 2023-07-20 LAB — BASIC METABOLIC PANEL WITH GFR
Anion gap: 9 (ref 5–15)
BUN: 18 mg/dL (ref 8–23)
CO2: 23 mmol/L (ref 22–32)
Calcium: 9.5 mg/dL (ref 8.9–10.3)
Chloride: 107 mmol/L (ref 98–111)
Creatinine, Ser: 0.96 mg/dL (ref 0.44–1.00)
GFR, Estimated: 57 mL/min — ABNORMAL LOW (ref >60)
Glucose, Bld: 96 mg/dL (ref 70–99)
Potassium: 4 mmol/L (ref 3.5–5.1)
Sodium: 139 mmol/L (ref 135–145)

## 2023-07-20 LAB — BRAIN NATRIURETIC PEPTIDE: B Natriuretic Peptide: 375.9 pg/mL — ABNORMAL HIGH (ref 0.0–100.0)

## 2023-07-20 LAB — DIGOXIN LEVEL: Digoxin Level: 0.5 ng/mL — ABNORMAL LOW (ref 0.8–2.0)

## 2023-07-20 MED ORDER — APIXABAN 2.5 MG PO TABS: 2.5000 mg | ORAL_TABLET | Freq: Two times a day (BID) | ORAL | 3 refills | Status: AC

## 2023-07-20 MED ORDER — FUROSEMIDE 20 MG PO TABS: 20.0000 mg | ORAL_TABLET | Freq: Two times a day (BID) | ORAL | 3 refills | Status: DC

## 2023-07-20 MED ORDER — SILDENAFIL CITRATE 20 MG PO TABS: 20.0000 mg | ORAL_TABLET | Freq: Two times a day (BID) | ORAL | 11 refills | Status: DC

## 2023-07-20 MED ORDER — DIGOXIN 125 MCG PO TABS: 0.0625 mg | ORAL_TABLET | Freq: Every day | ORAL | 3 refills | Status: DC

## 2023-07-20 NOTE — Progress Notes (Signed)
ADVANCED HEART FAILURE CLINIC NOTE  Referring Physician: Charlane Ferretti, DO  Primary Care: Charlane Ferretti, DO Primary Cardiologist: [name]  HPI: [name] is a 89 y.o. female with history of CABG in 2007 for left main disease by Dr. Dorris Fetch, persistent atrial fibrillation, hypertension, hyperlipidemia and history of hysterectomy in 2012 and recently diagnosed pulmonary hypertension and severe tricuspid regurgitation presenting today to establish care.  Despite her age, [name] has remained very active.  She continues to go bowling and square dancing however now reports becoming increasingly limited due to exertional dyspnea.  Due to shortness of breath, patient had an echocardiogram that demonstrated severe tricuspid regurgitation with a severely dilated right atrium and severely elevated PA systolic pressures.  She had a right heart cath which further confirmed a PVR of 3-4 Wood units.  Today she presents for follow up.   Interval history Despite having moderate mitral regurgitation she has had improvement in dyspnea and exertional capacity with the addition of low-dose sildenafil.  For the time being we will continue 10 mg 3 times daily.  She reports that she has started bowling again but continues to have some shortness of breath with moderate to excessive exertion.  She has some left lower extremity edema.  Had a left lower extremity vascular ultrasound and is now followed by vascular and wearing compression stockings.  Otherwise no syncopal events.  Lightheadedness is fairly well-controlled and only orthostatic.  Activity level/exercise tolerance: NYHA II-III Orthopnea:  Sleeps on 2 pillows Paroxysmal noctural dyspnea: No Chest pain/pressure: Yes but infrequent Orthostatic lightheadedness: No Palpitations: No Lower extremity edema: 1-2+ on the left lower extremity. Presyncope/syncope: No Cough: No  Past Medical History:  Diagnosis Date   Chronic atrial fibrillation  (HCC)    a. Pt declined cardioversion.   Coronary artery disease    a. s/p CABGx2 06/2006 (LIMA-LAD, SVG-LCx at bifurcation of OM II/III). b. Nuc 11/2012: no evidence of ischemia, attenuation artifact c/w breast attenuation in anterior region, essentially low risk, EF 88%.   Essential hypertension    Hyperlipidemia    Hypothyroidism    Insomnia    on ambien, failed other meds   Iron deficiency anemia    Osteoarthritis    Osteopenia    involved on BMD 06/01/07 (T score-1.6); slightly worse in 3/13 (T score -1.6; FRAX 3%  and 12%)-plan rechcekin 2016   Uterine prolapse    post hysterectomy, Dr. Vincente Poli   UTI (lower urinary tract infection) 3/11    Current Outpatient Medications  Medication Sig Dispense Refill   acetaminophen (TYLENOL) 500 MG tablet Take 500 mg by mouth daily. Am and  early afternoon 1 tablet     alendronate (FOSAMAX) 70 MG tablet Take 70 mg by mouth once a week.      atorvastatin (LIPITOR) 20 MG tablet TAKE 1 TABLET BY MOUTH EVERY DAY 90 tablet 2   Bioflavonoid Products (ESTER-C) TABS Take 1 tablet by mouth daily.     BIOTIN PO Take 10,000 mg by mouth daily.     bisoprolol (ZEBETA) 5 MG tablet Take 2.5 mg by mouth daily.     Calcium Carbonate Antacid (CALCIUM CARBONATE PO) Take 1 tablet by mouth 2 (two) times daily.     digoxin (LANOXIN) 0.125 MG tablet Take 0.5 tablets (0.0625 mg total) by mouth daily. 45 tablet 3   ELIQUIS 2.5 MG TABS tablet TAKE 1 TABLET BY MOUTH TWICE A DAY 180 tablet 1   famotidine (PEPCID) 20 MG tablet Take 20 mg  by mouth at bedtime.     ferrous sulfate 325 (65 FE) MG tablet Take 1 tablet (325 mg total) by mouth daily.     furosemide (LASIX) 20 MG tablet Take 1 tablet (20 mg total) by mouth 2 (two) times daily. 180 tablet 3   Hypromellose (ARTIFICIAL TEARS OP) Place 1 drop into both eyes every 6 (six) hours as needed (dry eyes).     levothyroxine (SYNTHROID, LEVOTHROID) 75 MCG tablet Take 37.5-75 mcg by mouth See admin instructions. Take 75 mg  6  days a week and 37.5 mg every Friday in the morning before breakfast     Misc Natural Products (OSTEO BI-FLEX ADV JOINT SHIELD PO) Take 1 tablet by mouth 2 (two) times daily.      nitroGLYCERIN (NITROSTAT) 0.4 MG SL tablet Place 1 tablet (0.4 mg total) under the tongue every 5 (five) minutes as needed for chest pain. 25 tablet 0   Omega-3 Fatty Acids (FISH OIL) 1200 MG CAPS Take 1,200 mg by mouth 2 (two) times daily.     senna (SENNA LAX) 8.6 MG tablet Take 1 tablet (8.6 mg total) by mouth daily.     sildenafil (REVATIO) 20 MG tablet Take 20 mg by mouth 2 (two) times daily.     Tiotropium Bromide-Olodaterol (STIOLTO RESPIMAT) 2.5-2.5 MCG/ACT AERS Inhale 2.5 mcg into the lungs daily at 6 (six) AM. INHALE 2 PUFFS BY MOUTH INTO THE LUNGS DAILY 12 g 3   vitamin B-12 (CYANOCOBALAMIN) 1000 MCG tablet Take 1,000 mcg by mouth daily.     zolpidem (AMBIEN) 5 MG tablet Take 5 mg by mouth at bedtime.     No current facility-administered medications for this encounter.    Allergies  Allergen Reactions   Sulfa Antibiotics Rash      Social History   Socioeconomic History   Marital status: Widowed    Spouse name: Not on file   Number of children: Not on file   Years of education: Not on file   Highest education level: Not on file  Occupational History   Occupation: retired  Tobacco Use   Smoking status: Never    Passive exposure: Past   Smokeless tobacco: Never  Vaping Use   Vaping status: Never Used  Substance and Sexual Activity   Alcohol use: No    Alcohol/week: 0.0 standard drinks of alcohol   Drug use: No   Sexual activity: Not Currently  Other Topics Concern   Not on file  Social History Narrative   Not on file   Social Determinants of Health   Financial Resource Strain: Not on file  Food Insecurity: Not on file  Transportation Needs: Not on file  Physical Activity: Not on file  Stress: Not on file  Social Connections: Not on file  Intimate Partner Violence: Not on file       Family History  Problem Relation Age of Onset   Other Mother    Heart disease Mother    Syncope episode Mother    Ulcers Mother    Heart disease Father    Heart attack Father    CAD Father    Diabetes Father    Lymphoma Sister    Cancer Brother        throat   Brain cancer Brother    Throat cancer Brother    Diabetes Brother    Diabetes type II Son    Colon cancer Neg Hx    Esophageal cancer Neg Hx    Stomach cancer  Neg Hx    Pancreatic cancer Neg Hx     PHYSICAL EXAM: Vitals:   07/20/23 1524  BP: 122/70  Pulse: 65  SpO2: 97%   GENERAL: Well nourished, well developed, and in no apparent distress at rest.  HEENT: Negative for arcus senilis or xanthelasma. There is no scleral icterus.  The mucous membranes are pink and moist.   NECK: Supple, No masses. Normal carotid upstrokes without bruits. No masses or thyromegaly.    CHEST: There are no chest wall deformities. There is no chest wall tenderness. Respirations are unlabored.  Lungs-CTA bilaterally CARDIAC:  JVP: 7 cm          Normal rate with regular rhythm.  3 out of 6 systolic ejection murmur  rubs or gallops.  Pulses are 2+ and symmetrical in upper and lower extremities.  1+ on the left, no edema on the right lower extremity ABDOMEN: Soft, non-tender, non-distended. There are no masses or hepatomegaly. There are normal bowel sounds.  EXTREMITIES: Warm and well perfused with no cyanosis, clubbing.  LYMPHATIC: No axillary or supraclavicular lymphadenopathy.  NEUROLOGIC: Patient is oriented x3 with no focal or lateralizing neurologic deficits.  PSYCH: Patients affect is appropriate, there is no evidence of anxiety or depression.  SKIN: Warm and dry; no lesions or wounds.      DATA REVIEW  ECG: 02/25/23: Atrial fibrillation  As per my personal interpretation  ECHO: 02/08/23 LVEF 55%, mildly reduced RV, severely dilated RA. severe TR, moderate MR,  As per my personal interpretation  CATH: 02/17/23:   Hemodynamic findings consistent with mild to moderate pulmonary hypertension.   Aortic saturation 98% noninvasively.  PA saturation 65%.  RA pressure 20/30, mean RA pressure 18 mmHg, RV pressure 58/3, PA pressure 57/19, mean PA pressure 34 mmHg, pulmonary capillary wedge pressure 18/29, mean pulmonary capillary wedge pressure 19.  Cardiac output 4.3 L/min, cardiac index 2.85, calculated PVR 3.48.   prominent V waves noted on the wedge tracing.   RA waveform suggestive of significant tricuspid regurgitation.   02/25/23: Pt ambulated 228.6 meters O2 Sat ranged 99 on 96 on room air HR ranged 77-86    ASSESSMENT & PLAN:  Pulmonary Hypertension, severe tricuspid regurgitation, RV dysfunction -Echocardiogram demonstrates severely dilated right atrium with severe torrential tricuspid regurgitation due to poor tricuspid leaflet coaptation.  She has had a right heart cath with a PVR of 3.5 Wood units, RA pressure of 20-30 with large V waves and a PA mean of 34 mmHg. -Repeat REDS today.  -She does have PFTs from 2017 that demonstrate obstructive lung disease that is at least moderate from secondhand smoking exposure.  6-minute walk test today at last appointment.  I had a lengthy discussion with her today regarding various medication combinations that we can try to improve her functional status.  - Continue sildenafil 10mg  TID; despite having underlying mitral regurgitation she has had improvement in dyspnea with the addition of sildenafil.  I believe this is likely due to reduction in TR /RV afterload.  I have asked her to keep a blood pressure log for the next 1 week.  Will consider adding on low-dose losartan for afterload reduction for her mitral regurgitation if she can tolerate it. - CT chest (03/24/23) w/o significant parenchymal lung disease.  - Continue digoxin 0.0630mcg - Continue bisoprolol to 2.5mg , limited by low heart rates. - Euvolemic on exam; currently taking 20mg  BID  2.   Persistent atrial fibrillation -Continue bisoprolol -apixaban 2.5mg  BID  3. CAD s/p  CABG - followed by Dr. Ardean Larsen Advanced Heart Failure Mechanical Circulatory Support

## 2023-07-20 NOTE — Patient Instructions (Signed)
   Labs done today, your results will be available in MyChart, we will contact you for abnormal readings.  Your physician has requested that you have an echocardiogram. Echocardiography is a painless test that uses sound waves to create images of your heart. It provides your doctor with information about the size and shape of your heart and how well your heart's chambers and valves are working. This procedure takes approximately one hour. There are no restrictions for this procedure. Please do NOT wear cologne, perfume, aftershave, or lotions (deodorant is allowed). Please arrive 15 minutes prior to your appointment time.  Your physician recommends that you schedule a follow-up appointment in: 3 months  If you have any questions or concerns before your next appointment please send [place] a message through [place] or call our office at [phone].    TO LEAVE A MESSAGE FOR THE NURSE SELECT OPTION 2, PLEASE LEAVE A MESSAGE INCLUDING: YOUR NAME DATE OF BIRTH CALL BACK NUMBER REASON FOR CALL**this is important as we prioritize the call backs  YOU WILL RECEIVE A CALL BACK THE SAME DAY AS LONG AS YOU CALL BEFORE 4:00 PM  At the [facility], you and your health needs are our priority. As part of our continuing mission to provide you with exceptional heart care, we have created designated Provider Care Teams. These Care Teams include your primary Cardiologist (physician) and Advanced Practice Providers (APPs- Physician Assistants and Nurse Practitioners) who all work together to provide you with the care you need, when you need it.   You may see any of the following providers on your designated Care Team at your next follow up: Dr Arvilla Meres Dr Marca Ancona Dr. Marcos Eke, NP Robbie Lis, [place] [facility] [place], [place] Brynda Peon, NP Karle Plumber, PharmD   Please be sure to bring in all your medications bottles to every appointment.     Thank you for choosing [facility]

## 2023-08-11 ENCOUNTER — Ambulatory Visit (INDEPENDENT_AMBULATORY_CARE_PROVIDER_SITE_OTHER): Payer: Medicare PPO | Admitting: Pulmonary Disease

## 2023-08-11 ENCOUNTER — Encounter: Payer: Self-pay | Admitting: Pulmonary Disease

## 2023-08-11 VITALS — BP 124/81 | HR 58 | Ht 62.0 in | Wt 112.2 lb

## 2023-08-11 DIAGNOSIS — J449 Chronic obstructive pulmonary disease, unspecified: Secondary | ICD-10-CM

## 2023-08-11 NOTE — Patient Instructions (Signed)
VISIT SUMMARY:  During your recent visit, we discussed your ongoing issues with pulmonary hypertension, chronic obstructive pulmonary disease (COPD), and congestive heart failure (CHF). You mentioned experiencing shortness of breath with minimal exertion and leg swelling. We reviewed your current medications and treatment plans for these conditions.  YOUR PLAN:  -PULMONARY HYPERTENSION: Pulmonary hypertension is a type of high blood pressure that affects the arteries in your lungs and the right side of your heart. You are currently managed with Sildenafil, which you are taking at a half dose due to dizziness. We will continue this management under your cardiologist.  -CHRONIC OBSTRUCTIVE PULMONARY DISEASE (COPD): COPD is a chronic inflammatory lung disease that causes obstructed airflow from the lungs. You are using a Stiolto inhaler for this condition. We will continue with this treatment and encourage you to continue physical activity as tolerated.  -CONGESTIVE HEART FAILURE (CHF): CHF is a condition in which your heart doesn't pump blood as well as it should. You are managing this with Lasix 20mg  twice daily and the use of compression stockings for your leg swelling. We will continue with this treatment.  INSTRUCTIONS:  Please continue with your current medications and treatments as discussed. We will follow up on your condition in 6 months. If you experience any changes in your symptoms or have any concerns, please contact the office.

## 2023-08-11 NOTE — Progress Notes (Signed)
[name]    [id number]    [birth year]  Primary Care Physician:Skakle, Eliberto Ivory DO  Referring Physician: Charlane Ferretti, DO [address] [place],  [place] [address]  Chief complaint: Follow-up for COPD  HPI: 89 y.o. -old with COPD.  She states that she is doing well with inhaler regimen.  Complains of mild dyspnea on exertion.  No cough, sputum production, fevers, chills.  Pets: Has a dog and a cat.  No birds, farm animals Occupation: Retired Chartered loss adjuster. Exposures: No known exposures, no mold, hot tub, Jacuzzi Smoking history: Never smoker Travel history: No significant travel history Relevant family history: No significant family history of lung disease  Interim history: Discussed the use of AI scribe software for clinical note transcription with the patient, who gave verbal consent to proceed.  The patient, with a history of pulmonary hypertension and moderate COPD, presents for follow-up. She reports shortness of breath with minimal exertion, such as walking her dog, but not while bowling. She is currently managed medications for pulmonary hypertension, including sildenafil, which initially caused dizziness, so she is taking a half dose. Follows with Dr. Gasper Lloyd, Cardiology  She also uses a Stiolto inhaler for COPD. She also reports leg swelling, for which she is taking Lasix twice daily and wearing compression stockings.   Outpatient Encounter Medications as of 08/11/2023  Medication Sig   acetaminophen (TYLENOL) 500 MG tablet Take 500 mg by mouth daily. Am and  early afternoon 1 tablet   alendronate (FOSAMAX) 70 MG tablet Take 70 mg by mouth once a week.    apixaban (ELIQUIS) 2.5 MG TABS tablet Take 1 tablet (2.5 mg total) by mouth 2 (two) times daily.   atorvastatin (LIPITOR) 20 MG tablet TAKE 1 TABLET BY MOUTH EVERY DAY   Bioflavonoid Products (ESTER-C) TABS Take 1 tablet by mouth daily.   BIOTIN PO Take 10,000 mg by mouth daily.   bisoprolol (ZEBETA) 5 MG  tablet Take 2.5 mg by mouth daily.   Calcium Carbonate Antacid (CALCIUM CARBONATE PO) Take 1 tablet by mouth 2 (two) times daily.   digoxin (LANOXIN) 0.125 MG tablet Take 0.5 tablets (0.0625 mg total) by mouth daily.   famotidine (PEPCID) 20 MG tablet Take 20 mg by mouth at bedtime.   ferrous sulfate 325 (65 FE) MG tablet Take 1 tablet (325 mg total) by mouth daily.   furosemide (LASIX) 20 MG tablet Take 1 tablet (20 mg total) by mouth 2 (two) times daily.   Hypromellose (ARTIFICIAL TEARS OP) Place 1 drop into both eyes every 6 (six) hours as needed (dry eyes).   levothyroxine (SYNTHROID, LEVOTHROID) 75 MCG tablet Take 37.5-75 mcg by mouth See admin instructions. Take 75 mg  6 days a week and 37.5 mg every Friday in the morning before breakfast   Misc Natural Products (OSTEO BI-FLEX ADV JOINT SHIELD PO) Take 1 tablet by mouth 2 (two) times daily.    nitroGLYCERIN (NITROSTAT) 0.4 MG SL tablet Place 1 tablet (0.4 mg total) under the tongue every 5 (five) minutes as needed for chest pain.   Omega-3 Fatty Acids (FISH OIL) 1200 MG CAPS Take 1,200 mg by mouth 2 (two) times daily.   senna (SENNA LAX) 8.6 MG tablet Take 1 tablet (8.6 mg total) by mouth daily.   sildenafil (REVATIO) 20 MG tablet Take 1 tablet (20 mg total) by mouth 2 (two) times daily.   Tiotropium Bromide-Olodaterol (STIOLTO RESPIMAT) 2.5-2.5 MCG/ACT AERS Inhale 2.5 mcg into the lungs daily  at 6 (six) AM. INHALE 2 PUFFS BY MOUTH INTO THE LUNGS DAILY   vitamin B-12 (CYANOCOBALAMIN) 1000 MCG tablet Take 1,000 mcg by mouth daily.   zolpidem (AMBIEN) 5 MG tablet Take 5 mg by mouth at bedtime.   No facility-administered encounter medications on file as of 08/11/2023.   Physical Exam: Blood pressure 118/60, pulse 74, temperature 97.7 F (36.5 C), temperature source Oral, height 5' 2.5" (1.588 m), weight 108 lb (49 kg), SpO2 98 %. Gen:      No acute distress HEENT:  EOMI, sclera anicteric Neck:     No masses; no thyromegaly Lungs:    Clear  to auscultation bilaterally; normal respiratory effort CV:         Regular rate and rhythm; no murmurs Abd:      + bowel sounds; soft, non-tender; no palpable masses, no distension Ext:    No edema; adequate peripheral perfusion Skin:      Warm and dry; no rash Neuro: alert and oriented x 3 Psych: normal mood and affect   Data Reviewed: Imaging: CT chest 10/31/2015 mild bibasilar atelectasis, scarring. Chest x-ray 02/25/2021-hyperinflation consistent with COPD. CT chest 03/10/2022-right lower lobe scarring which is stable since 2016.  No acute abnormalities. CT chest 03/30/2023-small bilateral pleural effusion right greater than left, chronic rounded atelectasis I have reviewed the images personally.  PFTs: 12/17/2015 FVC 2.48 [120%], FEV1 1.09 [72%], F/F 44, TLC 103%, DLCO 68% Moderate obstruction.  Disproportionately blunted expiratory flow loop Minimal diffusion defect  Labs: CBC 08/20/2016-WBC 5.4, eos 3%, absolute eosinophil count 162 CBC 11/22/2018-WBC 4.5, eos 3.9%, absolute eosinophil count 175 IgE 11/22/2018-7,   Alpha-1 antitrypsin 11/22/2018-144, PIMS Alpha-1 antitrypsin 02/25/2021- 171  Cardiac: Echocardiogram 11/25/2015- LVEF 50-55%, PA peak pressure 45 mm. Nuclear stress test 11/25/2015- normal study with no ischemia.  Assessment:  Moderate COPD She is a never smoker.  Has a diagnosis of COPD on record Not sure if the obstruction on the PFTs is accurate as there is a abnormal, disproportionately blunted expiratory flow loop suggestive of poor effort.  I have suggested to her that we can repeat the PFTs or spirometry but she does not want to go to the procedure again  Continue stiolto  Pleural effusion She has bilateral effusions with partial loculations on the right.  Does not appear infected.  Suspect this may be from CHF Continue Lasix.  Will defer thoracentesis as we want avoid invasive procedures  Alpha-1 antitrypsin heterozygote Has normal levels with PI MS  phenotype.  We will continue monitoring.  Plan/Recommendations: Continue stiolto  Chilton Greathouse MD [facility] 08/11/2023, 10:56 AM  CC: Charlane Ferretti, DO

## 2023-09-14 ENCOUNTER — Ambulatory Visit (HOSPITAL_COMMUNITY)
Admission: RE | Admit: 2023-09-14 | Discharge: 2023-09-14 | Disposition: A | Discharge status: Home / Self Care | Payer: Medicare PPO | Source: Ambulatory Visit | Attending: Cardiology | Admitting: Cardiology

## 2023-09-14 ENCOUNTER — Encounter (HOSPITAL_COMMUNITY): Payer: Self-pay | Admitting: Cardiology

## 2023-09-14 VITALS — BP 110/80 | HR 63 | Wt 116.0 lb

## 2023-09-14 DIAGNOSIS — I2510 Atherosclerotic heart disease of native coronary artery without angina pectoris: Secondary | ICD-10-CM | POA: Insufficient documentation

## 2023-09-14 DIAGNOSIS — J449 Chronic obstructive pulmonary disease, unspecified: Secondary | ICD-10-CM | POA: Diagnosis not present

## 2023-09-14 DIAGNOSIS — I4819 Other persistent atrial fibrillation: Secondary | ICD-10-CM | POA: Insufficient documentation

## 2023-09-14 DIAGNOSIS — I272 Pulmonary hypertension, unspecified: Secondary | ICD-10-CM | POA: Diagnosis not present

## 2023-09-14 DIAGNOSIS — Z7722 Contact with and (suspected) exposure to environmental tobacco smoke (acute) (chronic): Secondary | ICD-10-CM | POA: Insufficient documentation

## 2023-09-14 DIAGNOSIS — Z79899 Other long term (current) drug therapy: Secondary | ICD-10-CM | POA: Diagnosis not present

## 2023-09-14 DIAGNOSIS — I4821 Permanent atrial fibrillation: Secondary | ICD-10-CM

## 2023-09-14 DIAGNOSIS — R0602 Shortness of breath: Secondary | ICD-10-CM | POA: Insufficient documentation

## 2023-09-14 DIAGNOSIS — I081 Rheumatic disorders of both mitral and tricuspid valves: Secondary | ICD-10-CM | POA: Insufficient documentation

## 2023-09-14 DIAGNOSIS — Z7901 Long term (current) use of anticoagulants: Secondary | ICD-10-CM | POA: Insufficient documentation

## 2023-09-14 DIAGNOSIS — E785 Hyperlipidemia, unspecified: Secondary | ICD-10-CM | POA: Insufficient documentation

## 2023-09-14 DIAGNOSIS — I10 Essential (primary) hypertension: Secondary | ICD-10-CM | POA: Insufficient documentation

## 2023-09-14 DIAGNOSIS — Z951 Presence of aortocoronary bypass graft: Secondary | ICD-10-CM | POA: Insufficient documentation

## 2023-09-14 DIAGNOSIS — I071 Rheumatic tricuspid insufficiency: Secondary | ICD-10-CM

## 2023-09-14 DIAGNOSIS — R059 Cough, unspecified: Secondary | ICD-10-CM | POA: Diagnosis not present

## 2023-09-14 LAB — BASIC METABOLIC PANEL
Anion gap: 8 (ref 5–15)
BUN: 20 mg/dL (ref 8–23)
CO2: 24 mmol/L (ref 22–32)
Calcium: 9.5 mg/dL (ref 8.9–10.3)
Chloride: 108 mmol/L (ref 98–111)
Creatinine, Ser: 0.8 mg/dL (ref 0.44–1.00)
GFR, Estimated: >60 mL/min (ref >60)
Glucose, Bld: 88 mg/dL (ref 70–99)
Potassium: 3.7 mmol/L (ref 3.5–5.1)
Sodium: 140 mmol/L (ref 135–145)

## 2023-09-14 LAB — BRAIN NATRIURETIC PEPTIDE: B Natriuretic Peptide: 365 pg/mL — ABNORMAL HIGH (ref 0.0–100.0)

## 2023-09-14 MED ORDER — POTASSIUM CHLORIDE CRYS ER 20 MEQ PO TBCR: 20.0000 meq | EXTENDED_RELEASE_TABLET | Freq: Every day | ORAL | 0 refills | Status: DC

## 2023-09-14 MED ORDER — FUROSEMIDE 20 MG PO TABS: ORAL_TABLET | ORAL | 3 refills | Status: DC

## 2023-09-14 NOTE — Patient Instructions (Signed)
INCREASE Lasix to 40 mg twice a day for 3-5 days (until edema/swelling improves) then 20 mg daily thereafter  START Potassium for 3-5 days with increased dose with lasix  Labs today We will only contact you if something comes back abnormal or we need to make some changes. Otherwise no news is good news!  Your physician recommends that you schedule a follow-up appointment in: 1 week with Dr Gasper Lloyd at [facility]   Do the following things EVERYDAY: Weigh yourself in the morning before breakfast. Write it down and keep it in a log. Take your medicines as prescribed Eat low salt foods--Limit salt (sodium) to 2000 mg per day.  Stay as active as you can everyday Limit all fluids for the day to less than 2 liters  At the [facility], you and your health needs are our priority. As part of our continuing mission to provide you with exceptional heart care, we have created designated Provider Care Teams. These Care Teams include your primary Cardiologist (physician) and Advanced Practice Providers (APPs- Physician Assistants and Nurse Practitioners) who all work together to provide you with the care you need, when you need it.   You may see any of the following providers on your designated Care Team at your next follow up: Dr Arvilla Meres Dr Marca Ancona Dr. Dorthula Nettles Dr. Clearnce Hasten Amy Filbert Schilder, NP Robbie Lis, [place] [facility] [place], [place] Brynda Peon, NP [place] Lee, NP Karle Plumber, PharmD   Please be sure to bring in all your medications bottles to every appointment.    Thank you for choosing [facility]   If you have any questions or concerns before your next appointment please send [place] a message through [place] or call our office at [phone].    TO LEAVE A MESSAGE FOR THE NURSE SELECT OPTION 2, PLEASE LEAVE A MESSAGE INCLUDING: YOUR NAME DATE OF BIRTH CALL BACK NUMBER REASON FOR  CALL**this is important as we prioritize the call backs  YOU WILL RECEIVE A CALL BACK THE SAME DAY AS LONG AS YOU CALL BEFORE 4:00 PM '

## 2023-09-14 NOTE — Progress Notes (Signed)
ADVANCED HEART FAILURE CLINIC NOTE  Referring Physician: Charlane Ferretti, DO  Primary Care: Charlane Ferretti, DO Primary Cardiologist: [name]  HPI: [name] is a 89 y.o. female with history of CABG in 2007 for left main disease by Dr. Dorris Fetch, persistent atrial fibrillation, hypertension, hyperlipidemia and history of hysterectomy in 2012 and recently diagnosed pulmonary hypertension and severe tricuspid regurgitation presenting today to establish care.  Despite her age, [name] has remained very active.  She continues to go bowling and square dancing however now reports becoming increasingly limited due to exertional dyspnea.  Due to shortness of breath, patient had an echocardiogram that demonstrated severe tricuspid regurgitation with a severely dilated right atrium and severely elevated PA systolic pressures.  She had a right heart cath which further confirmed a PVR of 3-4 Wood units.    She presents for follow up today. She has continued to go bowling, reports her team is in first place. She did become sick with a viral URI roughly 1-2 weeks ago that has led to progressively worsening fatigue and volume overload. Over the past 2 days she has developed a cough and shortness of breath with lower extremity edema.   Activity level/exercise tolerance: NYHA III Orthopnea:  Sleeps on 2 pillows Paroxysmal noctural dyspnea: No Chest pain/pressure: No Orthostatic lightheadedness: No Palpitations: No Lower extremity edema: yes 2+ bilaterally Presyncope/syncope: No Cough: No  Past Medical History:  Diagnosis Date   Chronic atrial fibrillation (HCC)    a. Pt declined cardioversion.   Coronary artery disease    a. s/p CABGx2 06/2006 (LIMA-LAD, SVG-LCx at bifurcation of OM II/III). b. Nuc 11/2012: no evidence of ischemia, attenuation artifact c/w breast attenuation in anterior region, essentially low risk, EF 88%.   Essential hypertension    Hyperlipidemia    Hypothyroidism     Insomnia    on ambien, failed other meds   Iron deficiency anemia    Osteoarthritis    Osteopenia    involved on BMD 06/01/07 (T score-1.6); slightly worse in 3/13 (T score -1.6; FRAX 3%  and 12%)-plan rechcekin 2016   Uterine prolapse    post hysterectomy, Dr. Vincente Poli   UTI (lower urinary tract infection) 3/11    Current Outpatient Medications  Medication Sig Dispense Refill   acetaminophen (TYLENOL) 500 MG tablet Take 500 mg by mouth daily. Am and  early afternoon 1 tablet     alendronate (FOSAMAX) 70 MG tablet Take 70 mg by mouth once a week.      apixaban (ELIQUIS) 2.5 MG TABS tablet Take 1 tablet (2.5 mg total) by mouth 2 (two) times daily. 180 tablet 3   atorvastatin (LIPITOR) 20 MG tablet TAKE 1 TABLET BY MOUTH EVERY DAY 90 tablet 2   Bioflavonoid Products (ESTER-C) TABS Take 1 tablet by mouth daily.     BIOTIN PO Take 10,000 mg by mouth daily.     bisoprolol (ZEBETA) 5 MG tablet Take 2.5 mg by mouth daily.     Calcium Carbonate Antacid (CALCIUM CARBONATE PO) Take 1 tablet by mouth 2 (two) times daily.     digoxin (LANOXIN) 0.125 MG tablet Take 0.5 tablets (0.0625 mg total) by mouth daily. 45 tablet 3   famotidine (PEPCID) 20 MG tablet Take 20 mg by mouth at bedtime.     ferrous sulfate 325 (65 FE) MG tablet Take 1 tablet (325 mg total) by mouth daily.     furosemide (LASIX) 20 MG tablet Take 1 tablet (20 mg total) by mouth 2 (  two) times daily. 180 tablet 3   Hypromellose (ARTIFICIAL TEARS OP) Place 1 drop into both eyes every 6 (six) hours as needed (dry eyes).     levothyroxine (SYNTHROID, LEVOTHROID) 75 MCG tablet Take 37.5-75 mcg by mouth See admin instructions. Take 75 mg  6 days a week and 37.5 mg every Friday in the morning before breakfast     Misc Natural Products (OSTEO BI-FLEX ADV JOINT SHIELD PO) Take 1 tablet by mouth 2 (two) times daily.      nitroGLYCERIN (NITROSTAT) 0.4 MG SL tablet Place 1 tablet (0.4 mg total) under the tongue every 5 (five) minutes as needed for  chest pain. 25 tablet 0   Omega-3 Fatty Acids (FISH OIL) 1200 MG CAPS Take 1,200 mg by mouth 2 (two) times daily.     senna (SENNA LAX) 8.6 MG tablet Take 1 tablet (8.6 mg total) by mouth daily.     sildenafil (REVATIO) 20 MG tablet Take 1 tablet (20 mg total) by mouth 2 (two) times daily. 60 tablet 11   Tiotropium Bromide-Olodaterol (STIOLTO RESPIMAT) 2.5-2.5 MCG/ACT AERS Inhale 2.5 mcg into the lungs daily at 6 (six) AM. INHALE 2 PUFFS BY MOUTH INTO THE LUNGS DAILY 12 g 3   vitamin B-12 (CYANOCOBALAMIN) 1000 MCG tablet Take 1,000 mcg by mouth daily.     zolpidem (AMBIEN) 5 MG tablet Take 5 mg by mouth at bedtime.     No current facility-administered medications for this visit.    Allergies  Allergen Reactions   Sulfa Antibiotics Rash      Social History   Socioeconomic History   Marital status: Widowed    Spouse name: Not on file   Number of children: Not on file   Years of education: Not on file   Highest education level: Not on file  Occupational History   Occupation: retired  Tobacco Use   Smoking status: Never    Passive exposure: Past   Smokeless tobacco: Never  Vaping Use   Vaping status: Never Used  Substance and Sexual Activity   Alcohol use: No    Alcohol/week: 0.0 standard drinks of alcohol   Drug use: No   Sexual activity: Not Currently  Other Topics Concern   Not on file  Social History Narrative   Not on file   Social Determinants of Health   Financial Resource Strain: Not on file  Food Insecurity: Not on file  Transportation Needs: Not on file  Physical Activity: Not on file  Stress: Not on file  Social Connections: Not on file  Intimate Partner Violence: Not on file      Family History  Problem Relation Age of Onset   Other Mother    Heart disease Mother    Syncope episode Mother    Ulcers Mother    Heart disease Father    Heart attack Father    CAD Father    Diabetes Father    Lymphoma Sister    Cancer Brother        throat   Brain  cancer Brother    Throat cancer Brother    Diabetes Brother    Diabetes type II Son    Colon cancer Neg Hx    Esophageal cancer Neg Hx    Stomach cancer Neg Hx    Pancreatic cancer Neg Hx     PHYSICAL EXAM: Vitals:   09/14/23 1110  BP: 110/80  Pulse: 63  SpO2: 95%   GENERAL: Well nourished, well developed, and in  no apparent distress at rest.  HEENT: Negative for arcus senilis or xanthelasma. There is no scleral icterus.  The mucous membranes are pink and moist.   NECK: Supple, No masses. Normal carotid upstrokes without bruits. No masses or thyromegaly.    CHEST: There are no chest wall deformities. There is no chest wall tenderness. Respirations are unlabored.  Lungs- CTA B/L CARDIAC:  JVP: 12 cm          Normal rate with regular rhythm. No murmurs, rubs or gallops.  Pulses are 2+ and symmetrical in upper and lower extremities. 2+ edema.  ABDOMEN: Soft, non-tender, non-distended. There are no masses or hepatomegaly. There are normal bowel sounds.  EXTREMITIES: Warm and well perfused with no cyanosis, clubbing.  LYMPHATIC: No axillary or supraclavicular lymphadenopathy.  NEUROLOGIC: Patient is oriented x3 with no focal or lateralizing neurologic deficits.  PSYCH: Patients affect is appropriate, there is no evidence of anxiety or depression.  SKIN: Warm and dry; no lesions or wounds.       DATA REVIEW  ECG: 02/25/23: Atrial fibrillation  As per my personal interpretation  ECHO: 02/08/23 LVEF 55%, mildly reduced RV, severely dilated RA. severe TR, moderate MR,  As per my personal interpretation  CATH: 02/17/23:  Hemodynamic findings consistent with mild to moderate pulmonary hypertension.   Aortic saturation 98% noninvasively.  PA saturation 65%.  RA pressure 20/30, mean RA pressure 18 mmHg, RV pressure 58/3, PA pressure 57/19, mean PA pressure 34 mmHg, pulmonary capillary wedge pressure 18/29, mean pulmonary capillary wedge pressure 19.  Cardiac output 4.3 L/min, cardiac  index 2.85, calculated PVR 3.48.   prominent V waves noted on the wedge tracing.   RA waveform suggestive of significant tricuspid regurgitation.   02/25/23: Pt ambulated 228.6 meters O2 Sat ranged 99 on 96 on room air HR ranged 77-86    ASSESSMENT & PLAN:  Pulmonary Hypertension, severe tricuspid regurgitation, RV dysfunction -Echocardiogram demonstrates severely dilated right atrium with severe torrential tricuspid regurgitation due to poor tricuspid leaflet coaptation.  She has had a right heart cath with a PVR of 3.5 Wood units, RA pressure of 20-30 with large V waves and a PA mean of 34 mmHg. -Repeat REDS today.  -She does have PFTs from 2017 that demonstrate obstructive lung disease that is at least moderate from secondhand smoking exposure.  6-minute walk test today at last appointment.  I had a lengthy discussion with her today regarding various medication combinations that we can try to improve her functional status.  - Continue sildenafil 10mg  TID; despite having underlying mitral regurgitation she has had improvement in dyspnea with the addition of sildenafil.  I believe this is likely due to reduction in TR /RV afterload.  I have asked her to keep a blood pressure log for the next 1 week.  Will consider adding on low-dose losartan for afterload reduction for her mitral regurgitation if she can tolerate it. - CT chest (03/24/23) w/o significant parenchymal lung disease.  - Continue digoxin 0.0619mcg - Continue bisoprolol to 2.5mg , limited by low heart rates. - Hypervolemic on exam; increase lasix to 40mg  BID x 3-4 days. Will follow up in 1 week. BMP/BNP today.   2.  Persistent atrial fibrillation -Continue bisoprolol -apixaban 2.5mg  BID -rate controlled today; asymptomatic.   3. CAD s/p CABG - followed by [name] -No CP.    Markeis Allman Advanced Heart Failure Mechanical Circulatory Support

## 2023-09-20 ENCOUNTER — Ambulatory Visit: Payer: Medicare PPO | Attending: Cardiology | Admitting: Cardiology

## 2023-09-20 VITALS — BP 138/72 | HR 60 | Wt 110.2 lb

## 2023-09-20 DIAGNOSIS — J449 Chronic obstructive pulmonary disease, unspecified: Secondary | ICD-10-CM | POA: Diagnosis not present

## 2023-09-20 DIAGNOSIS — I272 Pulmonary hypertension, unspecified: Secondary | ICD-10-CM | POA: Diagnosis not present

## 2023-09-20 DIAGNOSIS — Z79899 Other long term (current) drug therapy: Secondary | ICD-10-CM | POA: Diagnosis not present

## 2023-09-20 DIAGNOSIS — I110 Hypertensive heart disease with heart failure: Secondary | ICD-10-CM | POA: Insufficient documentation

## 2023-09-20 DIAGNOSIS — Z90710 Acquired absence of both cervix and uterus: Secondary | ICD-10-CM | POA: Insufficient documentation

## 2023-09-20 DIAGNOSIS — Z7722 Contact with and (suspected) exposure to environmental tobacco smoke (acute) (chronic): Secondary | ICD-10-CM | POA: Insufficient documentation

## 2023-09-20 DIAGNOSIS — Z951 Presence of aortocoronary bypass graft: Secondary | ICD-10-CM | POA: Diagnosis not present

## 2023-09-20 DIAGNOSIS — I5030 Unspecified diastolic (congestive) heart failure: Secondary | ICD-10-CM | POA: Diagnosis not present

## 2023-09-20 DIAGNOSIS — I081 Rheumatic disorders of both mitral and tricuspid valves: Secondary | ICD-10-CM | POA: Insufficient documentation

## 2023-09-20 DIAGNOSIS — I4819 Other persistent atrial fibrillation: Secondary | ICD-10-CM | POA: Insufficient documentation

## 2023-09-20 DIAGNOSIS — I2510 Atherosclerotic heart disease of native coronary artery without angina pectoris: Secondary | ICD-10-CM | POA: Insufficient documentation

## 2023-09-20 DIAGNOSIS — E785 Hyperlipidemia, unspecified: Secondary | ICD-10-CM | POA: Insufficient documentation

## 2023-09-20 DIAGNOSIS — Z7901 Long term (current) use of anticoagulants: Secondary | ICD-10-CM | POA: Diagnosis not present

## 2023-09-20 MED ORDER — FUROSEMIDE 20 MG PO TABS: ORAL_TABLET | ORAL | 3 refills | Status: DC

## 2023-09-20 NOTE — Patient Instructions (Addendum)
Great to see you today!!!  Continue take Furosemide (Lasix) 40 mg (2 tabs) Twice daily FOR 2 DAYS, then decrease to 20 mg (1 tab) Twice daily   Keep your follow-up appointment as scheduled 10/12/23  Do the following things EVERYDAY: Weigh yourself in the morning before breakfast. Write it down and keep it in a log. Take your medicines as prescribed Eat low salt foods--Limit salt (sodium) to 2000 mg per day.  Stay as active as you can everyday Limit all fluids for the day to less than 2 liters  If you have any questions or concerns before your next appointment please send [place] a message through [place] or call our office at [phone].    TO LEAVE A MESSAGE FOR THE NURSE SELECT OPTION 2, PLEASE LEAVE A MESSAGE INCLUDING: YOUR NAME DATE OF BIRTH CALL BACK NUMBER REASON FOR CALL**this is important as we prioritize the call backs  YOU WILL RECEIVE A CALL BACK THE SAME DAY AS LONG AS YOU CALL BEFORE 4:00 PM

## 2023-09-20 NOTE — Progress Notes (Signed)
ReDS Vest / Clip - 09/20/23 1200       ReDS Vest / Clip   Station Marker A    Ruler Value 25    ReDS Value Range Moderate volume overload    ReDS Actual Value 36

## 2023-09-20 NOTE — Progress Notes (Signed)
ADVANCED HEART FAILURE CLINIC NOTE  Referring Physician: Charlane Ferretti, DO  Primary Care: Charlane Ferretti, DO Primary Cardiologist: [name]  HPI: [name] is a 89 y.o. female with history of CABG in 2007 for left main disease by Dr. Dorris Fetch, persistent atrial fibrillation, hypertension, hyperlipidemia and history of hysterectomy in 2012 and recently diagnosed pulmonary hypertension and severe tricuspid regurgitation presenting today to establish care.  Despite her age, Ms. [name] has remained very active.  She continues to go bowling and square dancing however now reports becoming increasingly limited due to exertional dyspnea.  Due to shortness of breath, patient had an echocardiogram that demonstrated severe tricuspid regurgitation with a severely dilated right atrium and severely elevated PA systolic pressures.  She had a right heart cath which further confirmed a PVR of 3-4 Wood units.    She presents today for a 1 week follow up. Last week she was hypervolemic with worsening cough, PND and dyspnea. We increased lasix to 40mg  BID. With that change she has had  6lb weight loss with improvement in dyspnea and resolution of cough. She is still mildly hypervolemic on exam.   Activity level/exercise tolerance: NYHA III Orthopnea:  Sleeps on 2 pillows Paroxysmal noctural dyspnea: No Chest pain/pressure: No Orthostatic lightheadedness: No Palpitations: No Lower extremity edema: Minimal, 1+ on the left.  Presyncope/syncope: No Cough: No  Past Medical History:  Diagnosis Date   Chronic atrial fibrillation (HCC)    a. Pt declined cardioversion.   Coronary artery disease    a. s/p CABGx2 06/2006 (LIMA-LAD, SVG-LCx at bifurcation of OM II/III). b. Nuc 11/2012: no evidence of ischemia, attenuation artifact c/w breast attenuation in anterior region, essentially low risk, EF 88%.   Essential hypertension    Hyperlipidemia    Hypothyroidism    Insomnia    on ambien, failed other  meds   Iron deficiency anemia    Osteoarthritis    Osteopenia    involved on BMD 06/01/07 (T score-1.6); slightly worse in 3/13 (T score -1.6; FRAX 3%  and 12%)-plan rechcekin 2016   Uterine prolapse    post hysterectomy, Dr. Vincente Poli   UTI (lower urinary tract infection) 3/11    Current Outpatient Medications  Medication Sig Dispense Refill   acetaminophen (TYLENOL) 500 MG tablet Take 500 mg by mouth daily. Am and  early afternoon 1 tablet     alendronate (FOSAMAX) 70 MG tablet Take 70 mg by mouth once a week.      apixaban (ELIQUIS) 2.5 MG TABS tablet Take 1 tablet (2.5 mg total) by mouth 2 (two) times daily. 180 tablet 3   atorvastatin (LIPITOR) 20 MG tablet TAKE 1 TABLET BY MOUTH EVERY DAY 90 tablet 2   Bioflavonoid Products (ESTER-C) TABS Take 1 tablet by mouth daily.     BIOTIN PO Take 10,000 mg by mouth daily.     bisoprolol (ZEBETA) 5 MG tablet Take 2.5 mg by mouth daily.     Calcium Carbonate Antacid (CALCIUM CARBONATE PO) Take 1 tablet by mouth 2 (two) times daily.     digoxin (LANOXIN) 0.125 MG tablet Take 0.5 tablets (0.0625 mg total) by mouth daily. 45 tablet 3   famotidine (PEPCID) 20 MG tablet Take 20 mg by mouth at bedtime.     ferrous sulfate 325 (65 FE) MG tablet Take 1 tablet (325 mg total) by mouth daily.     furosemide (LASIX) 20 MG tablet Take 2 tablets (40 mg total) by mouth 2 (two) times daily for 5  days, THEN 1 tablet (20 mg total) as needed for fluid or edema. 120 tablet 3   Hypromellose (ARTIFICIAL TEARS OP) Place 1 drop into both eyes every 6 (six) hours as needed (dry eyes).     levothyroxine (SYNTHROID, LEVOTHROID) 75 MCG tablet Take 37.5-75 mcg by mouth See admin instructions. Take 75 mg  6 days a week and 37.5 mg every Friday in the morning before breakfast     Misc Natural Products (OSTEO BI-FLEX ADV JOINT SHIELD PO) Take 1 tablet by mouth 2 (two) times daily.      nitroGLYCERIN (NITROSTAT) 0.4 MG SL tablet Place 1 tablet (0.4 mg total) under the tongue every  5 (five) minutes as needed for chest pain. 25 tablet 0   Omega-3 Fatty Acids (FISH OIL) 1200 MG CAPS Take 1,200 mg by mouth 2 (two) times daily.     senna (SENNA LAX) 8.6 MG tablet Take 1 tablet (8.6 mg total) by mouth daily.     sildenafil (REVATIO) 20 MG tablet Take 10 mg by mouth in the morning and at bedtime.     Tiotropium Bromide-Olodaterol (STIOLTO RESPIMAT) 2.5-2.5 MCG/ACT AERS Inhale 2.5 mcg into the lungs daily at 6 (six) AM. INHALE 2 PUFFS BY MOUTH INTO THE LUNGS DAILY 12 g 3   vitamin B-12 (CYANOCOBALAMIN) 1000 MCG tablet Take 1,000 mcg by mouth daily.     zolpidem (AMBIEN) 5 MG tablet Take 5 mg by mouth at bedtime.     No current facility-administered medications for this visit.    Allergies  Allergen Reactions   Sulfa Antibiotics Rash      Social History   Socioeconomic History   Marital status: Widowed    Spouse name: Not on file   Number of children: Not on file   Years of education: Not on file   Highest education level: Not on file  Occupational History   Occupation: retired  Tobacco Use   Smoking status: Never    Passive exposure: Past   Smokeless tobacco: Never  Vaping Use   Vaping status: Never Used  Substance and Sexual Activity   Alcohol use: No    Alcohol/week: 0.0 standard drinks of alcohol   Drug use: No   Sexual activity: Not Currently  Other Topics Concern   Not on file  Social History Narrative   Not on file   Social Determinants of Health   Financial Resource Strain: Not on file  Food Insecurity: Not on file  Transportation Needs: Not on file  Physical Activity: Not on file  Stress: Not on file  Social Connections: Not on file  Intimate Partner Violence: Not on file      Family History  Problem Relation Age of Onset   Other Mother    Heart disease Mother    Syncope episode Mother    Ulcers Mother    Heart disease Father    Heart attack Father    CAD Father    Diabetes Father    Lymphoma Sister    Cancer Brother         throat   Brain cancer Brother    Throat cancer Brother    Diabetes Brother    Diabetes type II Son    Colon cancer Neg Hx    Esophageal cancer Neg Hx    Stomach cancer Neg Hx    Pancreatic cancer Neg Hx     PHYSICAL EXAM: Vitals:   09/20/23 1150  BP: 138/72  Pulse: 60  SpO2: 100%  GENERAL: Well nourished, well developed, and in no apparent distress at rest.  HEENT: Negative for arcus senilis or xanthelasma. There is no scleral icterus.  The mucous membranes are pink and moist.   NECK: Supple, No masses. Normal carotid upstrokes without bruits. No masses or thyromegaly.    CHEST: There are no chest wall deformities. There is no chest wall tenderness. Respirations are unlabored.  Lungs- CTA B/L  CARDIAC:  JVP: 10 cm          Normal rate with regular rhythm. No murmurs, rubs or gallops.  Pulses are 2+ and symmetrical in upper and lower extremities. 1+ LLE edema.  ABDOMEN: Soft, non-tender, non-distended. There are no masses or hepatomegaly. There are normal bowel sounds.  EXTREMITIES: Warm and well perfused with no cyanosis, clubbing.  LYMPHATIC: No axillary or supraclavicular lymphadenopathy.  NEUROLOGIC: Patient is oriented x3 with no focal or lateralizing neurologic deficits.  PSYCH: Patients affect is appropriate, there is no evidence of anxiety or depression.  SKIN: Warm and dry; no lesions or wounds.    DATA REVIEW  ECG: 02/25/23: Atrial fibrillation  As per my personal interpretation  ECHO: 02/08/23 LVEF 55%, mildly reduced RV, severely dilated RA. severe TR, moderate MR,  As per my personal interpretation  CATH: 02/17/23:  Hemodynamic findings consistent with mild to moderate pulmonary hypertension.   Aortic saturation 98% noninvasively.  PA saturation 65%.  RA pressure 20/30, mean RA pressure 18 mmHg, RV pressure 58/3, PA pressure 57/19, mean PA pressure 34 mmHg, pulmonary capillary wedge pressure 18/29, mean pulmonary capillary wedge pressure 19.  Cardiac output 4.3  L/min, cardiac index 2.85, calculated PVR 3.48.   prominent V waves noted on the wedge tracing.   RA waveform suggestive of significant tricuspid regurgitation.   02/25/23: Pt ambulated 228.6 meters O2 Sat ranged 99 on 96 on room air HR ranged 77-86    ASSESSMENT & PLAN:  Pulmonary Hypertension, severe tricuspid regurgitation, RV dysfunction -Echocardiogram demonstrates severely dilated right atrium with severe torrential tricuspid regurgitation due to poor tricuspid leaflet coaptation.  She has had a right heart cath with a PVR of 3.5 Wood units, RA pressure of 20-30 with large V waves and a PA mean of 34 mmHg. -Repeat REDS today.  -She does have PFTs from 2017 that demonstrate obstructive lung disease that is at least moderate from secondhand smoking exposure.  6-minute walk test today at last appointment.  I had a lengthy discussion with her today regarding various medication combinations that we can try to improve her functional status.  - Continue sildenafil 10mg  TID; despite having underlying mitral regurgitation she has had improvement in dyspnea with the addition of sildenafil.  I believe this is likely due to reduction in TR /RV afterload.  I have asked her to keep a blood pressure log for the next 1 week.  Will consider adding on low-dose losartan for afterload reduction for her mitral regurgitation if she can tolerate it. - CT chest (03/24/23) w/o significant parenchymal lung disease.  - Continue digoxin 0.0611mcg - Continue bisoprolol to 2.5mg , limited by low heart rates. - Remains mildly hypervolemic on exam; will continue lasix 40mg  BID for the next 2 days and then switch to 20mg  BID.  - REDS 36% today. See above.   2.  Persistent atrial fibrillation -Continue bisoprolol -apixaban 2.5mg  BID -rate controlled today; asymptomatic.   3. CAD s/p CABG - followed by [name] -No CP.    Lanice Folden Advanced Heart Failure Mechanical Circulatory Support

## 2023-10-11 ENCOUNTER — Other Ambulatory Visit (HOSPITAL_COMMUNITY): Payer: Self-pay | Admitting: Cardiology

## 2023-10-11 NOTE — Progress Notes (Signed)
ADVANCED HEART FAILURE CLINIC NOTE  Referring Physician: Charlane Ferretti, DO  Primary Care: Charlane Ferretti, DO Primary Cardiologist: [name]  HPI: [name] is a 89 y.o. female with history of CABG in 2007 for left main disease by Dr. Dorris Fetch, persistent atrial fibrillation, hypertension, hyperlipidemia and history of hysterectomy in 2012 and recently diagnosed pulmonary hypertension and severe tricuspid regurgitation presenting today to establish care.  Despite her age, [name] has remained very active.  She continues to go bowling and square dancing however now reports becoming increasingly limited due to exertional dyspnea.  Due to shortness of breath, patient had an echocardiogram that demonstrated severe tricuspid regurgitation with a severely dilated right atrium and severely elevated PA systolic pressures.  She had a right heart cath which further confirmed a PVR of 3-4 Wood units.    She presents today for a 1 week follow up. Last week she was hypervolemic with worsening cough, PND and dyspnea. We increased lasix to 40mg  BID. With that change she has had  6lb weight loss with improvement in dyspnea and resolution of cough. She is still mildly hypervolemic on exam.   Activity level/exercise tolerance: NYHA III Orthopnea:  Sleeps on 2 pillows Paroxysmal noctural dyspnea: No Chest pain/pressure: No Orthostatic lightheadedness: No Palpitations: No Lower extremity edema: Minimal, 1+ on the left.  Presyncope/syncope: No Cough: No  Past Medical History:  Diagnosis Date   Chronic atrial fibrillation (HCC)    a. Pt declined cardioversion.   Coronary artery disease    a. s/p CABGx2 06/2006 (LIMA-LAD, SVG-LCx at bifurcation of OM II/III). b. Nuc 11/2012: no evidence of ischemia, attenuation artifact c/w breast attenuation in anterior region, essentially low risk, EF 88%.   Essential hypertension    Hyperlipidemia    Hypothyroidism    Insomnia    on ambien, failed other  meds   Iron deficiency anemia    Osteoarthritis    Osteopenia    involved on BMD 06/01/07 (T score-1.6); slightly worse in 3/13 (T score -1.6; FRAX 3%  and 12%)-plan rechcekin 2016   Uterine prolapse    post hysterectomy, Dr. Vincente Poli   UTI (lower urinary tract infection) 3/11    Current Outpatient Medications  Medication Sig Dispense Refill   acetaminophen (TYLENOL) 500 MG tablet Take 500 mg by mouth daily. Am and  early afternoon 1 tablet     alendronate (FOSAMAX) 70 MG tablet Take 70 mg by mouth once a week.      apixaban (ELIQUIS) 2.5 MG TABS tablet Take 1 tablet (2.5 mg total) by mouth 2 (two) times daily. 180 tablet 3   atorvastatin (LIPITOR) 20 MG tablet TAKE 1 TABLET BY MOUTH EVERY DAY 90 tablet 2   Bioflavonoid Products (ESTER-C) TABS Take 1 tablet by mouth daily.     BIOTIN PO Take 10,000 mg by mouth daily.     bisoprolol (ZEBETA) 5 MG tablet Take 2.5 mg by mouth daily.     Calcium Carbonate Antacid (CALCIUM CARBONATE PO) Take 1 tablet by mouth 2 (two) times daily.     digoxin (LANOXIN) 0.125 MG tablet Take 0.5 tablets (0.0625 mg total) by mouth daily. 45 tablet 3   famotidine (PEPCID) 20 MG tablet Take 20 mg by mouth at bedtime.     ferrous sulfate 325 (65 FE) MG tablet Take 1 tablet (325 mg total) by mouth daily.     furosemide (LASIX) 20 MG tablet Take 2 tablets (40 mg total) by mouth 2 (two) times daily for 2  days, THEN 1 tablet (20 mg total) 2 (two) times daily. 120 tablet 3   Hypromellose (ARTIFICIAL TEARS OP) Place 1 drop into both eyes every 6 (six) hours as needed (dry eyes).     levothyroxine (SYNTHROID, LEVOTHROID) 75 MCG tablet Take 37.5-75 mcg by mouth See admin instructions. Take 75 mg  6 days a week and 37.5 mg every Friday in the morning before breakfast     Misc Natural Products (OSTEO BI-FLEX ADV JOINT SHIELD PO) Take 1 tablet by mouth 2 (two) times daily.      nitroGLYCERIN (NITROSTAT) 0.4 MG SL tablet Place 1 tablet (0.4 mg total) under the tongue every 5 (five)  minutes as needed for chest pain. 25 tablet 0   Omega-3 Fatty Acids (FISH OIL) 1200 MG CAPS Take 1,200 mg by mouth 2 (two) times daily.     senna (SENNA LAX) 8.6 MG tablet Take 1 tablet (8.6 mg total) by mouth daily.     sildenafil (REVATIO) 20 MG tablet Take 10 mg by mouth in the morning and at bedtime.     Tiotropium Bromide-Olodaterol (STIOLTO RESPIMAT) 2.5-2.5 MCG/ACT AERS Inhale 2.5 mcg into the lungs daily at 6 (six) AM. INHALE 2 PUFFS BY MOUTH INTO THE LUNGS DAILY 12 g 3   vitamin B-12 (CYANOCOBALAMIN) 1000 MCG tablet Take 1,000 mcg by mouth daily.     zolpidem (AMBIEN) 5 MG tablet Take 5 mg by mouth at bedtime.     No current facility-administered medications for this visit.    Allergies  Allergen Reactions   Sulfa Antibiotics Rash      Social History   Socioeconomic History   Marital status: Widowed    Spouse name: Not on file   Number of children: Not on file   Years of education: Not on file   Highest education level: Not on file  Occupational History   Occupation: retired  Tobacco Use   Smoking status: Never    Passive exposure: Past   Smokeless tobacco: Never  Vaping Use   Vaping status: Never Used  Substance and Sexual Activity   Alcohol use: No    Alcohol/week: 0.0 standard drinks of alcohol   Drug use: No   Sexual activity: Not Currently  Other Topics Concern   Not on file  Social History Narrative   Not on file   Social Determinants of Health   Financial Resource Strain: Not on file  Food Insecurity: Not on file  Transportation Needs: Not on file  Physical Activity: Not on file  Stress: Not on file  Social Connections: Not on file  Intimate Partner Violence: Not on file      Family History  Problem Relation Age of Onset   Other Mother    Heart disease Mother    Syncope episode Mother    Ulcers Mother    Heart disease Father    Heart attack Father    CAD Father    Diabetes Father    Lymphoma Sister    Cancer Brother        throat    Brain cancer Brother    Throat cancer Brother    Diabetes Brother    Diabetes type II Son    Colon cancer Neg Hx    Esophageal cancer Neg Hx    Stomach cancer Neg Hx    Pancreatic cancer Neg Hx     PHYSICAL EXAM: There were no vitals filed for this visit. GENERAL: Well nourished, well developed, and in no apparent  distress at rest.  HEENT: Negative for arcus senilis or xanthelasma. There is no scleral icterus.  The mucous membranes are pink and moist.   NECK: Supple, No masses. Normal carotid upstrokes without bruits. No masses or thyromegaly.    CHEST: There are no chest wall deformities. There is no chest wall tenderness. Respirations are unlabored.  Lungs- *** CARDIAC:  JVP: *** cm          Normal rate with regular rhythm. No murmurs, rubs or gallops.  Pulses are 2+ and symmetrical in upper and lower extremities. *** edema.  ABDOMEN: Soft, non-tender, non-distended. There are no masses or hepatomegaly. There are normal bowel sounds.  EXTREMITIES: Warm and well perfused with no cyanosis, clubbing.  LYMPHATIC: No axillary or supraclavicular lymphadenopathy.  NEUROLOGIC: Patient is oriented x3 with no focal or lateralizing neurologic deficits.  PSYCH: Patients affect is appropriate, there is no evidence of anxiety or depression.  SKIN: Warm and dry; no lesions or wounds.     DATA REVIEW  ECG: 02/25/23: Atrial fibrillation  As per my personal interpretation  ECHO: 02/08/23 LVEF 55%, mildly reduced RV, severely dilated RA. severe TR, moderate MR,  As per my personal interpretation  CATH: 02/17/23:  Hemodynamic findings consistent with mild to moderate pulmonary hypertension.   Aortic saturation 98% noninvasively.  PA saturation 65%.  RA pressure 20/30, mean RA pressure 18 mmHg, RV pressure 58/3, PA pressure 57/19, mean PA pressure 34 mmHg, pulmonary capillary wedge pressure 18/29, mean pulmonary capillary wedge pressure 19.  Cardiac output 4.3 L/min, cardiac index 2.85, calculated  PVR 3.48.   prominent V waves noted on the wedge tracing.   RA waveform suggestive of significant tricuspid regurgitation.   02/25/23: Pt ambulated 228.6 meters O2 Sat ranged 99 on 96 on room air HR ranged 77-86    ASSESSMENT & PLAN:  Pulmonary Hypertension, severe tricuspid regurgitation, RV dysfunction -Echocardiogram demonstrates severely dilated right atrium with severe torrential tricuspid regurgitation due to poor tricuspid leaflet coaptation.  She has had a right heart cath with a PVR of 3.5 Wood units, RA pressure of 20-30 with large V waves and a PA mean of 34 mmHg. -Repeat REDS today.  -She does have PFTs from 2017 that demonstrate obstructive lung disease that is at least moderate from secondhand smoking exposure.  6-minute walk test today at last appointment.  I had a lengthy discussion with her today regarding various medication combinations that we can try to improve her functional status.  - Continue sildenafil 10mg  TID; despite having underlying mitral regurgitation she has had improvement in dyspnea with the addition of sildenafil.  I believe this is likely due to reduction in TR /RV afterload.  I have asked her to keep a blood pressure log for the next 1 week.  Will consider adding on low-dose losartan for afterload reduction for her mitral regurgitation if she can tolerate it. - CT chest (03/24/23) w/o significant parenchymal lung disease.  - Continue digoxin 0.0680mcg - Continue bisoprolol to 2.5mg , limited by low heart rates. - Remains mildly hypervolemic on exam; will continue lasix 40mg  BID for the next 2 days and then switch to 20mg  BID.  - REDS 36% today. See above.   2.  Persistent atrial fibrillation -Continue bisoprolol -apixaban 2.5mg  BID -rate controlled today; asymptomatic.   3. CAD s/p CABG - followed by [name] -No CP.    [name] Advanced Heart Failure Mechanical Circulatory Support

## 2023-10-12 ENCOUNTER — Encounter (HOSPITAL_COMMUNITY): Payer: Self-pay | Admitting: Cardiology

## 2023-10-12 ENCOUNTER — Ambulatory Visit (HOSPITAL_COMMUNITY)
Admission: RE | Admit: 2023-10-12 | Discharge: 2023-10-12 | Disposition: A | Discharge status: Home / Self Care | Payer: Medicare PPO | Source: Ambulatory Visit | Attending: Cardiology | Admitting: Cardiology

## 2023-10-12 VITALS — BP 120/60 | HR 61 | Wt 110.4 lb

## 2023-10-12 DIAGNOSIS — I110 Hypertensive heart disease with heart failure: Secondary | ICD-10-CM | POA: Diagnosis not present

## 2023-10-12 DIAGNOSIS — R0602 Shortness of breath: Secondary | ICD-10-CM | POA: Diagnosis not present

## 2023-10-12 DIAGNOSIS — R079 Chest pain, unspecified: Secondary | ICD-10-CM | POA: Insufficient documentation

## 2023-10-12 DIAGNOSIS — R9431 Abnormal electrocardiogram [ECG] [EKG]: Secondary | ICD-10-CM | POA: Insufficient documentation

## 2023-10-12 DIAGNOSIS — I2510 Atherosclerotic heart disease of native coronary artery without angina pectoris: Secondary | ICD-10-CM | POA: Diagnosis not present

## 2023-10-12 DIAGNOSIS — Z951 Presence of aortocoronary bypass graft: Secondary | ICD-10-CM | POA: Diagnosis not present

## 2023-10-12 DIAGNOSIS — I10 Essential (primary) hypertension: Secondary | ICD-10-CM | POA: Diagnosis present

## 2023-10-12 DIAGNOSIS — I4821 Permanent atrial fibrillation: Secondary | ICD-10-CM | POA: Diagnosis not present

## 2023-10-12 DIAGNOSIS — J449 Chronic obstructive pulmonary disease, unspecified: Secondary | ICD-10-CM | POA: Diagnosis not present

## 2023-10-12 DIAGNOSIS — Z7901 Long term (current) use of anticoagulants: Secondary | ICD-10-CM | POA: Diagnosis not present

## 2023-10-12 DIAGNOSIS — I519 Heart disease, unspecified: Secondary | ICD-10-CM

## 2023-10-12 DIAGNOSIS — I272 Pulmonary hypertension, unspecified: Secondary | ICD-10-CM | POA: Insufficient documentation

## 2023-10-12 DIAGNOSIS — E785 Hyperlipidemia, unspecified: Secondary | ICD-10-CM | POA: Insufficient documentation

## 2023-10-12 DIAGNOSIS — I4819 Other persistent atrial fibrillation: Secondary | ICD-10-CM | POA: Diagnosis not present

## 2023-10-12 DIAGNOSIS — I071 Rheumatic tricuspid insufficiency: Secondary | ICD-10-CM

## 2023-10-12 DIAGNOSIS — I081 Rheumatic disorders of both mitral and tricuspid valves: Secondary | ICD-10-CM | POA: Insufficient documentation

## 2023-10-12 DIAGNOSIS — I5040 Unspecified combined systolic (congestive) and diastolic (congestive) heart failure: Secondary | ICD-10-CM | POA: Insufficient documentation

## 2023-10-12 DIAGNOSIS — Z79899 Other long term (current) drug therapy: Secondary | ICD-10-CM | POA: Insufficient documentation

## 2023-10-12 LAB — ECHOCARDIOGRAM COMPLETE
Area-P 1/2: 4.21 cm2
Calc EF: 58.7 %
MV M vel: 4.07 m/s
MV Peak grad: 66.3 mm[Hg]
Radius: 0.2 cm
S' Lateral: 2.65 cm
Single Plane A2C EF: 57.1 %
Single Plane A4C EF: 62.3 %

## 2023-10-12 LAB — BASIC METABOLIC PANEL
Anion gap: 9 (ref 5–15)
BUN: 20 mg/dL (ref 8–23)
CO2: 23 mmol/L (ref 22–32)
Calcium: 9.8 mg/dL (ref 8.9–10.3)
Chloride: 108 mmol/L (ref 98–111)
Creatinine, Ser: 1.06 mg/dL — ABNORMAL HIGH (ref 0.44–1.00)
GFR, Estimated: 50 mL/min — ABNORMAL LOW (ref >60)
Glucose, Bld: 99 mg/dL (ref 70–99)
Potassium: 3.5 mmol/L (ref 3.5–5.1)
Sodium: 140 mmol/L (ref 135–145)

## 2023-10-12 LAB — BRAIN NATRIURETIC PEPTIDE: B Natriuretic Peptide: 268.8 pg/mL — ABNORMAL HIGH (ref 0.0–100.0)

## 2023-10-12 MED ORDER — FUROSEMIDE 20 MG PO TABS: 20.0000 mg | ORAL_TABLET | Freq: Two times a day (BID) | ORAL | 3 refills | Status: DC

## 2023-10-12 MED ORDER — DIGOXIN 125 MCG PO TABS: 0.0625 mg | ORAL_TABLET | Freq: Every day | ORAL | 3 refills | Status: DC

## 2023-10-12 MED ORDER — NITROGLYCERIN 0.4 MG SL SUBL: 0.4000 mg | SUBLINGUAL_TABLET | SUBLINGUAL | 0 refills | Status: AC | PRN

## 2023-10-12 NOTE — Progress Notes (Signed)
  Echocardiogram 2D Echocardiogram has been performed.  [name] 10/12/2023, 12:07 PM

## 2023-10-12 NOTE — Patient Instructions (Addendum)
Friday- lasix 60 mg in the morning and 40 mg in the evening.  Sat- Lasix 60 mg in the morning and 40 mg in the evening.  Sun- Lasix 60 mg in the morning and 40 mg in the evening  Then go to 20 mg Twice daily adjust according to your fluid status.  Labs done today, your results will be available in MyChart, we will contact you for abnormal readings.  Your physician recommends that you schedule a follow-up appointment in: 2 months at the [place] office. Please call the [place] office on [phone] to arrange your follow up appointment.  If you have any questions or concerns before your next appointment please send [place] a message through [place] or call our office at [phone].    TO LEAVE A MESSAGE FOR THE NURSE SELECT OPTION 2, PLEASE LEAVE A MESSAGE INCLUDING: YOUR NAME DATE OF BIRTH CALL BACK NUMBER REASON FOR CALL**this is important as we prioritize the call backs  YOU WILL RECEIVE A CALL BACK THE SAME DAY AS LONG AS YOU CALL BEFORE 4:00 PM  At the [facility], you and your health needs are our priority. As part of our continuing mission to provide you with exceptional heart care, we have created designated Provider Care Teams. These Care Teams include your primary Cardiologist (physician) and Advanced Practice Providers (APPs- Physician Assistants and Nurse Practitioners) who all work together to provide you with the care you need, when you need it.   You may see any of the following providers on your designated Care Team at your next follow up: Dr Arvilla Meres Dr Marca Ancona Dr. Dorthula Nettles Dr. Clearnce Hasten Amy Filbert Schilder, NP Robbie Lis, [place] [facility] [place], [place] Brynda Peon, NP [place] Lee, NP Karle Plumber, PharmD   Please be sure to bring in all your medications bottles to every appointment.    Thank you for choosing [facility]

## 2023-10-28 DIAGNOSIS — H04123 Dry eye syndrome of bilateral lacrimal glands: Secondary | ICD-10-CM | POA: Diagnosis not present

## 2023-10-28 DIAGNOSIS — H10413 Chronic giant papillary conjunctivitis, bilateral: Secondary | ICD-10-CM | POA: Diagnosis not present

## 2023-11-06 ENCOUNTER — Telehealth: Payer: Self-pay | Admitting: Student

## 2023-11-06 NOTE — Telephone Encounter (Signed)
    The patient called the after-hours line reporting that she is having a dry cough (for the past 2 days) and is experiencing worsening shortness of breath with activity and when lying down at night to go to sleep. She reports being at a family gathering and did take a COVID test today which was negative. Says that her weight has been around 112 - 113 lbs which is her baseline.  She has been taking Lasix  20 mg twice daily as she only took the higher dose of Lasix  as prescribed in 10/2023 for 2 days due to frequent urination. Since her symptoms sound most consistent with volume overload and orthopnea, I did recommend that she increase Lasix  from 20 mg twice daily to 40 mg twice daily for today and tomorrow. Encouraged her to reach out if symptoms have not improved as she would need closer office evaluation and follow-up labs including a BNP and BMET (would also check a CBC as she reports a small amount of blood on the toilet paper when she wipes but no significant bleeding - by description,  likely due to hemorrhoids but would check to make sure anemia is not contributing to her symptoms).  She voiced understanding of this and was appreciative of the return call. Will forward to Dr. Gardenia as an FYI since she does have scheduled follow-up with him on 11/15/2023.  Signed, Laymon CHRISTELLA Qua, PA-C 11/06/2023, 11:53 AM Pager: [phone]

## 2023-11-09 DIAGNOSIS — H04123 Dry eye syndrome of bilateral lacrimal glands: Secondary | ICD-10-CM | POA: Diagnosis not present

## 2023-11-09 DIAGNOSIS — H10413 Chronic giant papillary conjunctivitis, bilateral: Secondary | ICD-10-CM | POA: Diagnosis not present

## 2023-11-15 ENCOUNTER — Ambulatory Visit: Payer: Medicare PPO | Attending: Cardiology | Admitting: Cardiology

## 2023-11-15 ENCOUNTER — Other Ambulatory Visit
Admission: RE | Admit: 2023-11-15 | Discharge: 2023-11-15 | Disposition: A | Discharge status: Home / Self Care | Payer: Medicare PPO | Source: Ambulatory Visit | Attending: Cardiology | Admitting: Cardiology

## 2023-11-15 VITALS — BP 133/77 | HR 63 | Wt 115.0 lb

## 2023-11-15 DIAGNOSIS — I700 Atherosclerosis of aorta: Secondary | ICD-10-CM | POA: Diagnosis not present

## 2023-11-15 DIAGNOSIS — I272 Pulmonary hypertension, unspecified: Secondary | ICD-10-CM

## 2023-11-15 DIAGNOSIS — I4821 Permanent atrial fibrillation: Secondary | ICD-10-CM | POA: Insufficient documentation

## 2023-11-15 DIAGNOSIS — E785 Hyperlipidemia, unspecified: Secondary | ICD-10-CM | POA: Diagnosis not present

## 2023-11-15 DIAGNOSIS — I10 Essential (primary) hypertension: Secondary | ICD-10-CM | POA: Diagnosis not present

## 2023-11-15 DIAGNOSIS — R0609 Other forms of dyspnea: Secondary | ICD-10-CM | POA: Diagnosis not present

## 2023-11-15 DIAGNOSIS — Z951 Presence of aortocoronary bypass graft: Secondary | ICD-10-CM | POA: Insufficient documentation

## 2023-11-15 LAB — BASIC METABOLIC PANEL
Anion gap: 9 (ref 5–15)
BUN: 20 mg/dL (ref 8–23)
CO2: 24 mmol/L (ref 22–32)
Calcium: 9.3 mg/dL (ref 8.9–10.3)
Chloride: 106 mmol/L (ref 98–111)
Creatinine, Ser: 0.9 mg/dL (ref 0.44–1.00)
GFR, Estimated: >60 mL/min (ref >60)
Glucose, Bld: 97 mg/dL (ref 70–99)
Potassium: 4.5 mmol/L (ref 3.5–5.1)
Sodium: 139 mmol/L (ref 135–145)

## 2023-11-15 LAB — CBC
HCT: 33.3 % — ABNORMAL LOW (ref 36.0–46.0)
Hemoglobin: 10.6 g/dL — ABNORMAL LOW (ref 12.0–15.0)
MCH: 29.5 pg (ref 26.0–34.0)
MCHC: 31.8 g/dL (ref 30.0–36.0)
MCV: 92.8 fL (ref 80.0–100.0)
Platelets: 156 10*3/uL (ref 150–400)
RBC: 3.59 MIL/uL — ABNORMAL LOW (ref 3.87–5.11)
RDW: 14.9 % (ref 11.5–15.5)
WBC: 5.7 10*3/uL (ref 4.0–10.5)
nRBC: 0 % (ref 0.0–0.2)

## 2023-11-15 LAB — BRAIN NATRIURETIC PEPTIDE: B Natriuretic Peptide: 354 pg/mL — ABNORMAL HIGH (ref 0.0–100.0)

## 2023-11-15 NOTE — Patient Instructions (Addendum)
 Labs done today. We will contact you only if your labs are abnormal.  INCREASE Lasix  to 60mg  (3 tablets) by mouth 2 times daily for 3 days THEN DECREASE to 20mg  (1 tablet) by mouth 2 times daily.   No other medication changes were made. Please continue all current medications as prescribed.  Your physician recommends that you schedule a follow-up appointment in: 6 weeks with Dr. Sabharwal  If you have any questions or concerns before your next appointment please send us  a message through [facility] or call our office at [phone].    TO LEAVE A MESSAGE FOR THE NURSE SELECT OPTION 2, PLEASE LEAVE A MESSAGE INCLUDING: YOUR NAME DATE OF BIRTH CALL BACK NUMBER REASON FOR CALL**this is important as we prioritize the call backs  YOU WILL RECEIVE A CALL BACK THE SAME DAY AS LONG AS YOU CALL BEFORE 4:00 PM   Do the following things EVERYDAY: Weigh yourself in the morning before breakfast. Write it down and keep it in a log. Take your medicines as prescribed Eat low salt foods--Limit salt (sodium) to 2000 mg per day.  Stay as active as you can everyday Limit all fluids for the day to less than 2 liters   At the [facility], you and your health needs are our priority. As part of our continuing mission to provide you with exceptional heart care, we have created designated Provider Care Teams. These Care Teams include your primary Cardiologist (physician) and Advanced Practice Providers (APPs- Physician Assistants and Nurse Practitioners) who all work together to provide you with the care you need, when you need it.   You may see any of the following providers on your designated Care Team at your next follow up: Dr Toribio Fuel Dr Ezra Shuck Dr. Ria Gardenia Greig Lenetta, NP Caffie Shed, [place] [facility] [place], [place] Beckey Coe, NP Tinnie Redman, PharmD   Please be sure to bring in all your medications bottles to every appointment.    Thank you  for choosing [facility]

## 2023-11-15 NOTE — Progress Notes (Signed)
 ADVANCED HEART FAILURE CLINIC NOTE  Referring Physician: Valentin Skates, DO  Primary Care: Valentin Skates, DO Primary Cardiologist: Dr. Dann  HPI: [name] is a 89 y.o. female with history of CABG in 2007 for left main disease by Dr. Kerrin, persistent atrial fibrillation, hypertension, hyperlipidemia and history of hysterectomy in 2012 and recently diagnosed pulmonary hypertension and severe tricuspid regurgitation presenting today for follow up  Despite her age, [name] has remained very active.  She continues to go bowling and square dancing however now reports becoming increasingly limited due to exertional dyspnea.  Due to shortness of breath, patient had an echocardiogram that demonstrated severe tricuspid regurgitation with a severely dilated right atrium and severely elevated PA systolic pressures.  She had a right heart cath which further confirmed a PVR of 3-4 Wood units.    With additional of low dose GDMT, sildenafil  & digoxin , she has continued to partake in a bowling league, drives, performs ADLs independently and lives a highly functional life.   Today she presents with volume overload. +2 pitting edema to the shins. Increase in dyspnea and mild PND. Taking lasix  20mg  BID. She increased to 40mg  BID x 3 days without significant improvement.    Past Medical History:  Diagnosis Date   Chronic atrial fibrillation (HCC)    a. Pt declined cardioversion.   Coronary artery disease    a. s/p CABGx2 06/2006 (LIMA-LAD, SVG-LCx at bifurcation of OM II/III). b. Nuc 11/2012: no evidence of ischemia, attenuation artifact c/w breast attenuation in anterior region, essentially low risk, EF 88%.   Essential hypertension    Hyperlipidemia    Hypothyroidism    Insomnia    on ambien , failed other meds   Iron deficiency anemia    Osteoarthritis    Osteopenia    involved on BMD 06/01/07 (T score-1.6); slightly worse in 3/13 (T score -1.6; FRAX 3%  and 12%)-plan rechcekin 2016    Uterine prolapse    post hysterectomy, Dr. Mat   UTI (lower urinary tract infection) 3/11    Current Outpatient Medications  Medication Sig Dispense Refill   acetaminophen  (TYLENOL ) 500 MG tablet Take 500 mg by mouth daily. Am and  early afternoon 1 tablet     alendronate (FOSAMAX) 70 MG tablet Take 70 mg by mouth once a week.      apixaban  (ELIQUIS ) 2.5 MG TABS tablet Take 1 tablet (2.5 mg total) by mouth 2 (two) times daily. 180 tablet 3   atorvastatin  (LIPITOR) 20 MG tablet TAKE 1 TABLET BY MOUTH EVERY DAY 90 tablet 2   Bioflavonoid Products (ESTER-C) TABS Take 1 tablet by mouth daily.     BIOTIN  PO Take 10,000 mg by mouth daily.     bisoprolol  (ZEBETA ) 5 MG tablet Take 2.5 mg by mouth daily.     Calcium  Carbonate Antacid (CALCIUM  CARBONATE PO) Take 1 tablet by mouth 2 (two) times daily.     digoxin  (LANOXIN ) 0.125 MG tablet Take 0.5 tablets (0.0625 mg total) by mouth daily. 45 tablet 3   famotidine  (PEPCID ) 20 MG tablet Take 20 mg by mouth at bedtime.     ferrous sulfate  325 (65 FE) MG tablet Take 1 tablet (325 mg total) by mouth daily.     furosemide  (LASIX ) 20 MG tablet Take 20 mg by mouth 2 (two) times daily.     Hypromellose (ARTIFICIAL TEARS OP) Place 1 drop into both eyes every 6 (six) hours as needed (dry eyes).     levothyroxine  (SYNTHROID , LEVOTHROID)  75 MCG tablet Take 37.5-75 mcg by mouth See admin instructions. Take 75 mg  6 days a week and 37.5 mg every Friday in the morning before breakfast     Misc Natural Products (OSTEO BI-FLEX ADV JOINT SHIELD PO) Take 1 tablet by mouth 2 (two) times daily.      nitroGLYCERIN  (NITROSTAT ) 0.4 MG SL tablet Place 1 tablet (0.4 mg total) under the tongue every 5 (five) minutes as needed for chest pain. 25 tablet 0   Omega-3 Fatty Acids (FISH OIL) 1200 MG CAPS Take 1,200 mg by mouth 2 (two) times daily.     senna (SENNA LAX) 8.6 MG tablet Take 1 tablet (8.6 mg total) by mouth daily.     sildenafil  (REVATIO ) 20 MG tablet Take 10 mg by  mouth in the morning and at bedtime.     Tiotropium Bromide -Olodaterol (STIOLTO RESPIMAT ) 2.5-2.5 MCG/ACT AERS Inhale 2.5 mcg into the lungs daily at 6 (six) AM. INHALE 2 PUFFS BY MOUTH INTO THE LUNGS DAILY 12 g 3   vitamin B-12 (CYANOCOBALAMIN ) 1000 MCG tablet Take 1,000 mcg by mouth daily.     zolpidem  (AMBIEN ) 5 MG tablet Take 5 mg by mouth at bedtime.     furosemide  (LASIX ) 20 MG tablet Take 1 tablet (20 mg total) by mouth 2 (two) times daily for 2 days. 120 tablet 3   No current facility-administered medications for this visit.    Allergies  Allergen Reactions   Sulfa Antibiotics Rash      Social History   Socioeconomic History   Marital status: Widowed    Spouse name: Not on file   Number of children: Not on file   Years of education: Not on file   Highest education level: Not on file  Occupational History   Occupation: retired  Tobacco Use   Smoking status: Never    Passive exposure: Past   Smokeless tobacco: Never  Vaping Use   Vaping status: Never Used  Substance and Sexual Activity   Alcohol use: No    Alcohol/week: 0.0 standard drinks of alcohol   Drug use: No   Sexual activity: Not Currently  Other Topics Concern   Not on file  Social History Narrative   Not on file   Social Drivers of Health   Financial Resource Strain: Not on file  Food Insecurity: Not on file  Transportation Needs: Not on file  Physical Activity: Not on file  Stress: Not on file  Social Connections: Not on file  Intimate Partner Violence: Not on file      Family History  Problem Relation Age of Onset   Other Mother    Heart disease Mother    Syncope episode Mother    Ulcers Mother    Heart disease Father    Heart attack Father    CAD Father    Diabetes Father    Lymphoma Sister    Cancer Brother        throat   Brain cancer Brother    Throat cancer Brother    Diabetes Brother    Diabetes type II Son    Colon cancer Neg Hx    Esophageal cancer Neg Hx    Stomach  cancer Neg Hx    Pancreatic cancer Neg Hx     PHYSICAL EXAM: Vitals:   11/15/23 1343  BP: 133/77  Pulse: 63  SpO2: 98%   GENERAL: Well nourished, well developed, and in no apparent distress at rest.  HEENT: There is no  scleral icterus.  The mucous membranes are pink and moist.   CHEST: There are no chest wall deformities. There is no chest wall tenderness. Respirations are unlabored.  Lungs- decreased at bases CARDIAC:  JVP: 14 cm          Normal rate with regular rhythm. systolic murmur.  Pulses are 2+ and symmetrical in upper and lower extremities. +2 edema.  ABDOMEN: Soft, non-tender, non-distended. There are normal bowel sounds.  EXTREMITIES: Warm and well perfused.  NEUROLOGIC: Patient is oriented x3 with no obvious focal neurologic deficits.  PSYCH: Patients affect is appropriate SKIN: Warm and dry; no lesions or wounds.    DATA REVIEW  ECG: 02/25/23: Atrial fibrillation  As per my personal interpretation  ECHO: 02/08/23 LVEF 55%, mildly reduced RV, severely dilated RA. severe TR, moderate MR,  As per my personal interpretation 10/12/23: LVEF 55%, mildly reduced RV function with moderate enlargement; severely dilated RA, severe TR, mild to mod MR, personally reviewed and interpreted.   CATH: 02/17/23:  Hemodynamic findings consistent with mild to moderate pulmonary hypertension.   Aortic saturation 98% noninvasively.  PA saturation 65%.  RA pressure 20/30, mean RA pressure 18 mmHg, RV pressure 58/3, PA pressure 57/19, mean PA pressure 34 mmHg, pulmonary capillary wedge pressure 18/29, mean pulmonary capillary wedge pressure 19.  Cardiac output 4.3 L/min, cardiac index 2.85, calculated PVR 3.48.   prominent V waves noted on the wedge tracing.   RA waveform suggestive of significant tricuspid regurgitation.   02/25/23: Pt ambulated 228.6 meters O2 Sat ranged 99 on 96 on room air HR ranged 77-86    ASSESSMENT & PLAN:  Pulmonary Hypertension, severe tricuspid  regurgitation, RV dysfunction -Echocardiogram today 10/12/23 with preserved LV function; mild ot mod MR, severe TR with a severely dilated RA.  She has had a right heart cath with a PVR of 3.5 Wood units, RA pressure of 20-30 with large V waves and a PA mean of 34 mmHg. -She does have PFTs from 2017 that demonstrate obstructive lung disease that is at least moderate from secondhand smoking exposure.   - Continue sildenafil  10mg  TID; despite having underlying mitral regurgitation she has had improvement in dyspnea with the addition of sildenafil .  I believe this is likely due to reduction in TR /RV afterload.  - Will consider addition of low dose losartan in the future. - CT chest (03/24/23) w/o significant parenchymal lung disease.  - Continue digoxin  0.0618mcg; will repeat levels today.  - Continue bisoprolol  to 2.5mg ,HR 61 today; will continue 2.5mg   - Hypervolemic on exam; increase lasix  to 60mg  BID x 3 days followed by 20mg  BID.  - BMP/BNP today.   2.  Persistent atrial fibrillation -Continue bisoprolol  -apixaban  2.5mg  BID -rate controlled today & asymptomatic   3. CAD s/p CABG - followed by Dr. Dann -No CP.    [name] Advanced Heart Failure Mechanical Circulatory Support

## 2023-11-22 DIAGNOSIS — E039 Hypothyroidism, unspecified: Secondary | ICD-10-CM | POA: Diagnosis not present

## 2023-11-22 DIAGNOSIS — I5032 Chronic diastolic (congestive) heart failure: Secondary | ICD-10-CM | POA: Diagnosis not present

## 2023-11-22 DIAGNOSIS — D509 Iron deficiency anemia, unspecified: Secondary | ICD-10-CM | POA: Diagnosis not present

## 2023-11-22 DIAGNOSIS — E785 Hyperlipidemia, unspecified: Secondary | ICD-10-CM | POA: Diagnosis not present

## 2023-11-22 DIAGNOSIS — I110 Hypertensive heart disease with heart failure: Secondary | ICD-10-CM | POA: Diagnosis not present

## 2023-11-22 DIAGNOSIS — M810 Age-related osteoporosis without current pathological fracture: Secondary | ICD-10-CM | POA: Diagnosis not present

## 2023-11-29 DIAGNOSIS — I482 Chronic atrial fibrillation, unspecified: Secondary | ICD-10-CM | POA: Diagnosis not present

## 2023-11-29 DIAGNOSIS — I071 Rheumatic tricuspid insufficiency: Secondary | ICD-10-CM | POA: Diagnosis not present

## 2023-11-29 DIAGNOSIS — R82998 Other abnormal findings in urine: Secondary | ICD-10-CM | POA: Diagnosis not present

## 2023-11-29 DIAGNOSIS — I25118 Atherosclerotic heart disease of native coronary artery with other forms of angina pectoris: Secondary | ICD-10-CM | POA: Diagnosis not present

## 2023-11-29 DIAGNOSIS — I10 Essential (primary) hypertension: Secondary | ICD-10-CM | POA: Diagnosis not present

## 2023-11-29 DIAGNOSIS — I4891 Unspecified atrial fibrillation: Secondary | ICD-10-CM | POA: Diagnosis not present

## 2023-11-29 DIAGNOSIS — I272 Pulmonary hypertension, unspecified: Secondary | ICD-10-CM | POA: Diagnosis not present

## 2023-11-29 DIAGNOSIS — Z0000 Encounter for general adult medical examination without abnormal findings: Secondary | ICD-10-CM | POA: Diagnosis not present

## 2023-11-29 DIAGNOSIS — J449 Chronic obstructive pulmonary disease, unspecified: Secondary | ICD-10-CM | POA: Diagnosis not present

## 2023-11-29 DIAGNOSIS — I5032 Chronic diastolic (congestive) heart failure: Secondary | ICD-10-CM | POA: Diagnosis not present

## 2023-11-29 DIAGNOSIS — I739 Peripheral vascular disease, unspecified: Secondary | ICD-10-CM | POA: Diagnosis not present

## 2023-12-03 ENCOUNTER — Observation Stay (HOSPITAL_COMMUNITY)
Admission: EM | Admit: 2023-12-03 | Discharge: 2023-12-04 | Disposition: A | Discharge status: Home / Self Care | Payer: Medicare PPO | Attending: Internal Medicine | Admitting: Internal Medicine

## 2023-12-03 ENCOUNTER — Other Ambulatory Visit: Payer: Self-pay

## 2023-12-03 ENCOUNTER — Ambulatory Visit (INDEPENDENT_AMBULATORY_CARE_PROVIDER_SITE_OTHER): Payer: Medicare PPO

## 2023-12-03 ENCOUNTER — Encounter (HOSPITAL_COMMUNITY): Payer: Self-pay | Admitting: Emergency Medicine

## 2023-12-03 ENCOUNTER — Emergency Department (HOSPITAL_COMMUNITY): Payer: Medicare PPO

## 2023-12-03 ENCOUNTER — Encounter (HOSPITAL_COMMUNITY): Payer: Self-pay

## 2023-12-03 ENCOUNTER — Ambulatory Visit (HOSPITAL_COMMUNITY)
Admission: EM | Admit: 2023-12-03 | Discharge: 2023-12-03 | Disposition: A | Discharge status: Home / Self Care | Payer: Medicare PPO | Attending: Neurology | Admitting: Neurology

## 2023-12-03 DIAGNOSIS — J90 Pleural effusion, not elsewhere classified: Secondary | ICD-10-CM | POA: Diagnosis present

## 2023-12-03 DIAGNOSIS — I272 Pulmonary hypertension, unspecified: Secondary | ICD-10-CM | POA: Diagnosis not present

## 2023-12-03 DIAGNOSIS — I10 Essential (primary) hypertension: Secondary | ICD-10-CM | POA: Diagnosis not present

## 2023-12-03 DIAGNOSIS — R0602 Shortness of breath: Secondary | ICD-10-CM

## 2023-12-03 DIAGNOSIS — K8020 Calculus of gallbladder without cholecystitis without obstruction: Secondary | ICD-10-CM | POA: Diagnosis not present

## 2023-12-03 DIAGNOSIS — G4700 Insomnia, unspecified: Secondary | ICD-10-CM | POA: Diagnosis not present

## 2023-12-03 DIAGNOSIS — D649 Anemia, unspecified: Secondary | ICD-10-CM | POA: Diagnosis not present

## 2023-12-03 DIAGNOSIS — Z79899 Other long term (current) drug therapy: Secondary | ICD-10-CM | POA: Insufficient documentation

## 2023-12-03 DIAGNOSIS — E8770 Fluid overload, unspecified: Secondary | ICD-10-CM | POA: Diagnosis not present

## 2023-12-03 DIAGNOSIS — E782 Mixed hyperlipidemia: Secondary | ICD-10-CM | POA: Diagnosis present

## 2023-12-03 DIAGNOSIS — R918 Other nonspecific abnormal finding of lung field: Secondary | ICD-10-CM | POA: Diagnosis not present

## 2023-12-03 DIAGNOSIS — I4581 Long QT syndrome: Secondary | ICD-10-CM | POA: Diagnosis not present

## 2023-12-03 DIAGNOSIS — E785 Hyperlipidemia, unspecified: Secondary | ICD-10-CM | POA: Diagnosis not present

## 2023-12-03 DIAGNOSIS — I4819 Other persistent atrial fibrillation: Secondary | ICD-10-CM | POA: Diagnosis not present

## 2023-12-03 DIAGNOSIS — Z9889 Other specified postprocedural states: Secondary | ICD-10-CM

## 2023-12-03 DIAGNOSIS — E039 Hypothyroidism, unspecified: Secondary | ICD-10-CM | POA: Diagnosis present

## 2023-12-03 DIAGNOSIS — I50813 Acute on chronic right heart failure: Principal | ICD-10-CM

## 2023-12-03 DIAGNOSIS — I509 Heart failure, unspecified: Secondary | ICD-10-CM | POA: Diagnosis not present

## 2023-12-03 DIAGNOSIS — I340 Nonrheumatic mitral (valve) insufficiency: Secondary | ICD-10-CM | POA: Insufficient documentation

## 2023-12-03 DIAGNOSIS — K219 Gastro-esophageal reflux disease without esophagitis: Secondary | ICD-10-CM | POA: Diagnosis not present

## 2023-12-03 DIAGNOSIS — I2699 Other pulmonary embolism without acute cor pulmonale: Secondary | ICD-10-CM | POA: Diagnosis not present

## 2023-12-03 DIAGNOSIS — D696 Thrombocytopenia, unspecified: Secondary | ICD-10-CM | POA: Diagnosis not present

## 2023-12-03 DIAGNOSIS — I2510 Atherosclerotic heart disease of native coronary artery without angina pectoris: Secondary | ICD-10-CM | POA: Insufficient documentation

## 2023-12-03 DIAGNOSIS — J449 Chronic obstructive pulmonary disease, unspecified: Secondary | ICD-10-CM | POA: Diagnosis not present

## 2023-12-03 DIAGNOSIS — I071 Rheumatic tricuspid insufficiency: Secondary | ICD-10-CM

## 2023-12-03 DIAGNOSIS — I110 Hypertensive heart disease with heart failure: Secondary | ICD-10-CM | POA: Diagnosis not present

## 2023-12-03 DIAGNOSIS — Z951 Presence of aortocoronary bypass graft: Secondary | ICD-10-CM | POA: Diagnosis not present

## 2023-12-03 DIAGNOSIS — I482 Chronic atrial fibrillation, unspecified: Secondary | ICD-10-CM | POA: Diagnosis present

## 2023-12-03 DIAGNOSIS — J9811 Atelectasis: Secondary | ICD-10-CM | POA: Diagnosis not present

## 2023-12-03 LAB — CBC WITH DIFFERENTIAL/PLATELET
Abs Immature Granulocytes: 0.03 10*3/uL (ref 0.00–0.07)
Basophils Absolute: 0 10*3/uL (ref 0.0–0.1)
Basophils Relative: 0 %
Eosinophils Absolute: 0 10*3/uL (ref 0.0–0.5)
Eosinophils Relative: 0 %
HCT: 33.2 % — ABNORMAL LOW (ref 36.0–46.0)
Hemoglobin: 10.6 g/dL — ABNORMAL LOW (ref 12.0–15.0)
Immature Granulocytes: 0 %
Lymphocytes Relative: 11 %
Lymphs Abs: 0.8 10*3/uL (ref 0.7–4.0)
MCH: 29.4 pg (ref 26.0–34.0)
MCHC: 31.9 g/dL (ref 30.0–36.0)
MCV: 92.2 fL (ref 80.0–100.0)
Monocytes Absolute: 1.2 10*3/uL — ABNORMAL HIGH (ref 0.1–1.0)
Monocytes Relative: 17 %
Neutro Abs: 4.8 10*3/uL (ref 1.7–7.7)
Neutrophils Relative %: 72 %
Platelets: 112 10*3/uL — ABNORMAL LOW (ref 150–400)
RBC: 3.6 MIL/uL — ABNORMAL LOW (ref 3.87–5.11)
RDW: 15.3 % (ref 11.5–15.5)
WBC: 6.8 10*3/uL (ref 4.0–10.5)
nRBC: 0 % (ref 0.0–0.2)

## 2023-12-03 LAB — BRAIN NATRIURETIC PEPTIDE: B Natriuretic Peptide: 439 pg/mL — ABNORMAL HIGH (ref 0.0–100.0)

## 2023-12-03 LAB — COMPREHENSIVE METABOLIC PANEL
ALT: 19 U/L (ref 0–44)
AST: 28 U/L (ref 15–41)
Albumin: 3.4 g/dL — ABNORMAL LOW (ref 3.5–5.0)
Alkaline Phosphatase: 87 U/L (ref 38–126)
Anion gap: 9 (ref 5–15)
BUN: 16 mg/dL (ref 8–23)
CO2: 21 mmol/L — ABNORMAL LOW (ref 22–32)
Calcium: 9.1 mg/dL (ref 8.9–10.3)
Chloride: 108 mmol/L (ref 98–111)
Creatinine, Ser: 0.67 mg/dL (ref 0.44–1.00)
GFR, Estimated: >60 mL/min (ref >60)
Glucose, Bld: 104 mg/dL — ABNORMAL HIGH (ref 70–99)
Potassium: 3.7 mmol/L (ref 3.5–5.1)
Sodium: 138 mmol/L (ref 135–145)
Total Bilirubin: 1.8 mg/dL — ABNORMAL HIGH (ref 0.0–1.2)
Total Protein: 6.9 g/dL (ref 6.5–8.1)

## 2023-12-03 LAB — RESP PANEL BY RT-PCR (RSV, FLU A&B, COVID)  RVPGX2
Influenza A by PCR: NEGATIVE
Influenza B by PCR: NEGATIVE
Resp Syncytial Virus by PCR: NEGATIVE
SARS Coronavirus 2 by RT PCR: NEGATIVE

## 2023-12-03 LAB — TROPONIN I (HIGH SENSITIVITY)
Troponin I (High Sensitivity): 18 ng/L — ABNORMAL HIGH (ref <18)
Troponin I (High Sensitivity): 18 ng/L — ABNORMAL HIGH (ref <18)

## 2023-12-03 MED ORDER — ACETAMINOPHEN 650 MG RE SUPP: 650.0000 mg | Freq: Four times a day (QID) | RECTAL | Status: DC | PRN

## 2023-12-03 MED ORDER — GUAIFENESIN-DM 100-10 MG/5ML PO SYRP: 5.0000 mL | ORAL_SOLUTION | ORAL | Status: DC | PRN

## 2023-12-03 MED ORDER — SILDENAFIL CITRATE 20 MG PO TABS
10.0000 mg | ORAL_TABLET | Freq: Two times a day (BID) | ORAL | Status: DC
  Administered 2023-12-03: 10 mg via ORAL
  Filled 2023-12-03 (×3): qty 1

## 2023-12-03 MED ORDER — ACETAMINOPHEN 325 MG PO TABS: 650.0000 mg | ORAL_TABLET | Freq: Four times a day (QID) | ORAL | Status: DC | PRN

## 2023-12-03 MED ORDER — ALBUTEROL SULFATE (2.5 MG/3ML) 0.083% IN NEBU
INHALATION_SOLUTION | RESPIRATORY_TRACT | Status: AC
  Filled 2023-12-03: qty 3

## 2023-12-03 MED ORDER — FAMOTIDINE 20 MG PO TABS
20.0000 mg | ORAL_TABLET | Freq: Every day | ORAL | Status: DC
  Administered 2023-12-03: 20 mg via ORAL
  Filled 2023-12-03: qty 1

## 2023-12-03 MED ORDER — IOHEXOL 350 MG/ML SOLN
75.0000 mL | Freq: Once | INTRAVENOUS | Status: AC | PRN
  Administered 2023-12-03: 75 mL via INTRAVENOUS

## 2023-12-03 MED ORDER — DIGOXIN 125 MCG PO TABS
0.0625 mg | ORAL_TABLET | Freq: Every day | ORAL | Status: DC
  Administered 2023-12-04: 0.0625 mg via ORAL
  Filled 2023-12-03: qty 1

## 2023-12-03 MED ORDER — LEVOTHYROXINE SODIUM 75 MCG PO TABS: 37.5000 ug | ORAL_TABLET | ORAL | Status: DC

## 2023-12-03 MED ORDER — FUROSEMIDE 10 MG/ML IJ SOLN
40.0000 mg | Freq: Once | INTRAMUSCULAR | Status: AC
  Administered 2023-12-03: 40 mg via INTRAVENOUS
  Filled 2023-12-03: qty 4

## 2023-12-03 MED ORDER — ALBUTEROL SULFATE (2.5 MG/3ML) 0.083% IN NEBU
2.5000 mg | INHALATION_SOLUTION | Freq: Once | RESPIRATORY_TRACT | Status: AC
  Administered 2023-12-03: 2.5 mg via RESPIRATORY_TRACT

## 2023-12-03 MED ORDER — ATORVASTATIN CALCIUM 10 MG PO TABS
20.0000 mg | ORAL_TABLET | Freq: Every day | ORAL | Status: DC
  Administered 2023-12-04: 20 mg via ORAL
  Filled 2023-12-03: qty 2

## 2023-12-03 MED ORDER — BISOPROLOL FUMARATE 5 MG PO TABS
2.5000 mg | ORAL_TABLET | Freq: Every day | ORAL | Status: DC
  Administered 2023-12-04: 2.5 mg via ORAL
  Filled 2023-12-03: qty 0.5

## 2023-12-03 MED ORDER — UMECLIDINIUM BROMIDE 62.5 MCG/ACT IN AEPB
1.0000 | INHALATION_SPRAY | Freq: Every day | RESPIRATORY_TRACT | Status: DC
  Filled 2023-12-03: qty 7

## 2023-12-03 MED ORDER — ZOLPIDEM TARTRATE 5 MG PO TABS
5.0000 mg | ORAL_TABLET | Freq: Every day | ORAL | Status: DC
  Administered 2023-12-03: 5 mg via ORAL
  Filled 2023-12-03: qty 1

## 2023-12-03 MED ORDER — ARFORMOTEROL TARTRATE 15 MCG/2ML IN NEBU
15.0000 ug | INHALATION_SOLUTION | Freq: Two times a day (BID) | RESPIRATORY_TRACT | Status: DC
  Administered 2023-12-04: 15 ug via RESPIRATORY_TRACT
  Filled 2023-12-03: qty 2

## 2023-12-03 NOTE — ED Provider Notes (Signed)
[place] EMERGENCY DEPARTMENT AT [facility] Provider Note  CSN: [id number] Arrival date & time: 12/03/23 1532  Chief Complaint(s) Shortness of Breath  HPI [name] is a 89 y.o. female with PMH chronic A-fib, CAD status post CABG, HTN, HLD, pulmonary hypertension who presents emerged part for evaluation of shortness of breath.  Symptoms have been worsening over the last 1 week and she has gained 4 pounds over the last 1 week despite oral Lasix at home.  Endorses persistent cough and orthopnea.  Went to urgent care this morning who found a new large pleural effusion and sent the patient to the emergency room for further evaluation.  Patient arrives with tachypnea but is saturating 98% on room air.  Denies chest pain, abdominal pain, nausea, vomiting, headache, fever or other systemic symptoms.   Past Medical History Past Medical History:  Diagnosis Date   Chronic atrial fibrillation (HCC)    a. Pt declined cardioversion.   Coronary artery disease    a. s/p CABGx2 06/2006 (LIMA-LAD, SVG-LCx at bifurcation of OM II/III). b. Nuc 11/2012: no evidence of ischemia, attenuation artifact c/w breast attenuation in anterior region, essentially low risk, EF 88%.   Essential hypertension    Hyperlipidemia    Hypothyroidism    Insomnia    on ambien, failed other meds   Iron deficiency anemia    Osteoarthritis    Osteopenia    involved on BMD 06/01/07 (T score-1.6); slightly worse in 3/13 (T score -1.6; FRAX 3%  and 12%)-plan rechcekin 2016   Uterine prolapse    post hysterectomy, Dr. Vincente Poli   UTI (lower urinary tract infection) 3/11   Patient Active Problem List   Diagnosis Date Noted   Pulmonary hypertension, unspecified (HCC) 02/17/2023   IDA (iron deficiency anemia) 07/02/2021   URI (upper respiratory infection) 10/17/2017   COPD (chronic obstructive pulmonary disease) (HCC) 01/14/2016   Arthritis 12/23/2015   Cataract 12/23/2015   Cardiac disease 12/23/2015   BP (high  blood pressure) 12/23/2015   Shortness of breath 12/11/2015   Pleural effusion 12/11/2015   Chronic atrial fibrillation (HCC)    Hyperlipidemia    Hypothyroidism    Essential hypertension    Mixed hyperlipidemia 11/08/2013   Disorder of bone and cartilage 10/29/2013   Coronary atherosclerosis of native coronary artery 10/29/2013   Hypothyroidism 10/29/2013   Other and unspecified angina pectoris 10/29/2013   Home Medication(s) Prior to Admission medications   Medication Sig Start Date End Date Taking? Authorizing Provider  acetaminophen (TYLENOL) 500 MG tablet Take 500 mg by mouth daily. Am and  early afternoon 1 tablet    [provider]  alendronate (FOSAMAX) 70 MG tablet Take 70 mg by mouth once a week.  04/01/20   [provider]  apixaban (ELIQUIS) 2.5 MG TABS tablet Take 1 tablet (2.5 mg total) by mouth 2 (two) times daily. 07/20/23   Sabharwal, Aditya, DO  atorvastatin (LIPITOR) 20 MG tablet TAKE 1 TABLET BY MOUTH EVERY DAY 01/25/23   Corky Crafts, MD  Bioflavonoid Products (ESTER-C) TABS Take 1 tablet by mouth daily.    [provider]  BIOTIN PO Take 10,000 mg by mouth daily.    [provider]  bisoprolol (ZEBETA) 5 MG tablet Take 2.5 mg by mouth daily.    [provider]  Calcium Carbonate Antacid (CALCIUM CARBONATE PO) Take 1 tablet by mouth 2 (two) times daily.    [provider]  digoxin (LANOXIN) 0.125 MG tablet Take 0.5 tablets (  0.0625 mg total) by mouth daily. 10/12/23   Sabharwal, Aditya, DO  famotidine (PEPCID) 20 MG tablet Take 20 mg by mouth at bedtime.    [provider]  ferrous sulfate 325 (65 FE) MG tablet Take 1 tablet (325 mg total) by mouth daily. 07/07/21   Imogene Burn, MD  furosemide (LASIX) 20 MG tablet Take 20 mg by mouth 2 (two) times daily.    [provider]  furosemide (LASIX) 20 MG tablet Take 1 tablet (20 mg total) by mouth 2 (two) times daily for 2 days. 10/12/23 10/14/23   Sabharwal, Aditya, DO  Hypromellose (ARTIFICIAL TEARS OP) Place 1 drop into both eyes every 6 (six) hours as needed (dry eyes).    [provider]  levothyroxine (SYNTHROID, LEVOTHROID) 75 MCG tablet Take 37.5-75 mcg by mouth See admin instructions. Take 75 mg  6 days a week and 37.5 mg every Friday in the morning before breakfast    [provider]  Misc Natural Products (OSTEO BI-FLEX ADV JOINT SHIELD PO) Take 1 tablet by mouth 2 (two) times daily.     [provider]  nitroGLYCERIN (NITROSTAT) 0.4 MG SL tablet Place 1 tablet (0.4 mg total) under the tongue every 5 (five) minutes as needed for chest pain. 10/12/23   Sabharwal, Aditya, DO  Omega-3 Fatty Acids (FISH OIL) 1200 MG CAPS Take 1,200 mg by mouth 2 (two) times daily.    [provider]  senna (SENNA LAX) 8.6 MG tablet Take 1 tablet (8.6 mg total) by mouth daily. 07/07/21   Imogene Burn, MD  sildenafil (REVATIO) 20 MG tablet Take 10 mg by mouth in the morning and at bedtime.    [provider]  Tiotropium Bromide-Olodaterol (STIOLTO RESPIMAT) 2.5-2.5 MCG/ACT AERS Inhale 2.5 mcg into the lungs daily at 6 (six) AM. INHALE 2 PUFFS BY MOUTH INTO THE LUNGS DAILY 07/06/23   Mannam, Colbert Coyer, MD  vitamin B-12 (CYANOCOBALAMIN) 1000 MCG tablet Take 1,000 mcg by mouth daily.    [provider]  zolpidem (AMBIEN) 5 MG tablet Take 5 mg by mouth at bedtime. 09/28/15   [provider]                                                                                                                                    Past Surgical History Past Surgical History:  Procedure Laterality Date   ABDOMINAL HYSTERECTOMY     APPENDECTOMY     CATARACT EXTRACTION, BILATERAL  11/02/2007   COLONOSCOPY WITH PROPOFOL N/A 11/30/2016   Procedure: COLONOSCOPY WITH PROPOFOL;  Surgeon: Charolett Bumpers, MD;  Location: [facility] ENDOSCOPY;  Service: Endoscopy;  Laterality: N/A;   CORONARY ARTERY BYPASS GRAFT  11/01/2005    ESOPHAGOGASTRODUODENOSCOPY N/A 11/30/2016   Procedure: ESOPHAGOGASTRODUODENOSCOPY (EGD);  Surgeon: Charolett Bumpers, MD;  Location: [facility];  Service: Endoscopy;  Laterality: N/A;   LIPOMA EXCISION     OVARIAN CYST  REMOVAL     RIGHT HEART CATH N/A 02/17/2023   Procedure: RIGHT HEART CATH;  Surgeon: Corky Crafts, MD;  Location: [facility] INVASIVE CV LAB;  Service: Cardiovascular;  Laterality: N/A;   ROTATOR CUFF REPAIR     TEAR DUCT PROBING     X2 on left and x1 on right   TONSILLECTOMY     Family History Family History  Problem Relation Age of Onset   Other Mother    Heart disease Mother    Syncope episode Mother    Ulcers Mother    Heart disease Father    Heart attack Father    CAD Father    Diabetes Father    Lymphoma Sister    Cancer Brother        throat   Brain cancer Brother    Throat cancer Brother    Diabetes Brother    Diabetes type II Son    Colon cancer Neg Hx    Esophageal cancer Neg Hx    Stomach cancer Neg Hx    Pancreatic cancer Neg Hx     Social History Social History   Tobacco Use   Smoking status: Never    Passive exposure: Past   Smokeless tobacco: Never  Vaping Use   Vaping status: Never Used  Substance Use Topics   Alcohol use: No    Alcohol/week: 0.0 standard drinks of alcohol   Drug use: No   Allergies Sulfa antibiotics  Review of Systems Review of Systems  Respiratory:  Positive for cough and shortness of breath.     Physical Exam Vital Signs  I have reviewed the triage vital signs BP 135/79 (BP Location: Left Arm)   Pulse 79   Temp 98.1 F (36.7 C)   Resp 17   SpO2 98%   Physical Exam Vitals and nursing note reviewed.  Constitutional:      General: She is not in acute distress.    Appearance: She is well-developed.  HENT:     Head: Normocephalic and atraumatic.  Eyes:     Conjunctiva/sclera: Conjunctivae normal.  Cardiovascular:     Rate and Rhythm: Normal rate and regular rhythm.     Heart sounds: No murmur  heard. Pulmonary:     Effort: Pulmonary effort is normal. No respiratory distress.     Breath sounds: Rales present.  Abdominal:     Palpations: Abdomen is soft.     Tenderness: There is no abdominal tenderness.  Musculoskeletal:        General: No swelling.     Cervical back: Neck supple.  Skin:    General: Skin is warm and dry.     Capillary Refill: Capillary refill takes less than 2 seconds.  Neurological:     Mental Status: She is alert.  Psychiatric:        Mood and Affect: Mood normal.     ED Results and Treatments Labs (all labs ordered are listed, but only abnormal results are displayed) Labs Reviewed  RESP PANEL BY RT-PCR (RSV, FLU A&B, COVID)  RVPGX2  COMPREHENSIVE METABOLIC PANEL  CBC WITH DIFFERENTIAL/PLATELET  BRAIN NATRIURETIC PEPTIDE  TROPONIN I (HIGH SENSITIVITY)  Radiology DG Chest 2 View Result Date: 12/03/2023 CLINICAL DATA:  Shortness of breath. EXAM: CHEST - 2 VIEW COMPARISON:  Mar 02, 2022. FINDINGS: Stable cardiomediastinal silhouette. Status post coronary artery bypass graft. Interval development of moderate right pleural effusion with probable right lower lobe atelectasis or infiltrate. Mild left basilar subsegmental atelectasis or edema is noted with small pleural effusion. Bony thorax is unremarkable. IMPRESSION: Moderate size right pleural effusion is noted with probable right lower lobe atelectasis or infiltrate. Mild left basilar atelectasis or edema is noted with small left pleural effusion. Electronically Signed   By: Lupita Raider M.D.   On: 12/03/2023 15:19    Pertinent labs & imaging results that were available during my care of the patient were reviewed by me and considered in my medical decision making (see MDM for details).  Medications Ordered in ED Medications - No data to display                                                                                                                                    Procedures Procedures  (including critical care time)  Medical Decision Making / ED Course   This patient presents to the ED for concern of ***, this involves an extensive number of treatment options, and is a complaint that carries with it a high risk of complications and morbidity.  The differential diagnosis includes ***  MDM: ***   Additional history obtained: -Additional history obtained from *** -External records from outside source obtained and reviewed including: Chart review including previous notes, labs, imaging, consultation notes   Lab Tests: -I ordered, reviewed, and interpreted labs.   The pertinent results include:   Labs Reviewed  RESP PANEL BY RT-PCR (RSV, FLU A&B, COVID)  RVPGX2  COMPREHENSIVE METABOLIC PANEL  CBC WITH DIFFERENTIAL/PLATELET  BRAIN NATRIURETIC PEPTIDE  TROPONIN I (HIGH SENSITIVITY)      EKG ***  EKG Interpretation Date/Time:    Ventricular Rate:    PR Interval:    QRS Duration:    QT Interval:    QTC Calculation:   R Axis:      Text Interpretation:           Imaging Studies ordered: I ordered imaging studies including *** I independently visualized and interpreted imaging. I agree with the radiologist interpretation   Medicines ordered and prescription drug management: No orders of the defined types were placed in this encounter.   -I have reviewed the patients home medicines and have made adjustments as needed  Critical interventions ***  Consultations Obtained: I requested consultation with the ***,  and discussed lab and imaging findings as well as pertinent plan - they recommend: ***   Cardiac Monitoring: The patient was maintained on a cardiac monitor.  I personally viewed and interpreted the cardiac monitored which showed an underlying rhythm of: ***  Social Determinants of Health:  Factors impacting patients care  include: ***  Reevaluation: After the interventions noted above, I reevaluated the patient and found that they have :{resolved/improved/worsened:23923::"improved"}  Co morbidities that complicate the patient evaluation  Past Medical History:  Diagnosis Date   Chronic atrial fibrillation (HCC)    a. Pt declined cardioversion.   Coronary artery disease    a. s/p CABGx2 06/2006 (LIMA-LAD, SVG-LCx at bifurcation of OM II/III). b. Nuc 11/2012: no evidence of ischemia, attenuation artifact c/w breast attenuation in anterior region, essentially low risk, EF 88%.   Essential hypertension    Hyperlipidemia    Hypothyroidism    Insomnia    on ambien, failed other meds   Iron deficiency anemia    Osteoarthritis    Osteopenia    involved on BMD 06/01/07 (T score-1.6); slightly worse in 3/13 (T score -1.6; FRAX 3%  and 12%)-plan rechcekin 2016   Uterine prolapse    post hysterectomy, Dr. Vincente Poli   UTI (lower urinary tract infection) 3/11      Dispostion: I considered admission for this patient, ***     Final Clinical Impression(s) / ED Diagnoses Final diagnoses:  None     @PCDICTATION @

## 2023-12-03 NOTE — ED Provider Notes (Signed)
[facility]    CSN: [id number] Arrival date & time: 12/03/23  1350      History   Chief Complaint Chief Complaint  Patient presents with   Shortness of Breath    HPI [name] is a 89 y.o. female.  Presenting with shortness of breath.  She was able to take most of her medications this morning however she was too tired to use her inhaler and she was nervous about urinating so she did not take her Lasix.  She has been feeling bad for the last week.  At this time a stat chest x-ray and I will give her a breathing treatment.   Shortness of Breath   Past Medical History:  Diagnosis Date   Chronic atrial fibrillation (HCC)    a. Pt declined cardioversion.   Coronary artery disease    a. s/p CABGx2 06/2006 (LIMA-LAD, SVG-LCx at bifurcation of OM II/III). b. Nuc 11/2012: no evidence of ischemia, attenuation artifact c/w breast attenuation in anterior region, essentially low risk, EF 88%.   Essential hypertension    Hyperlipidemia    Hypothyroidism    Insomnia    on ambien, failed other meds   Iron deficiency anemia    Osteoarthritis    Osteopenia    involved on BMD 06/01/07 (T score-1.6); slightly worse in 3/13 (T score -1.6; FRAX 3%  and 12%)-plan rechcekin 2016   Uterine prolapse    post hysterectomy, Dr. Vincente Poli   UTI (lower urinary tract infection) 3/11    Patient Active Problem List   Diagnosis Date Noted   Pulmonary hypertension, unspecified (HCC) 02/17/2023   IDA (iron deficiency anemia) 07/02/2021   URI (upper respiratory infection) 10/17/2017   COPD (chronic obstructive pulmonary disease) (HCC) 01/14/2016   Arthritis 12/23/2015   Cataract 12/23/2015   Cardiac disease 12/23/2015   BP (high blood pressure) 12/23/2015   Shortness of breath 12/11/2015   Pleural effusion 12/11/2015   Chronic atrial fibrillation (HCC)    Hyperlipidemia    Hypothyroidism    Essential hypertension    Mixed hyperlipidemia 11/08/2013   Disorder of bone and cartilage  10/29/2013   Coronary atherosclerosis of native coronary artery 10/29/2013   Hypothyroidism 10/29/2013   Other and unspecified angina pectoris 10/29/2013    Past Surgical History:  Procedure Laterality Date   ABDOMINAL HYSTERECTOMY     APPENDECTOMY     CATARACT EXTRACTION, BILATERAL  11/02/2007   COLONOSCOPY WITH PROPOFOL N/A 11/30/2016   Procedure: COLONOSCOPY WITH PROPOFOL;  Surgeon: Charolett Bumpers, MD;  Location: [facility];  Service: Endoscopy;  Laterality: N/A;   CORONARY ARTERY BYPASS GRAFT  11/01/2005   ESOPHAGOGASTRODUODENOSCOPY N/A 11/30/2016   Procedure: ESOPHAGOGASTRODUODENOSCOPY (EGD);  Surgeon: Charolett Bumpers, MD;  Location: [facility];  Service: Endoscopy;  Laterality: N/A;   LIPOMA EXCISION     OVARIAN CYST REMOVAL     RIGHT HEART CATH N/A 02/17/2023   Procedure: RIGHT HEART CATH;  Surgeon: Corky Crafts, MD;  Location: [facility] INVASIVE CV LAB;  Service: Cardiovascular;  Laterality: N/A;   ROTATOR CUFF REPAIR     TEAR DUCT PROBING     X2 on left and x1 on right   TONSILLECTOMY      OB History   No obstetric history on file.      Home Medications    Prior to Admission medications   Medication Sig Start Date End Date Taking? Authorizing Provider  acetaminophen (TYLENOL) 500 MG tablet Take 500 mg by mouth daily. Am and  early afternoon 1 tablet    [provider]  alendronate (FOSAMAX) 70 MG tablet Take 70 mg by mouth once a week.  04/01/20   [provider]  apixaban (ELIQUIS) 2.5 MG TABS tablet Take 1 tablet (2.5 mg total) by mouth 2 (two) times daily. 07/20/23   Sabharwal, Aditya, DO  atorvastatin (LIPITOR) 20 MG tablet TAKE 1 TABLET BY MOUTH EVERY DAY 01/25/23   Corky Crafts, MD  Bioflavonoid Products (ESTER-C) TABS Take 1 tablet by mouth daily.    [provider]  BIOTIN PO Take 10,000 mg by mouth daily.    [provider]  bisoprolol (ZEBETA) 5 MG tablet Take 2.5 mg by mouth daily.    [provider]   Calcium Carbonate Antacid (CALCIUM CARBONATE PO) Take 1 tablet by mouth 2 (two) times daily.    [provider]  digoxin (LANOXIN) 0.125 MG tablet Take 0.5 tablets (0.0625 mg total) by mouth daily. 10/12/23   Sabharwal, Aditya, DO  famotidine (PEPCID) 20 MG tablet Take 20 mg by mouth at bedtime.    [provider]  ferrous sulfate 325 (65 FE) MG tablet Take 1 tablet (325 mg total) by mouth daily. 07/07/21   Imogene Burn, MD  furosemide (LASIX) 20 MG tablet Take 20 mg by mouth 2 (two) times daily.    [provider]  furosemide (LASIX) 20 MG tablet Take 1 tablet (20 mg total) by mouth 2 (two) times daily for 2 days. 10/12/23 10/14/23  Sabharwal, Aditya, DO  Hypromellose (ARTIFICIAL TEARS OP) Place 1 drop into both eyes every 6 (six) hours as needed (dry eyes).    [provider]  levothyroxine (SYNTHROID, LEVOTHROID) 75 MCG tablet Take 37.5-75 mcg by mouth See admin instructions. Take 75 mg  6 days a week and 37.5 mg every Friday in the morning before breakfast    [provider]  Misc Natural Products (OSTEO BI-FLEX ADV JOINT SHIELD PO) Take 1 tablet by mouth 2 (two) times daily.     [provider]  nitroGLYCERIN (NITROSTAT) 0.4 MG SL tablet Place 1 tablet (0.4 mg total) under the tongue every 5 (five) minutes as needed for chest pain. 10/12/23   Sabharwal, Aditya, DO  Omega-3 Fatty Acids (FISH OIL) 1200 MG CAPS Take 1,200 mg by mouth 2 (two) times daily.    [provider]  senna (SENNA LAX) 8.6 MG tablet Take 1 tablet (8.6 mg total) by mouth daily. 07/07/21   Imogene Burn, MD  sildenafil (REVATIO) 20 MG tablet Take 10 mg by mouth in the morning and at bedtime.    [provider]  Tiotropium Bromide-Olodaterol (STIOLTO RESPIMAT) 2.5-2.5 MCG/ACT AERS Inhale 2.5 mcg into the lungs daily at 6 (six) AM. INHALE 2 PUFFS BY MOUTH INTO THE LUNGS DAILY 07/06/23   Mannam, Colbert Coyer, MD  vitamin B-12 (CYANOCOBALAMIN) 1000 MCG tablet Take  1,000 mcg by mouth daily.    [provider]  zolpidem (AMBIEN) 5 MG tablet Take 5 mg by mouth at bedtime. 09/28/15   [provider]    Family History Family History  Problem Relation Age of Onset   Other Mother    Heart disease Mother    Syncope episode Mother    Ulcers Mother    Heart disease Father    Heart attack Father    CAD Father    Diabetes Father    Lymphoma Sister    Cancer Brother        throat  Brain cancer Brother    Throat cancer Brother    Diabetes Brother    Diabetes type II Son    Colon cancer Neg Hx    Esophageal cancer Neg Hx    Stomach cancer Neg Hx    Pancreatic cancer Neg Hx     Social History Social History   Tobacco Use   Smoking status: Never    Passive exposure: Past   Smokeless tobacco: Never  Vaping Use   Vaping status: Never Used  Substance Use Topics   Alcohol use: No    Alcohol/week: 0.0 standard drinks of alcohol   Drug use: No     Allergies   Sulfa antibiotics   Review of Systems Review of Systems  Respiratory:  Positive for shortness of breath.      Physical Exam Triage Vital Signs ED Triage Vitals [12/03/23 1428]  Encounter Vitals Group     BP (!) 146/78     Systolic BP Percentile      Diastolic BP Percentile      Pulse Rate 76     Resp (!) 27     Temp 97.9 F (36.6 C)     Temp Source Oral     SpO2 96 %     Weight      Height      Head Circumference      Peak Flow      Pain Score      Pain Loc      Pain Education      Exclude from Growth Chart    No data found.  Updated Vital Signs BP (!) 146/78 (BP Location: Right Arm)   Pulse 76   Temp 97.9 F (36.6 C) (Oral)   Resp (!) 27   SpO2 96%   Visual Acuity Right Eye Distance:   Left Eye Distance:   Bilateral Distance:    Right Eye Near:   Left Eye Near:    Bilateral Near:     Physical Exam Constitutional:      Appearance: She is normal weight.  Cardiovascular:     Rate and Rhythm: Normal rate and regular rhythm.   Pulmonary:     Effort: Tachypnea present.     Breath sounds: Examination of the right-upper field reveals rhonchi. Examination of the left-upper field reveals rhonchi. Examination of the right-middle field reveals rhonchi. Examination of the left-middle field reveals rhonchi. Examination of the right-lower field reveals decreased breath sounds. Examination of the left-lower field reveals decreased breath sounds. Decreased breath sounds and rhonchi present.  Abdominal:     General: Bowel sounds are normal.     Palpations: Abdomen is soft.  Musculoskeletal:     Cervical back: Normal range of motion and neck supple.  Skin:    General: Skin is warm and dry.  Neurological:     General: No focal deficit present.     Mental Status: She is alert and oriented to person, place, and time.  Psychiatric:        Mood and Affect: Mood normal.      UC Treatments / Results  Labs (all labs ordered are listed, but only abnormal results are displayed) Labs Reviewed - No data to display  EKG   Radiology DG Chest 2 View Result Date: 12/03/2023 CLINICAL DATA:  Shortness of breath. EXAM: CHEST - 2 VIEW COMPARISON:  Mar 02, 2022. FINDINGS: Stable cardiomediastinal silhouette. Status post coronary artery bypass graft. Interval development of moderate right pleural effusion with probable right  lower lobe atelectasis or infiltrate. Mild left basilar subsegmental atelectasis or edema is noted with small pleural effusion. Bony thorax is unremarkable. IMPRESSION: Moderate size right pleural effusion is noted with probable right lower lobe atelectasis or infiltrate. Mild left basilar atelectasis or edema is noted with small left pleural effusion. Electronically Signed   By: Lupita Raider M.D.   On: 12/03/2023 15:19    Procedures Procedures (including critical care time)  Medications Ordered in UC Medications  albuterol (PROVENTIL) (2.5 MG/3ML) 0.083% nebulizer solution 2.5 mg (2.5 mg Nebulization Given 12/03/23  1443)    Initial Impression / Assessment and Plan / UC Course  I have reviewed the triage vital signs and the nursing notes.  Pertinent labs & imaging results that were available during my care of the patient were reviewed by me and considered in my medical decision making (see chart for details).   Chest x-ray shows a right pleural effusion so we will send her to the emergency department as she likely needs a thoracentesis.  Additionally she does have right lower lobe atelectasis and a left atelectasis with edema and a small left pleural effusion.       Final Clinical Impressions(s) / UC Diagnoses   Final diagnoses:  Shortness of breath  Pleural effusion     Discharge Instructions      Please go to the emergency department for further evaluation    ED Prescriptions   None    PDMP not reviewed this encounter.   Elmer Picker, NP 12/03/23 [phone]

## 2023-12-03 NOTE — ED Notes (Signed)
Patient is being discharged from the Urgent Care and sent to the Emergency Department via POV with family member . Per Elmer Picker NP, patient is in need of higher level of care due to pleural effusion. Patient is aware and verbalizes understanding of plan of care.  Vitals:   12/03/23 1428  BP: (!) 146/78  Pulse: 76  Resp: (!) 27  Temp: 97.9 F (36.6 C)  SpO2: 96%

## 2023-12-03 NOTE — ED Notes (Signed)
 Assumed pt care.

## 2023-12-03 NOTE — ED Triage Notes (Signed)
Pt had hoarse, sore throat, cough, and congestion for a few days and now have moved into her lungs. Pt has pulmonary hypertension and made breathing more difficulty. Covid test at home was negative.

## 2023-12-03 NOTE — Discharge Instructions (Addendum)
Please go to the emergency department for further evaluation.

## 2023-12-03 NOTE — ED Triage Notes (Signed)
Pt c.o sob for a while, went to UC, had xray done that shows a moderate size right pleural effusion with probable infiltrate. Pt also c.o productive cough. Pt denies fever. Resp labored in triage

## 2023-12-03 NOTE — H&P (Signed)
History and Physical    CALLY NYGARD ZOX:[id number] DOB: [birth year] DOA: 12/03/2023  PCP: Charlane Ferretti, DO  Patient coming from: Home  Chief Complaint: Shortness of breath  HPI: [name] is a 89 y.o. female with medical history significant of CAD status post CABG in 2007, persistent A-fib on Eliquis, hypertension, hyperlipidemia, pulmonary hypertension, severe tricuspid regurgitation, COPD, hypothyroidism, chronic anemia and thrombocytopenia.  She was seen by cardiology 2 weeks ago for dyspnea and volume overload.  She was previously taking Lasix 20 mg twice daily and had increase it to 40 mg twice daily x 3 days without significant improvement.  Cardiology had increased dose of Lasix to 60 mg twice daily x 3 days followed by previous dose of 20 mg twice daily.  Patient presented to the ED today complaining of shortness of breath, cough, orthopnea, and weight gain.  Went to urgent care this morning and found to have a moderate-sized right pleural effusion and sent to the ED for further evaluation.  Patient arrived with tachypnea but was saturating 98% on room air.  Labs notable for hemoglobin 10.6 (stable), platelet count 112k, creatinine 0.6, T. bili 1.8 but remainder of LFTs normal, troponin 18> 18, BNP 439, COVID/influenza/RSV PCR negative.  CTA chest negative for PE but showing moderate right and small left pleural effusions with bilateral basilar atelectasis/infiltration. Patient was given IV Lasix 40 mg.  TRH called to admit.  Patient is endorsing progressively worsening dyspnea, orthopnea, and cough.  States she was seen by her cardiologist 2 weeks ago and the dose of her home Lasix was increased to 60 mg twice daily for 3 days but then she was advised to go back to taking her previous dose of 20 mg twice daily which she has been doing.  She is also taking all her other cardiac medications as instructed and has not missed any doses but her symptoms have not improved.  She thinks she  might have gained about 3 or 4 pounds.  Denies fevers or chest pain.  She has no other complaints.  Review of Systems:  Review of Systems  All other systems reviewed and are negative.   Past Medical History:  Diagnosis Date   Chronic atrial fibrillation (HCC)    a. Pt declined cardioversion.   Coronary artery disease    a. s/p CABGx2 06/2006 (LIMA-LAD, SVG-LCx at bifurcation of OM II/III). b. Nuc 11/2012: no evidence of ischemia, attenuation artifact c/w breast attenuation in anterior region, essentially low risk, EF 88%.   Essential hypertension    Hyperlipidemia    Hypothyroidism    Insomnia    on ambien, failed other meds   Iron deficiency anemia    Osteoarthritis    Osteopenia    involved on BMD 06/01/07 (T score-1.6); slightly worse in 3/13 (T score -1.6; FRAX 3%  and 12%)-plan rechcekin 2016   Uterine prolapse    post hysterectomy, Dr. Vincente Poli   UTI (lower urinary tract infection) 3/11    Past Surgical History:  Procedure Laterality Date   ABDOMINAL HYSTERECTOMY     APPENDECTOMY     CATARACT EXTRACTION, BILATERAL  11/02/2007   COLONOSCOPY WITH PROPOFOL N/A 11/30/2016   Procedure: COLONOSCOPY WITH PROPOFOL;  Surgeon: Charolett Bumpers, MD;  Location: [facility];  Service: Endoscopy;  Laterality: N/A;   CORONARY ARTERY BYPASS GRAFT  11/01/2005   ESOPHAGOGASTRODUODENOSCOPY N/A 11/30/2016   Procedure: ESOPHAGOGASTRODUODENOSCOPY (EGD);  Surgeon: Charolett Bumpers, MD;  Location: [facility];  Service: Endoscopy;  Laterality: N/A;  LIPOMA EXCISION     OVARIAN CYST REMOVAL     RIGHT HEART CATH N/A 02/17/2023   Procedure: RIGHT HEART CATH;  Surgeon: Corky Crafts, MD;  Location: [facility] INVASIVE CV LAB;  Service: Cardiovascular;  Laterality: N/A;   ROTATOR CUFF REPAIR     TEAR DUCT PROBING     X2 on left and x1 on right   TONSILLECTOMY       reports that she has never smoked. She has been exposed to tobacco smoke. She has never used smokeless tobacco. She reports that she  does not drink alcohol and does not use drugs.  Allergies  Allergen Reactions   Sulfa Antibiotics Rash    Family History  Problem Relation Age of Onset   Other Mother    Heart disease Mother    Syncope episode Mother    Ulcers Mother    Heart disease Father    Heart attack Father    CAD Father    Diabetes Father    Lymphoma Sister    Cancer Brother        throat   Brain cancer Brother    Throat cancer Brother    Diabetes Brother    Diabetes type II Son    Colon cancer Neg Hx    Esophageal cancer Neg Hx    Stomach cancer Neg Hx    Pancreatic cancer Neg Hx     Prior to Admission medications   Medication Sig Start Date End Date Taking? Authorizing Provider  acetaminophen (TYLENOL) 500 MG tablet Take 500 mg by mouth daily. Am and  early afternoon 1 tablet    [provider]  alendronate (FOSAMAX) 70 MG tablet Take 70 mg by mouth once a week.  04/01/20   [provider]  apixaban (ELIQUIS) 2.5 MG TABS tablet Take 1 tablet (2.5 mg total) by mouth 2 (two) times daily. 07/20/23   Sabharwal, Aditya, DO  atorvastatin (LIPITOR) 20 MG tablet TAKE 1 TABLET BY MOUTH EVERY DAY 01/25/23   Corky Crafts, MD  Bioflavonoid Products (ESTER-C) TABS Take 1 tablet by mouth daily.    [provider]  BIOTIN PO Take 10,000 mg by mouth daily.    [provider]  bisoprolol (ZEBETA) 5 MG tablet Take 2.5 mg by mouth daily.    [provider]  Calcium Carbonate Antacid (CALCIUM CARBONATE PO) Take 1 tablet by mouth 2 (two) times daily.    [provider]  digoxin (LANOXIN) 0.125 MG tablet Take 0.5 tablets (0.0625 mg total) by mouth daily. 10/12/23   Sabharwal, Aditya, DO  famotidine (PEPCID) 20 MG tablet Take 20 mg by mouth at bedtime.    [provider]  ferrous sulfate 325 (65 FE) MG tablet Take 1 tablet (325 mg total) by mouth daily. 07/07/21   Imogene Burn, MD  furosemide (LASIX) 20 MG tablet Take 20 mg by mouth 2 (two) times daily.     [provider]  furosemide (LASIX) 20 MG tablet Take 1 tablet (20 mg total) by mouth 2 (two) times daily for 2 days. 10/12/23 10/14/23  Sabharwal, Aditya, DO  Hypromellose (ARTIFICIAL TEARS OP) Place 1 drop into both eyes every 6 (six) hours as needed (dry eyes).    [provider]  levothyroxine (SYNTHROID, LEVOTHROID) 75 MCG tablet Take 37.5-75 mcg by mouth See admin instructions. Take 75 mg  6 days a week and 37.5 mg every Friday in the morning before breakfast    [provider]  Misc Natural Products (OSTEO BI-FLEX ADV JOINT SHIELD PO) Take 1 tablet by mouth 2 (two) times daily.     [provider]  nitroGLYCERIN (NITROSTAT) 0.4 MG SL tablet Place 1 tablet (0.4 mg total) under the tongue every 5 (five) minutes as needed for chest pain. 10/12/23   Sabharwal, Aditya, DO  Omega-3 Fatty Acids (FISH OIL) 1200 MG CAPS Take 1,200 mg by mouth 2 (two) times daily.    [provider]  senna (SENNA LAX) 8.6 MG tablet Take 1 tablet (8.6 mg total) by mouth daily. 07/07/21   Imogene Burn, MD  sildenafil (REVATIO) 20 MG tablet Take 10 mg by mouth in the morning and at bedtime.    [provider]  Tiotropium Bromide-Olodaterol (STIOLTO RESPIMAT) 2.5-2.5 MCG/ACT AERS Inhale 2.5 mcg into the lungs daily at 6 (six) AM. INHALE 2 PUFFS BY MOUTH INTO THE LUNGS DAILY 07/06/23   Mannam, Colbert Coyer, MD  vitamin B-12 (CYANOCOBALAMIN) 1000 MCG tablet Take 1,000 mcg by mouth daily.    [provider]  zolpidem (AMBIEN) 5 MG tablet Take 5 mg by mouth at bedtime. 09/28/15   [provider]    Physical Exam: Vitals:   12/03/23 1656 12/03/23 1845 12/03/23 1930 12/03/23 1939  BP:  139/79 (!) 177/83   Pulse:  64 87   Resp:  (!) 21 (!) 29   Temp:    98.6 F (37 C)  TempSrc:    Oral  SpO2: 98% 95% 92%   Weight:    49.9 kg  Height:    5\' 2"  (1.575 m)    Physical Exam Vitals reviewed.  Constitutional:      General: She is not in acute  distress. HENT:     Head: Normocephalic and atraumatic.  Eyes:     Extraocular Movements: Extraocular movements intact.  Neck:     Comments: +JVD Cardiovascular:     Rate and Rhythm: Normal rate and regular rhythm.     Pulses: Normal pulses.  Pulmonary:     Effort: Pulmonary effort is normal. No respiratory distress.     Breath sounds: No wheezing or rales.  Abdominal:     General: Bowel sounds are normal. There is no distension.     Palpations: Abdomen is soft.     Tenderness: There is no abdominal tenderness. There is no guarding.  Musculoskeletal:     Cervical back: Normal range of motion.     Comments: Trace pitting edema of bilateral lower extremities  Skin:    General: Skin is warm and dry.  Neurological:     General: No focal deficit present.     Mental Status: She is alert and oriented to person, place, and time.     Labs on Admission: I have personally reviewed following labs and imaging studies  CBC: Recent Labs  Lab 12/03/23 1720  WBC 6.8  NEUTROABS 4.8  HGB 10.6*  HCT 33.2*  MCV 92.2  PLT 112*   Basic Metabolic Panel: Recent Labs  Lab 12/03/23 1720  NA 138  K 3.7  CL 108  CO2 21*  GLUCOSE 104*  BUN 16  CREATININE 0.67  CALCIUM 9.1   GFR: Estimated Creatinine Clearance: 37.6 mL/min (by C-G formula based on SCr of 0.67 mg/dL). Liver Function Tests: Recent Labs  Lab 12/03/23 1720  AST 28  ALT 19  ALKPHOS 87  BILITOT 1.8*  PROT 6.9  ALBUMIN 3.4*   No results for input(s): "LIPASE", "AMYLASE" in the last 168 hours. No  results for input(s): "AMMONIA" in the last 168 hours. Coagulation Profile: No results for input(s): "INR", "PROTIME" in the last 168 hours. Cardiac Enzymes: No results for input(s): "CKTOTAL", "CKMB", "CKMBINDEX", "TROPONINI" in the last 168 hours. BNP (last 3 results) No results for input(s): "PROBNP" in the last 8760 hours. HbA1C: No results for input(s): "HGBA1C" in the last 72 hours. CBG: No results for input(s):  "GLUCAP" in the last 168 hours. Lipid Profile: No results for input(s): "CHOL", "HDL", "LDLCALC", "TRIG", "CHOLHDL", "LDLDIRECT" in the last 72 hours. Thyroid Function Tests: No results for input(s): "TSH", "T4TOTAL", "FREET4", "T3FREE", "THYROIDAB" in the last 72 hours. Anemia Panel: No results for input(s): "VITAMINB12", "FOLATE", "FERRITIN", "TIBC", "IRON", "RETICCTPCT" in the last 72 hours. Urine analysis: No results found for: "COLORURINE", "APPEARANCEUR", "LABSPEC", "PHURINE", "GLUCOSEU", "HGBUR", "BILIRUBINUR", "KETONESUR", "PROTEINUR", "UROBILINOGEN", "NITRITE", "LEUKOCYTESUR"  Radiological Exams on Admission: CT Angio Chest PE W and/or Wo Contrast Result Date: 12/03/2023 CLINICAL DATA:  Pulmonary embolus suspected with high probability. EXAM: CT ANGIOGRAPHY CHEST WITH CONTRAST TECHNIQUE: Multidetector CT imaging of the chest was performed using the standard protocol during bolus administration of intravenous contrast. Multiplanar CT image reconstructions and MIPs were obtained to evaluate the vascular anatomy. RADIATION DOSE REDUCTION: This exam was performed according to the departmental dose-optimization program which includes automated exposure control, adjustment of the mA and/or kV according to patient size and/or use of iterative reconstruction technique. CONTRAST:  75mL OMNIPAQUE IOHEXOL 350 MG/ML SOLN COMPARISON:  Chest radiograph 12/03/2023.  CT chest 03/24/2023 FINDINGS: Cardiovascular: Technically adequate study with good opacification of the central and segmental pulmonary arteries. No focal filling defects. No evidence of significant pulmonary embolus. Cardiac enlargement with particular enlargement of the right heart. Reflux of contrast material into the hepatic veins and inferior vena cava suggesting right heart failure. No pericardial effusions. Normal caliber thoracic aorta. Calcification of the aorta and coronary arteries. Postoperative changes consistent with coronary bypass.  Mediastinum/Nodes: Esophagus is decompressed. No significant lymphadenopathy. Thyroid gland is unremarkable. Lungs/Pleura: Moderate right and small left pleural effusions. Bilateral basilar atelectasis with patchy infiltrates in the right lung, possibly compressive atelectasis or pneumonia. Secretions are demonstrated in the lower lobe bronchi. No pneumothorax. Upper Abdomen: Cholelithiasis.  No acute abnormalities. Musculoskeletal: Degenerative changes in the spine. Sternotomy wires. Review of the MIP images confirms the above findings. IMPRESSION: 1. No evidence of significant pulmonary embolus. 2. Cardiac enlargement with evidence of right heart failure. 3. Moderate right and small left pleural effusions with bilateral basilar atelectasis/infiltration. 4. Cholelithiasis. 5. Aortic atherosclerosis. Electronically Signed   By: Burman Nieves M.D.   On: 12/03/2023 19:13   DG Chest 2 View Result Date: 12/03/2023 CLINICAL DATA:  Shortness of breath. EXAM: CHEST - 2 VIEW COMPARISON:  Mar 02, 2022. FINDINGS: Stable cardiomediastinal silhouette. Status post coronary artery bypass graft. Interval development of moderate right pleural effusion with probable right lower lobe atelectasis or infiltrate. Mild left basilar subsegmental atelectasis or edema is noted with small pleural effusion. Bony thorax is unremarkable. IMPRESSION: Moderate size right pleural effusion is noted with probable right lower lobe atelectasis or infiltrate. Mild left basilar atelectasis or edema is noted with small left pleural effusion. Electronically Signed   By: Lupita Raider M.D.   On: 12/03/2023 15:19    EKG: Independently reviewed.  Rate controlled A-fib, QTc 531, no acute ischemic changes.  Assessment and Plan  Volume overload/bilateral pleural effusions Moderate mitral regurgitation Pulmonary hypertension, severe tricuspid regurgitation, right heart failure Right cath done in April 2024 was showing findings  consistent with  mild to moderate pulmonary hypertension. Echocardiogram done in December 2024 with preserved LV function, moderate MR, severe TR with a severely dilated RA, severely dilated RV, and moderately elevated pulmonary artery systolic pressure.  PFTs from 2017 showing moderate obstructive lung disease.  Patient was seen by cardiology 2 weeks ago for dyspnea and volume overload.  She was previously taking Lasix 20 mg twice daily and had increase it to 40 mg twice daily x 3 days without significant improvement.  Cardiology had increased dose of Lasix to 60 mg twice daily x 3 days followed by previous dose of 20 mg twice daily.  Patient presents to the ED today with complaints of progressively worsening dyspnea, cough, orthopnea, and weight gain.  BNP 439. CTA chest negative for PE but showing moderate right and small left pleural effusions with bilateral basilar atelectasis/infiltration.  Pneumonia less likely given no fever or leukocytosis.  COVID/influenza/RSV PCR negative.  Patient was given IV Lasix 40 mg in the ED.  Not hypoxic.  Consult cardiology in the morning.  She may need thoracentesis for pleural effusion if not improving with IV diuresis.  Monitor intake and output, daily weights.  Continue home sildenafil, digoxin, and bisoprolol.  QT prolongation Monitor potassium and magnesium levels, replace as needed.  Avoid QT prolonging drugs and follow-up repeat EKG in the morning.  Persistent A-fib Currently rate controlled.  Continue bisoprolol.  Holding Eliquis dose overnight as she may need thoracentesis if not improving.  CAD status post CABG Workup not suggestive of ACS and not endorsing chest pain.  Hypertension Continue bisoprolol  Hyperlipidemia Continue Lipitor.  COPD Stable, no signs of acute exacerbation.  Continue home inhaler.  Hypothyroidism Continue Synthroid.  Chronic anemia Hemoglobin stable, monitor labs.  Chronic thrombocytopenia Continue to monitor labs.  Chronic  insomnia Continue zolpidem at bedtime.  GERD Continue Pepcid.  DVT prophylaxis: SCDs Code Status: Full Code (discussed with the patient) Level of care: Telemetry bed Admission status: It is my clinical opinion that admission to INPATIENT is reasonable and necessary because of the expectation that this patient will require hospital care that crosses at least 2 midnights to treat this condition based on the medical complexity of the problems presented.  Given the aforementioned information, the predictability of an adverse outcome is felt to be significant.  John Giovanni MD [facility]  If 7PM-7AM, please contact night-coverage [email or web address]  12/03/2023, 9:05 PM

## 2023-12-04 ENCOUNTER — Telehealth: Payer: Self-pay | Admitting: Internal Medicine

## 2023-12-04 ENCOUNTER — Inpatient Hospital Stay (HOSPITAL_COMMUNITY): Payer: Medicare PPO

## 2023-12-04 DIAGNOSIS — I50813 Acute on chronic right heart failure: Secondary | ICD-10-CM

## 2023-12-04 DIAGNOSIS — Z9889 Other specified postprocedural states: Secondary | ICD-10-CM | POA: Diagnosis not present

## 2023-12-04 DIAGNOSIS — J449 Chronic obstructive pulmonary disease, unspecified: Secondary | ICD-10-CM | POA: Diagnosis not present

## 2023-12-04 DIAGNOSIS — I071 Rheumatic tricuspid insufficiency: Secondary | ICD-10-CM

## 2023-12-04 DIAGNOSIS — J90 Pleural effusion, not elsewhere classified: Secondary | ICD-10-CM | POA: Diagnosis not present

## 2023-12-04 DIAGNOSIS — J9811 Atelectasis: Secondary | ICD-10-CM | POA: Diagnosis not present

## 2023-12-04 DIAGNOSIS — I272 Pulmonary hypertension, unspecified: Secondary | ICD-10-CM | POA: Diagnosis not present

## 2023-12-04 DIAGNOSIS — J948 Other specified pleural conditions: Secondary | ICD-10-CM | POA: Diagnosis not present

## 2023-12-04 LAB — LACTATE DEHYDROGENASE, PLEURAL OR PERITONEAL FLUID: LD, Fluid: 76 U/L — ABNORMAL HIGH (ref 3–23)

## 2023-12-04 LAB — COMPREHENSIVE METABOLIC PANEL
ALT: 15 U/L (ref 0–44)
AST: 24 U/L (ref 15–41)
Albumin: 2.9 g/dL — ABNORMAL LOW (ref 3.5–5.0)
Alkaline Phosphatase: 81 U/L (ref 38–126)
Anion gap: 11 (ref 5–15)
BUN: 13 mg/dL (ref 8–23)
CO2: 23 mmol/L (ref 22–32)
Calcium: 8.6 mg/dL — ABNORMAL LOW (ref 8.9–10.3)
Chloride: 105 mmol/L (ref 98–111)
Creatinine, Ser: 0.82 mg/dL (ref 0.44–1.00)
GFR, Estimated: >60 mL/min (ref >60)
Glucose, Bld: 94 mg/dL (ref 70–99)
Potassium: 3.7 mmol/L (ref 3.5–5.1)
Sodium: 139 mmol/L (ref 135–145)
Total Bilirubin: 1.7 mg/dL — ABNORMAL HIGH (ref 0.0–1.2)
Total Protein: 5.9 g/dL — ABNORMAL LOW (ref 6.5–8.1)

## 2023-12-04 LAB — MAGNESIUM: Magnesium: 1.8 mg/dL (ref 1.7–2.4)

## 2023-12-04 LAB — CBC
HCT: 30.8 % — ABNORMAL LOW (ref 36.0–46.0)
Hemoglobin: 9.9 g/dL — ABNORMAL LOW (ref 12.0–15.0)
MCH: 29.8 pg (ref 26.0–34.0)
MCHC: 32.1 g/dL (ref 30.0–36.0)
MCV: 92.8 fL (ref 80.0–100.0)
Platelets: 115 10*3/uL — ABNORMAL LOW (ref 150–400)
RBC: 3.32 MIL/uL — ABNORMAL LOW (ref 3.87–5.11)
RDW: 15.2 % (ref 11.5–15.5)
WBC: 7.1 10*3/uL (ref 4.0–10.5)
nRBC: 0 % (ref 0.0–0.2)

## 2023-12-04 LAB — GLUCOSE, PLEURAL OR PERITONEAL FLUID: Glucose, Fluid: 91 mg/dL

## 2023-12-04 LAB — PROTEIN, PLEURAL OR PERITONEAL FLUID: Total protein, fluid: <3 g/dL

## 2023-12-04 LAB — BODY FLUID CELL COUNT WITH DIFFERENTIAL
Eos, Fluid: 1 %
Lymphs, Fluid: 22 %
Monocyte-Macrophage-Serous Fluid: 18 % — ABNORMAL LOW (ref 50–90)
Neutrophil Count, Fluid: 59 % — ABNORMAL HIGH (ref 0–25)
Total Nucleated Cell Count, Fluid: 525 uL (ref 0–1000)

## 2023-12-04 LAB — PROCALCITONIN: Procalcitonin: <0.1 ng/mL

## 2023-12-04 MED ORDER — TORSEMIDE 40 MG PO TABS: 40.0000 mg | ORAL_TABLET | Freq: Every day | ORAL | 0 refills | Status: DC

## 2023-12-04 MED ORDER — AMOXICILLIN-POT CLAVULANATE 875-125 MG PO TABS: 1.0000 | ORAL_TABLET | Freq: Two times a day (BID) | ORAL | 0 refills | Status: AC

## 2023-12-04 MED ORDER — LEVOTHYROXINE SODIUM 75 MCG PO TABS
75.0000 ug | ORAL_TABLET | ORAL | Status: DC
Start: 2023-12-04 — End: 2023-12-04
  Administered 2023-12-04: 75 ug via ORAL
  Filled 2023-12-04: qty 1

## 2023-12-04 MED ORDER — AMOXICILLIN-POT CLAVULANATE 875-125 MG PO TABS: 1.0000 | ORAL_TABLET | Freq: Two times a day (BID) | ORAL | Status: DC

## 2023-12-04 MED ORDER — LEVOTHYROXINE SODIUM 75 MCG PO TABS: 37.5000 ug | ORAL_TABLET | ORAL | Status: DC

## 2023-12-04 NOTE — Progress Notes (Incomplete)
PROGRESS NOTE    [name]  ZOX:[id number] DOB: [birth year] DOA: 12/03/2023 PCP: Charlane Ferretti, DO  Outpatient Specialists:     Brief Narrative:  *** Recent viral syndrome. Developed difficulty breathing subsequently.  CXR revealed right pleural effusion.   Assessment & Plan:   Principal Problem:   Acute CHF (congestive heart failure) (HCC) Active Problems:   Hypothyroidism   Mixed hyperlipidemia   Chronic atrial fibrillation (HCC)   Essential hypertension   Pleural effusion   COPD (chronic obstructive pulmonary disease) (HCC)   Pulmonary hypertension, unspecified (HCC)   ***   DVT prophylaxis: *** Code Status: *** Family Communication: *** Disposition Plan: ***   Consultants:  ***  Procedures:  ***  Antimicrobials:  ***    Subjective: ***  Objective: Vitals:   12/04/23 0600 12/04/23 0700 12/04/23 0727 12/04/23 0728  BP: 127/75 (!) 123/59  (!) 126/53  Pulse: 72 73  75  Resp: 20 (!) 24  (!) 24  Temp:    98.5 F (36.9 C)  TempSrc:    Oral  SpO2: 92% 97% 94% 94%  Weight:      Height:       No intake or output data in the 24 hours ending 12/04/23 1000 Filed Weights   12/03/23 1939  Weight: 49.9 kg    Examination:  General exam: Appears calm and comfortable.  Visible and engorged neck veins  Respiratory system: Clear to auscultation, but decreased air entry. Cardiovascular system: S1 & S2 heard  Gastrointestinal system: Abdomen is soft and nontender.   Central nervous system: Alert and oriented. Patient moves all extremities. Extremities: No leg edema   Data Reviewed: I have personally reviewed following labs and imaging studies  CBC: Recent Labs  Lab 12/03/23 1720 12/04/23 0542  WBC 6.8 7.1  NEUTROABS 4.8  --   HGB 10.6* 9.9*  HCT 33.2* 30.8*  MCV 92.2 92.8  PLT 112* 115*   Basic Metabolic Panel: Recent Labs  Lab 12/03/23 1720 12/04/23 0542  NA 138 139  K 3.7 3.7  CL 108 105  CO2 21* 23  GLUCOSE 104* 94  BUN 16 13   CREATININE 0.67 0.82  CALCIUM 9.1 8.6*  MG  --  1.8   GFR: Estimated Creatinine Clearance: 36.6 mL/min (by C-G formula based on SCr of 0.82 mg/dL). Liver Function Tests: Recent Labs  Lab 12/03/23 1720 12/04/23 0542  AST 28 24  ALT 19 15  ALKPHOS 87 81  BILITOT 1.8* 1.7*  PROT 6.9 5.9*  ALBUMIN 3.4* 2.9*   No results for input(s): "LIPASE", "AMYLASE" in the last 168 hours. No results for input(s): "AMMONIA" in the last 168 hours. Coagulation Profile: No results for input(s): "INR", "PROTIME" in the last 168 hours. Cardiac Enzymes: No results for input(s): "CKTOTAL", "CKMB", "CKMBINDEX", "TROPONINI" in the last 168 hours. BNP (last 3 results) No results for input(s): "PROBNP" in the last 8760 hours. HbA1C: No results for input(s): "HGBA1C" in the last 72 hours. CBG: No results for input(s): "GLUCAP" in the last 168 hours. Lipid Profile: No results for input(s): "CHOL", "HDL", "LDLCALC", "TRIG", "CHOLHDL", "LDLDIRECT" in the last 72 hours. Thyroid Function Tests: No results for input(s): "TSH", "T4TOTAL", "FREET4", "T3FREE", "THYROIDAB" in the last 72 hours. Anemia Panel: No results for input(s): "VITAMINB12", "FOLATE", "FERRITIN", "TIBC", "IRON", "RETICCTPCT" in the last 72 hours. Urine analysis: No results found for: "COLORURINE", "APPEARANCEUR", "LABSPEC", "PHURINE", "GLUCOSEU", "HGBUR", "BILIRUBINUR", "KETONESUR", "PROTEINUR", "UROBILINOGEN", "NITRITE", "LEUKOCYTESUR" Sepsis Labs: @LABRCNTIP (procalcitonin:4,lacticidven:4)  ) Recent Results (from the past 240  hours)  Resp panel by RT-PCR (RSV, Flu A&B, Covid) Anterior Nasal Swab     Status: None   Collection Time: 12/03/23  5:40 PM   Specimen: Anterior Nasal Swab  Result Value Ref Range Status   SARS Coronavirus 2 by RT PCR NEGATIVE NEGATIVE Final   Influenza A by PCR NEGATIVE NEGATIVE Final   Influenza B by PCR NEGATIVE NEGATIVE Final    Comment: (NOTE) The Xpert Xpress SARS-CoV-2/FLU/RSV plus assay is intended  as an aid in the diagnosis of influenza from Nasopharyngeal swab specimens and should not be used as a sole basis for treatment. Nasal washings and aspirates are unacceptable for Xpert Xpress SARS-CoV-2/FLU/RSV testing.  Fact Sheet for Patients: BloggerCourse.com  Fact Sheet for Healthcare Providers: SeriousBroker.it  This test is not yet approved or cleared by the [place] FDA and has been authorized for detection and/or diagnosis of SARS-CoV-2 by FDA under an Emergency Use Authorization (EUA). This EUA will remain in effect (meaning this test can be used) for the duration of the COVID-19 declaration under Section 564(b)(1) of the Act, 21 U.S.C. section 360bbb-3(b)(1), unless the authorization is terminated or revoked.     Resp Syncytial Virus by PCR NEGATIVE NEGATIVE Final    Comment: (NOTE) Fact Sheet for Patients: BloggerCourse.com  Fact Sheet for Healthcare Providers: SeriousBroker.it  This test is not yet approved or cleared by the [place] FDA and has been authorized for detection and/or diagnosis of SARS-CoV-2 by FDA under an Emergency Use Authorization (EUA). This EUA will remain in effect (meaning this test can be used) for the duration of the COVID-19 declaration under Section 564(b)(1) of the Act, 21 U.S.C. section 360bbb-3(b)(1), unless the authorization is terminated or revoked.  Performed at [facility], [address], [place], [place] [address]          Radiology Studies: CT Angio Chest PE W and/or Wo Contrast Result Date: 12/03/2023 CLINICAL DATA:  Pulmonary embolus suspected with high probability. EXAM: CT ANGIOGRAPHY CHEST WITH CONTRAST TECHNIQUE: Multidetector CT imaging of the chest was performed using the standard protocol during bolus administration of intravenous contrast. Multiplanar CT image reconstructions and MIPs were obtained  to evaluate the vascular anatomy. RADIATION DOSE REDUCTION: This exam was performed according to the departmental dose-optimization program which includes automated exposure control, adjustment of the mA and/or kV according to patient size and/or use of iterative reconstruction technique. CONTRAST:  75mL OMNIPAQUE IOHEXOL 350 MG/ML SOLN COMPARISON:  Chest radiograph 12/03/2023.  CT chest 03/24/2023 FINDINGS: Cardiovascular: Technically adequate study with good opacification of the central and segmental pulmonary arteries. No focal filling defects. No evidence of significant pulmonary embolus. Cardiac enlargement with particular enlargement of the right heart. Reflux of contrast material into the hepatic veins and inferior vena cava suggesting right heart failure. No pericardial effusions. Normal caliber thoracic aorta. Calcification of the aorta and coronary arteries. Postoperative changes consistent with coronary bypass. Mediastinum/Nodes: Esophagus is decompressed. No significant lymphadenopathy. Thyroid gland is unremarkable. Lungs/Pleura: Moderate right and small left pleural effusions. Bilateral basilar atelectasis with patchy infiltrates in the right lung, possibly compressive atelectasis or pneumonia. Secretions are demonstrated in the lower lobe bronchi. No pneumothorax. Upper Abdomen: Cholelithiasis.  No acute abnormalities. Musculoskeletal: Degenerative changes in the spine. Sternotomy wires. Review of the MIP images confirms the above findings. IMPRESSION: 1. No evidence of significant pulmonary embolus. 2. Cardiac enlargement with evidence of right heart failure. 3. Moderate right and small left pleural effusions with bilateral basilar atelectasis/infiltration. 4. Cholelithiasis. 5. Aortic  atherosclerosis. Electronically Signed   By: Burman Nieves M.D.   On: 12/03/2023 19:13   DG Chest 2 View Result Date: 12/03/2023 CLINICAL DATA:  Shortness of breath. EXAM: CHEST - 2 VIEW COMPARISON:  Mar 02, 2022.  FINDINGS: Stable cardiomediastinal silhouette. Status post coronary artery bypass graft. Interval development of moderate right pleural effusion with probable right lower lobe atelectasis or infiltrate. Mild left basilar subsegmental atelectasis or edema is noted with small pleural effusion. Bony thorax is unremarkable. IMPRESSION: Moderate size right pleural effusion is noted with probable right lower lobe atelectasis or infiltrate. Mild left basilar atelectasis or edema is noted with small left pleural effusion. Electronically Signed   By: Lupita Raider M.D.   On: 12/03/2023 15:19        Scheduled Meds:  arformoterol  15 mcg Nebulization BID   And   umeclidinium bromide  1 puff Inhalation Daily   atorvastatin  20 mg Oral Daily   bisoprolol  2.5 mg Oral Daily   digoxin  0.0625 mg Oral Daily   famotidine  20 mg Oral QHS   [START ON 12/10/2023] levothyroxine  37.5 mcg Oral Every Saturday   levothyroxine  75 mcg Oral Once per day on Sunday Monday Tuesday Wednesday Thursday Friday   sildenafil  10 mg Oral BID   zolpidem  5 mg Oral QHS   Continuous Infusions:   LOS: 1 day    Time spent: ***    Berton Mount, MD  [facility] Pager #: [phone] 7PM-7AM contact night coverage as above

## 2023-12-04 NOTE — Care Management (Signed)
Transition of Care [facility]) - Inpatient Brief Assessment   Patient Details  Name: [name] MRN: [id number] Date of Birth: [birth year]  Transition of [facility]) CM/SW Contact:    Lockie Pares, RN Phone Number: 12/04/2023, 2:41 PM   Clinical Narrative: Patient presented with SHOB oxygen saturations 96% on room air.  Sent from urgent care to ED, COVID negative. Had Plueral effusion, thoracentesis done. Able to breathe better. Is followed by HF CSW, needs to follow up with them and PCP post DC No needs identified TOC signing off.   Transition of Care Asessment: Insurance and Status: Insurance coverage has been reviewed Patient has primary care physician: Yes   Prior level of function:: Independent     Readmission risk has been reviewed: Yes Transition of care needs: no transition of care needs at this time

## 2023-12-04 NOTE — Discharge Instructions (Signed)
-  You were admitted with some fluid overload and probable pneumonia -You had fluid drained from chest that will be sent for testing -Please take lasix 40mg  twice daily for next 2 days then go back to once daily -Take augmentin x 1 week  Follow up as above, if breathing gets bad again please call the pulmonary office as we may need to investigate your pneumonia more closely.

## 2023-12-04 NOTE — Progress Notes (Signed)
12/04/2023 Full note to follow. Wonder if its a pneumonia (Hopefully), RLL is occluded Diagnostic/therapeutic tap on R Close AHF and pulm f/u 7 days augmentin Should be okay for home after CXR  Myrla Halsted MD PCCM

## 2023-12-04 NOTE — Procedures (Signed)
Thoracentesis  Procedure Note  [name]  [id number]  [birth year]  Date:12/04/23  Time:1:29 PM   Provider Performing:Nihal Doan C Katrinka Blazing   Procedure: Thoracentesis with imaging guidance (78295)  Indication(s) Pleural Effusion  Consent Risks of the procedure as well as the alternatives and risks of each were explained to the patient and/or caregiver.  Consent for the procedure was obtained and is signed in the bedside chart  Anesthesia Topical only with 1% lidocaine    Time Out Verified patient identification, verified procedure, site/side was marked, verified correct patient position, special equipment/implants available, medications/allergies/relevant history reviewed, required imaging and test results available.   Sterile Technique Maximal sterile technique including full sterile barrier drape, hand hygiene, sterile gown, sterile gloves, mask, hair covering, sterile ultrasound probe cover (if used).  Procedure Description Ultrasound was used to identify appropriate pleural anatomy for placement and overlying skin marked.  Area of drainage cleaned and draped in sterile fashion. Lidocaine was used to anesthetize the skin and subcutaneous tissue.  1300 cc's of bloody appearing fluid was drained from the right pleural space. Catheter then removed and bandaid applied to site.   Complications/Tolerance None; patient tolerated the procedure well. Chest X-ray is ordered to confirm no post-procedural complication.   EBL Minimal   Specimen(s) Pleural fluid

## 2023-12-04 NOTE — Discharge Summary (Signed)
 Physician Discharge Summary  Patient ID: [name] MRN: [id number] DOB/AGE: 02-21-1932 89 y.o.  Admit date: 12/03/2023 Discharge date: 12/04/2023  Admission Diagnoses:  Discharge Diagnoses:  Principal Problem:   Acute CHF (congestive heart failure) (HCC) Active Problems:   Hypothyroidism   Mixed hyperlipidemia   Chronic atrial fibrillation (HCC)   Essential hypertension   Pleural effusion   COPD (chronic obstructive pulmonary disease) (HCC)   Pulmonary hypertension, unspecified (HCC)   Discharged Condition: stable  Hospital Course: Patient is an 89 year old female, with past medical history significant for CAD, status post CABG in 2007, persistent A-fib on Eliquis, hypertension, hyperlipidemia, pulmonary hypertension, severe tricuspid regurgitation, COPD, hypothyroidism, chronic anemia and thrombocytopenia.  She was seen by cardiology 2 weeks ago for dyspnea and volume overload.  She was previously taking Lasix 20 mg twice daily and had increase it to 40 mg twice daily x 3 days without significant improvement.  Cardiology had increased dose of Lasix to 60 mg twice daily x 3 days followed by previous dose of 20 mg twice daily.  Patient presented with shortness of breath, cough, orthopnea, and weight gain.  Patient went to urgent care and was found to have moderate-sized right pleural effusion and sent to the ED for further evaluation.  Patient arrived with tachypnea but was saturating 98% on room air.  Labs notable for hemoglobin 10.6 (stable), platelet count 112k, creatinine 0.6, T. bili 1.8 but remainder of LFTs normal, troponin 18> 18, BNP 439, COVID/influenza/RSV PCR negative.  CTA chest negative for PE but showing moderate right and small left pleural effusions with bilateral basilar atelectasis/infiltration. Patient was given IV Lasix 40 mg.  Pulmonary and cardiology teams were consulted to assist with patient's management.  Adequate diuresis was advised.  Patient will follow-up with  pulmonary team and cardiology team on discharge.  Patient has been cleared for discharge by both pulmonary and cardiology teams.  Volume overload/bilateral pleural effusions Moderate mitral regurgitation Pulmonary hypertension, severe tricuspid regurgitation, right heart failure  QT prolongation Monitor potassium and magnesium levels, replace as needed.  Avoid QT prolonging drugs and follow-up repeat EKG in the morning.   Persistent A-fib Currently rate controlled.   Continue bisoprolol and Eliquis.     CAD status post CABG Workup not suggestive of ACS and not endorsing chest pain.   Hypertension Continue bisoprolol   Hyperlipidemia Continue Lipitor.   COPD Stable, no signs of acute exacerbation.  Continue home inhaler.   Hypothyroidism Continue Synthroid.   Chronic anemia Hemoglobin stable, monitor labs.   Chronic thrombocytopenia Continue to monitor labs.   Chronic insomnia Continue zolpidem at bedtime.   GERD Continue Pepcid.   Consults: cardiology and pulmonary/intensive care  Significant Diagnostic Studies:   Treatments: Diuresis.  The patient will follow-up with pulmonary and critical care team, as well as, cardiology team on discharge.  Discharge Exam: Blood pressure 137/71, pulse 80, temperature 98.5 F (36.9 C), temperature source Oral, resp. rate (!) 21, height 5\' 2"  (1.575 m), weight 49.9 kg, SpO2 100%.   Disposition: Discharge disposition: 01-Home or Self Care       Discharge Instructions     Diet - low sodium heart healthy   Complete by: As directed    Increase activity slowly   Complete by: As directed       Allergies as of 12/04/2023       Reactions   Sulfa Antibiotics Rash        Medication List     STOP taking these medications  BIOTIN PO   erythromycin ophthalmic ointment   Ester-C Tabs   furosemide 20 MG tablet Commonly known as: LASIX   OSTEO BI-FLEX ADV JOINT SHIELD PO       TAKE these medications     acetaminophen 500 MG tablet Commonly known as: TYLENOL Take 500 mg by mouth daily. May take 1 tablet in the afternoon   alendronate 70 MG tablet Commonly known as: FOSAMAX Take 70 mg by mouth once a week.   amoxicillin-clavulanate 875-125 MG tablet Commonly known as: AUGMENTIN Take 1 tablet by mouth every 12 (twelve) hours for 7 days.   apixaban 2.5 MG Tabs tablet Commonly known as: Eliquis Take 1 tablet (2.5 mg total) by mouth 2 (two) times daily.   ARTIFICIAL TEARS OP Place 1 drop into both eyes every 6 (six) hours as needed (dry eyes).   atorvastatin 20 MG tablet Commonly known as: LIPITOR TAKE 1 TABLET BY MOUTH EVERY DAY   bisoprolol 5 MG tablet Commonly known as: ZEBETA Take 2.5 mg by mouth daily.   CALCIUM CARBONATE PO Take 1 tablet by mouth 2 (two) times daily.   cyanocobalamin 1000 MCG tablet Commonly known as: VITAMIN B12 Take 1,000 mcg by mouth daily.   digoxin 0.125 MG tablet Commonly known as: LANOXIN Take 0.5 tablets (0.0625 mg total) by mouth daily.   famotidine 20 MG tablet Commonly known as: PEPCID Take 20 mg by mouth at bedtime.   ferrous sulfate 325 (65 FE) MG tablet Take 1 tablet (325 mg total) by mouth daily.   Fish Oil 1200 MG Caps Take 1,200 mg by mouth 2 (two) times daily.   levothyroxine 75 MCG tablet Commonly known as: SYNTHROID Take 37.5-75 mcg by mouth See admin instructions. Take 75 mg by mouth for 6 days a week and 37.5 mg every Saturday in the morning before breakfast   nitroGLYCERIN 0.4 MG SL tablet Commonly known as: NITROSTAT Place 1 tablet (0.4 mg total) under the tongue every 5 (five) minutes as needed for chest pain.   senna 8.6 MG tablet Commonly known as: Senna Lax Take 1 tablet (8.6 mg total) by mouth daily.   sildenafil 20 MG tablet Commonly known as: REVATIO Take 10 mg by mouth in the morning and at bedtime.   Stiolto Respimat 2.5-2.5 MCG/ACT Aers Generic drug: Tiotropium Bromide-Olodaterol Inhale 2.5 mcg  into the lungs daily at 6 (six) AM. INHALE 2 PUFFS BY MOUTH INTO THE LUNGS DAILY   Torsemide 40 MG Tabs Take 40 mg by mouth daily.   zolpidem 5 MG tablet Commonly known as: AMBIEN Take 5 mg by mouth at bedtime.        Follow-up Information     Sabharwal, Aditya, DO Follow up in 1 week(s).   Specialty: Cardiology Why: they will call you with appt Contact information: [address] [place] [address] [phone]         Chilton Greathouse, MD Follow up in 4 week(s).   Specialty: Pulmonary Disease Contact information: [address] [place] [address] [phone]                Time spent: 35 minutes.  SignedBarnetta Chapel 12/04/2023, 3:32 PM

## 2023-12-04 NOTE — Telephone Encounter (Signed)
2-4 week appt with APP or Mannam f/u abnormal CT scan after hospitalization thanks

## 2023-12-04 NOTE — Consult Note (Signed)
NAME:  ANNA-MARIE COLLER, MRN:  [id number], DOB:  05/21/1932, LOS: 1 ADMISSION DATE:  12/03/2023, CONSULTATION DATE:  12/03/22 REFERRING MD:  Dartha Lodge, CHIEF COMPLAINT:  SOB   History of Present Illness:  Very pleasant 89 year old woman w/ hx of TR and severe pulmonary HTN followed by AHF, COPD followed by our office who is presenting with 1 week of worsening cough, dyspnea, orthopnea.  Does not think she had any trauma. No obvious sick contacts.  No fevers/chills.  Cough is mostly nonproductive.  Very rare coughing after eating, does not think an event like that preceded this.  She tried going up on lasix but no improvement so went to urgent care.  CXR there with enlarge R effusion so sent to ER and admitted to [facility].  Given IV lasix with good response and feeling a little better.  PCCM consulted regarding thoracentesis.  Pertinent  Medical History   Past Medical History:  Diagnosis Date   Chronic atrial fibrillation (HCC)    a. Pt declined cardioversion.   Coronary artery disease    a. s/p CABGx2 06/2006 (LIMA-LAD, SVG-LCx at bifurcation of OM II/III). b. Nuc 11/2012: no evidence of ischemia, attenuation artifact c/w breast attenuation in anterior region, essentially low risk, EF 88%.   Essential hypertension    Hyperlipidemia    Hypothyroidism    Insomnia    on ambien, failed other meds   Iron deficiency anemia    Osteoarthritis    Osteopenia    involved on BMD 06/01/07 (T score-1.6); slightly worse in 3/13 (T score -1.6; FRAX 3%  and 12%)-plan rechcekin 2016   Uterine prolapse    post hysterectomy, Dr. Vincente Poli   UTI (lower urinary tract infection) 3/11     Significant Hospital Events: Including procedures, antibiotic start and stop dates in addition to other pertinent events   2/1 admit 2/2 pccm consult  Interim History / Subjective:  Consult, ros as below  Objective   Blood pressure 133/62, pulse 76, temperature 98.5 F (36.9 C), temperature source Oral, resp. rate (!) 23, height 5'  2" (1.575 m), weight 49.9 kg, SpO2 96%.       No intake or output data in the 24 hours ending 12/04/23 1330 Filed Weights   12/03/23 1939  Weight: 49.9 kg    Examination: General: very pleasant no distress HENT: MMM, trachea midline Lungs: diminished R, simple appearing effusion Cardiovascular: irregular, afib on monitor Abdomen: soft, +BS Extremities: no edema Neuro: moves to command, great strength GU: purewick clear urine  Imaging reviewed, RLL seems occluded whether intrinsically or extrinsically, fairly large R effusion  Resolved Hospital Problem list   N/A  Assessment & Plan:  Abnormal imaging: fluid that came out was pretty bloody, hopefully this is inflammatory or parapneumonic and not malignant.  Doubt related to heart failure given appearance.  Will treat as pneumonia and arrange f/u in office.  Pulm HTN on revatio  COPD not in flare  Hx of 2nd hand smoke exposure   - Augmentin x 7 days - Increased lasix x 2 days, close OP AHF f/u - 2-4 week pulm clinic f/u with repeat imaging; if persistent RLL collapse may need bronch - Updated Sabherwal, Ogbata, Mannam on above - Will call patient if cyto is concerning   Best Practice (right click and "Reselect all SmartList Selections" daily)  Per primary  Labs   CBC: Recent Labs  Lab 12/03/23 1720 12/04/23 0542  WBC 6.8 7.1  NEUTROABS 4.8  --  HGB 10.6* 9.9*  HCT 33.2* 30.8*  MCV 92.2 92.8  PLT 112* 115*    Basic Metabolic Panel: Recent Labs  Lab 12/03/23 1720 12/04/23 0542  NA 138 139  K 3.7 3.7  CL 108 105  CO2 21* 23  GLUCOSE 104* 94  BUN 16 13  CREATININE 0.67 0.82  CALCIUM 9.1 8.6*  MG  --  1.8   GFR: Estimated Creatinine Clearance: 36.6 mL/min (by C-G formula based on SCr of 0.82 mg/dL). Recent Labs  Lab 12/03/23 1720 12/04/23 0542  WBC 6.8 7.1    Liver Function Tests: Recent Labs  Lab 12/03/23 1720 12/04/23 0542  AST 28 24  ALT 19 15  ALKPHOS 87 81  BILITOT 1.8* 1.7*   PROT 6.9 5.9*  ALBUMIN 3.4* 2.9*   No results for input(s): "LIPASE", "AMYLASE" in the last 168 hours. No results for input(s): "AMMONIA" in the last 168 hours.  ABG    Component Value Date/Time   HCO3 22.5 02/17/2023 1137   TCO2 24 02/17/2023 1137   ACIDBASEDEF 2.0 02/17/2023 1137   O2SAT 66 02/17/2023 1137     Coagulation Profile: No results for input(s): "INR", "PROTIME" in the last 168 hours.  Cardiac Enzymes: No results for input(s): "CKTOTAL", "CKMB", "CKMBINDEX", "TROPONINI" in the last 168 hours.  HbA1C: No results found for: "HGBA1C"  CBG: No results for input(s): "GLUCAP" in the last 168 hours.  Review of Systems:    Positive Symptoms in bold:  Constitutional fevers, chills, weight loss, fatigue, anorexia, malaise  Eyes decreased vision, double vision, eye irritation  Ears, Nose, Mouth, Throat sore throat, trouble swallowing, sinus congestion  Cardiovascular chest pain, paroxysmal nocturnal dyspnea, lower ext edema, palpitations   Respiratory SOB, cough, DOE, hemoptysis, wheezing  Gastrointestinal nausea, vomiting, diarrhea  Genitourinary burning with urination, trouble urinating  Musculoskeletal joint aches, joint swelling, back pain  Integumentary  rashes, skin lesions  Neurological focal weakness, focal numbness, trouble speaking, headaches  Psychiatric depression, anxiety, confusion  Endocrine polyuria, polydipsia, cold intolerance, heat intolerance  Hematologic abnormal bruising, abnormal bleeding, unexplained nose bleeds  Allergic/Immunologic recurrent infections, hives, swollen lymph nodes     Past Medical History:  She,  has a past medical history of Chronic atrial fibrillation (HCC), Coronary artery disease, Essential hypertension, Hyperlipidemia, Hypothyroidism, Insomnia, Iron deficiency anemia, Osteoarthritis, Osteopenia, Uterine prolapse, and UTI (lower urinary tract infection) (3/11).   Surgical History:   Past Surgical History:   Procedure Laterality Date   ABDOMINAL HYSTERECTOMY     APPENDECTOMY     CATARACT EXTRACTION, BILATERAL  11/02/2007   COLONOSCOPY WITH PROPOFOL N/A 11/30/2016   Procedure: COLONOSCOPY WITH PROPOFOL;  Surgeon: Charolett Bumpers, MD;  Location: [facility];  Service: Endoscopy;  Laterality: N/A;   CORONARY ARTERY BYPASS GRAFT  11/01/2005   ESOPHAGOGASTRODUODENOSCOPY N/A 11/30/2016   Procedure: ESOPHAGOGASTRODUODENOSCOPY (EGD);  Surgeon: Charolett Bumpers, MD;  Location: [facility];  Service: Endoscopy;  Laterality: N/A;   LIPOMA EXCISION     OVARIAN CYST REMOVAL     RIGHT HEART CATH N/A 02/17/2023   Procedure: RIGHT HEART CATH;  Surgeon: Corky Crafts, MD;  Location: [facility] INVASIVE CV LAB;  Service: Cardiovascular;  Laterality: N/A;   ROTATOR CUFF REPAIR     TEAR DUCT PROBING     X2 on left and x1 on right   TONSILLECTOMY       Social History:   reports that she has never smoked. She has been exposed to tobacco smoke. She has never used smokeless  tobacco. She reports that she does not drink alcohol and does not use drugs.   Family History:  Her family history includes Brain cancer in her brother; CAD in her father; Cancer in her brother; Diabetes in her brother and father; Diabetes type II in her son; Heart attack in her father; Heart disease in her father and mother; Lymphoma in her sister; Other in her mother; Syncope episode in her mother; Throat cancer in her brother; Ulcers in her mother. There is no history of Colon cancer, Esophageal cancer, Stomach cancer, or Pancreatic cancer.   Allergies Allergies  Allergen Reactions   Sulfa Antibiotics Rash     Home Medications  Prior to Admission medications   Medication Sig Start Date End Date Taking? Authorizing Provider  acetaminophen (TYLENOL) 500 MG tablet Take 500 mg by mouth daily. May take 1 tablet in the afternoon   Yes [provider]  alendronate (FOSAMAX) 70 MG tablet Take 70 mg by mouth once a week.  04/01/20  Yes  [provider]  apixaban (ELIQUIS) 2.5 MG TABS tablet Take 1 tablet (2.5 mg total) by mouth 2 (two) times daily. 07/20/23  Yes Sabharwal, Aditya, DO  atorvastatin (LIPITOR) 20 MG tablet TAKE 1 TABLET BY MOUTH EVERY DAY 01/25/23  Yes Corky Crafts, MD  Bioflavonoid Products (ESTER-C) TABS Take 1 tablet by mouth daily.   Yes [provider]  BIOTIN PO Take 10,000 mg by mouth daily.   Yes [provider]  bisoprolol (ZEBETA) 5 MG tablet Take 2.5 mg by mouth daily.   Yes [provider]  Calcium Carbonate Antacid (CALCIUM CARBONATE PO) Take 1 tablet by mouth 2 (two) times daily.   Yes [provider]  digoxin (LANOXIN) 0.125 MG tablet Take 0.5 tablets (0.0625 mg total) by mouth daily. 10/12/23  Yes Sabharwal, Aditya, DO  erythromycin ophthalmic ointment Place 1 Application into the right eye See admin instructions. Apply to lower lid of right eye four times a day for 7 days   Yes [provider]  famotidine (PEPCID) 20 MG tablet Take 20 mg by mouth at bedtime.   Yes [provider]  ferrous sulfate 325 (65 FE) MG tablet Take 1 tablet (325 mg total) by mouth daily. 07/07/21  Yes Imogene Burn, MD  furosemide (LASIX) 20 MG tablet Take 20 mg by mouth 2 (two) times daily.   Yes [provider]  Hypromellose (ARTIFICIAL TEARS OP) Place 1 drop into both eyes every 6 (six) hours as needed (dry eyes).   Yes [provider]  levothyroxine (SYNTHROID, LEVOTHROID) 75 MCG tablet Take 37.5-75 mcg by mouth See admin instructions. Take 75 mg by mouth for 6 days a week and 37.5 mg every Saturday in the morning before breakfast   Yes [provider]  Misc Natural Products (OSTEO BI-FLEX ADV JOINT SHIELD PO) Take 1 tablet by mouth 2 (two) times daily.    Yes [provider]  nitroGLYCERIN (NITROSTAT) 0.4 MG SL tablet Place 1 tablet (0.4 mg total) under the tongue every 5 (five) minutes as needed for chest pain. 10/12/23   Yes Sabharwal, Aditya, DO  Omega-3 Fatty Acids (FISH OIL) 1200 MG CAPS Take 1,200 mg by mouth 2 (two) times daily.   Yes [provider]  senna (SENNA LAX) 8.6 MG tablet Take 1 tablet (8.6 mg total) by mouth daily. 07/07/21  Yes Imogene Burn, MD  sildenafil (REVATIO) 20 MG tablet Take 10 mg by mouth in the morning and at  bedtime.   Yes [provider]  Tiotropium Bromide-Olodaterol (STIOLTO RESPIMAT) 2.5-2.5 MCG/ACT AERS Inhale 2.5 mcg into the lungs daily at 6 (six) AM. INHALE 2 PUFFS BY MOUTH INTO THE LUNGS DAILY 07/06/23  Yes Mannam, Praveen, MD  vitamin B-12 (CYANOCOBALAMIN) 1000 MCG tablet Take 1,000 mcg by mouth daily.   Yes [provider]  zolpidem (AMBIEN) 5 MG tablet Take 5 mg by mouth at bedtime. 09/28/15  Yes [provider]     Critical care time: N/A

## 2023-12-04 NOTE — Consult Note (Signed)
CARDIOLOGY CONSULT NOTE    Patient ID: [name]; [id number]; 04/20/1932   Admit date: 12/03/2023 Date of Consult: 12/04/2023  Primary Care Provider: Charlane Ferretti, DO Primary Cardiologist:  Primary Electrophysiologist:     History of Present Illness:   Ms. [name]   Presented with cough, sore throat, DOE and minimal leg swelling for the last couple of days.  She went to urgent care for a chest x-ray and noted pleural effusion for which she was sent to the ER.  She underwent thoracentesis a couple of hours ago after which she felt a lot better, DOE completely resolved.  She was administered IV Lasix one-time dose yesterday evening and not on p.o. Lasix now.  She is doing great, no symptoms after thoracentesis was performed.  Past Medical History:  Diagnosis Date   Chronic atrial fibrillation (HCC)    a. Pt declined cardioversion.   Coronary artery disease    a. s/p CABGx2 06/2006 (LIMA-LAD, SVG-LCx at bifurcation of OM II/III). b. Nuc 11/2012: no evidence of ischemia, attenuation artifact c/w breast attenuation in anterior region, essentially low risk, EF 88%.   Essential hypertension    Hyperlipidemia    Hypothyroidism    Insomnia    on ambien, failed other meds   Iron deficiency anemia    Osteoarthritis    Osteopenia    involved on BMD 06/01/07 (T score-1.6); slightly worse in 3/13 (T score -1.6; FRAX 3%  and 12%)-plan rechcekin 2016   Uterine prolapse    post hysterectomy, Dr. Vincente Poli   UTI (lower urinary tract infection) 3/11    Past Surgical History:  Procedure Laterality Date   ABDOMINAL HYSTERECTOMY     APPENDECTOMY     CATARACT EXTRACTION, BILATERAL  11/02/2007   COLONOSCOPY WITH PROPOFOL N/A 11/30/2016   Procedure: COLONOSCOPY WITH PROPOFOL;  Surgeon: Charolett Bumpers, MD;  Location: [facility];  Service: Endoscopy;  Laterality: N/A;   CORONARY ARTERY BYPASS GRAFT  11/01/2005   ESOPHAGOGASTRODUODENOSCOPY N/A 11/30/2016   Procedure: ESOPHAGOGASTRODUODENOSCOPY  (EGD);  Surgeon: Charolett Bumpers, MD;  Location: [facility];  Service: Endoscopy;  Laterality: N/A;   LIPOMA EXCISION     OVARIAN CYST REMOVAL     RIGHT HEART CATH N/A 02/17/2023   Procedure: RIGHT HEART CATH;  Surgeon: Corky Crafts, MD;  Location: [facility] INVASIVE CV LAB;  Service: Cardiovascular;  Laterality: N/A;   ROTATOR CUFF REPAIR     TEAR DUCT PROBING     X2 on left and x1 on right   TONSILLECTOMY         Inpatient Medications: Scheduled Meds:  amoxicillin-clavulanate  1 tablet Oral Q12H   arformoterol  15 mcg Nebulization BID   And   umeclidinium bromide  1 puff Inhalation Daily   atorvastatin  20 mg Oral Daily   bisoprolol  2.5 mg Oral Daily   digoxin  0.0625 mg Oral Daily   famotidine  20 mg Oral QHS   [START ON 12/10/2023] levothyroxine  37.5 mcg Oral Every Saturday   levothyroxine  75 mcg Oral Once per day on Sunday Monday Tuesday Wednesday Thursday Friday   sildenafil  10 mg Oral BID   zolpidem  5 mg Oral QHS   Continuous Infusions:  PRN Meds: acetaminophen **OR** acetaminophen, guaiFENesin-dextromethorphan  Allergies:    Allergies  Allergen Reactions   Sulfa Antibiotics Rash    Social History:   Social History   Socioeconomic History   Marital status: Widowed    Spouse name: Not on  file   Number of children: Not on file   Years of education: Not on file   Highest education level: Not on file  Occupational History   Occupation: retired  Tobacco Use   Smoking status: Never    Passive exposure: Past   Smokeless tobacco: Never  Vaping Use   Vaping status: Never Used  Substance and Sexual Activity   Alcohol use: No    Alcohol/week: 0.0 standard drinks of alcohol   Drug use: No   Sexual activity: Not Currently  Other Topics Concern   Not on file  Social History Narrative   Not on file   Social Drivers of Health   Financial Resource Strain: Not on file  Food Insecurity: Not on file  Transportation Needs: Not on file  Physical Activity:  Not on file  Stress: Not on file  Social Connections: Not on file  Intimate Partner Violence: Not on file    Family History:    Family History  Problem Relation Age of Onset   Other Mother    Heart disease Mother    Syncope episode Mother    Ulcers Mother    Heart disease Father    Heart attack Father    CAD Father    Diabetes Father    Lymphoma Sister    Cancer Brother        throat   Brain cancer Brother    Throat cancer Brother    Diabetes Brother    Diabetes type II Son    Colon cancer Neg Hx    Esophageal cancer Neg Hx    Stomach cancer Neg Hx    Pancreatic cancer Neg Hx      ROS:  Please see the history of present illness.  ROS  All other ROS reviewed and negative.     Physical Exam/Data:   Vitals:   12/04/23 1044 12/04/23 1056 12/04/23 1202 12/04/23 1406  BP: 133/62   137/71  Pulse: 82 76  80  Resp: (!) 23   (!) 21  Temp:   98.5 F (36.9 C)   TempSrc:   Oral   SpO2: 96%   100%  Weight:      Height:       No intake or output data in the 24 hours ending 12/04/23 1530 Filed Weights   12/03/23 1939  Weight: 49.9 kg   Body mass index is 20.12 kg/m.  General:  Well nourished, well developed, in no acute distress HEENT: normal Lymph: no adenopathy Neck: no JVD Endocrine:  No thryomegaly Vascular: No carotid bruits; FA pulses 2+ bilaterally without bruits  Cardiac:  normal S1, S2; RRR; no murmur  Lungs:  clear to auscultation bilaterally, no wheezing, rhonchi or rales  Abd: soft, nontender, no hepatomegaly  Ext: no edema Musculoskeletal:  No deformities, BUE and BLE strength normal and equal Skin: warm and dry  Neuro:  CNs 2-12 intact, no focal abnormalities noted Psych:  Normal affect   EKG:  The EKG was personally reviewed and demonstrates: Atrial fibrillation, rate controlled Telemetry:  Telemetry was personally reviewed and demonstrates: Not on telemetry  Relevant CV Studies:   Laboratory Data:  Chemistry Recent Labs  Lab  12/03/23 1720 12/04/23 0542  NA 138 139  K 3.7 3.7  CL 108 105  CO2 21* 23  GLUCOSE 104* 94  BUN 16 13  CREATININE 0.67 0.82  CALCIUM 9.1 8.6*  GFRNONAA >60 >60  ANIONGAP 9 11    Recent Labs  Lab 12/03/23  1720 12/04/23 0542  PROT 6.9 5.9*  ALBUMIN 3.4* 2.9*  AST 28 24  ALT 19 15  ALKPHOS 87 81  BILITOT 1.8* 1.7*   Hematology Recent Labs  Lab 12/03/23 1720 12/04/23 0542  WBC 6.8 7.1  RBC 3.60* 3.32*  HGB 10.6* 9.9*  HCT 33.2* 30.8*  MCV 92.2 92.8  MCH 29.4 29.8  MCHC 31.9 32.1  RDW 15.3 15.2  PLT 112* 115*   Cardiac EnzymesNo results for input(s): "TROPONINI" in the last 168 hours. No results for input(s): "TROPIPOC" in the last 168 hours.  BNP Recent Labs  Lab 12/03/23 1720  BNP 439.0*    DDimer No results for input(s): "DDIMER" in the last 168 hours.  Radiology/Studies:  DG Chest Port 1 View Result Date: 12/04/2023 CLINICAL DATA:  Thoracentesis EXAM: PORTABLE CHEST 1 VIEW COMPARISON:  12/03/2023 FINDINGS: Stable heart size dense post sternotomy and CABG. Small right pleural effusion, decreased from prior. Improving aeration of the right lung base with streaky atelectasis. Similar mild left basilar atelectasis. No pneumothorax. IMPRESSION: Small right pleural effusion, decreased from prior. No pneumothorax. Electronically Signed   By: Duanne Guess D.O.   On: 12/04/2023 13:59   CT Angio Chest PE W and/or Wo Contrast Result Date: 12/03/2023 CLINICAL DATA:  Pulmonary embolus suspected with high probability. EXAM: CT ANGIOGRAPHY CHEST WITH CONTRAST TECHNIQUE: Multidetector CT imaging of the chest was performed using the standard protocol during bolus administration of intravenous contrast. Multiplanar CT image reconstructions and MIPs were obtained to evaluate the vascular anatomy. RADIATION DOSE REDUCTION: This exam was performed according to the departmental dose-optimization program which includes automated exposure control, adjustment of the mA and/or kV  according to patient size and/or use of iterative reconstruction technique. CONTRAST:  75mL OMNIPAQUE IOHEXOL 350 MG/ML SOLN COMPARISON:  Chest radiograph 12/03/2023.  CT chest 03/24/2023 FINDINGS: Cardiovascular: Technically adequate study with good opacification of the central and segmental pulmonary arteries. No focal filling defects. No evidence of significant pulmonary embolus. Cardiac enlargement with particular enlargement of the right heart. Reflux of contrast material into the hepatic veins and inferior vena cava suggesting right heart failure. No pericardial effusions. Normal caliber thoracic aorta. Calcification of the aorta and coronary arteries. Postoperative changes consistent with coronary bypass. Mediastinum/Nodes: Esophagus is decompressed. No significant lymphadenopathy. Thyroid gland is unremarkable. Lungs/Pleura: Moderate right and small left pleural effusions. Bilateral basilar atelectasis with patchy infiltrates in the right lung, possibly compressive atelectasis or pneumonia. Secretions are demonstrated in the lower lobe bronchi. No pneumothorax. Upper Abdomen: Cholelithiasis.  No acute abnormalities. Musculoskeletal: Degenerative changes in the spine. Sternotomy wires. Review of the MIP images confirms the above findings. IMPRESSION: 1. No evidence of significant pulmonary embolus. 2. Cardiac enlargement with evidence of right heart failure. 3. Moderate right and small left pleural effusions with bilateral basilar atelectasis/infiltration. 4. Cholelithiasis. 5. Aortic atherosclerosis. Electronically Signed   By: Burman Nieves M.D.   On: 12/03/2023 19:13   DG Chest 2 View Result Date: 12/03/2023 CLINICAL DATA:  Shortness of breath. EXAM: CHEST - 2 VIEW COMPARISON:  Mar 02, 2022. FINDINGS: Stable cardiomediastinal silhouette. Status post coronary artery bypass graft. Interval development of moderate right pleural effusion with probable right lower lobe atelectasis or infiltrate. Mild left  basilar subsegmental atelectasis or edema is noted with small pleural effusion. Bony thorax is unremarkable. IMPRESSION: Moderate size right pleural effusion is noted with probable right lower lobe atelectasis or infiltrate. Mild left basilar atelectasis or edema is noted with small left pleural effusion. Electronically Signed  By: Lupita Raider M.D.   On: 12/03/2023 15:19    Assessment and Plan:   DOE secondary to pleural effusion s/p thoracentesis today: After thoracentesis, DOE completely resolved.  She has no symptoms now.  Okay to resume p.o. Lasix 20 mg twice daily.  Pulmonology started antibiotics empirically to treat possible pneumonia.  Okay to go home from cardiology standpoint.  Pulmonary hypertension, severe TR, RV dysfunction: Continue digoxin 0.0625 mcg, bisoprolol 2.5 mg once daily, p.o. Lasix 20 mg twice daily and sildenafil 10 mg once daily.  Persistent A-fib: Continue bisoprolol 2.5 mg once daily and Eliquis 2.5 mg twice daily.   [facility] will sign off.   Medication Recommendations: Continue home medications Other recommendations (labs, testing, etc): None Follow up as an outpatient: Keep appointment with advanced heart failure in the last week of February 2025    For questions or updates, please contact [facility] Please consult [email or web address] for contact info under Cardiology/STEMI.   Signed, Herbert Deaner, MD 12/04/2023 3:30 PM

## 2023-12-04 NOTE — Care Management Obs Status (Signed)
MEDICARE OBSERVATION STATUS NOTIFICATION   Patient Details  Name: [name] MRN: [id number] Date of Birth: April 16, 1932   Medicare Observation Status Notification Given:  Yes    Lockie Pares, RN 12/04/2023, 3:38 PM

## 2023-12-05 LAB — TRIGLYCERIDES, BODY FLUIDS: Triglycerides, Fluid: 10 mg/dL

## 2023-12-06 LAB — CYTOLOGY - NON PAP

## 2023-12-07 ENCOUNTER — Telehealth: Payer: Self-pay

## 2023-12-07 NOTE — Telephone Encounter (Signed)
 Patient called to confirm her appointment for 12/13/23 at 3.15pm

## 2023-12-08 LAB — BODY FLUID CULTURE W GRAM STAIN: Culture: NO GROWTH

## 2023-12-09 ENCOUNTER — Other Ambulatory Visit: Payer: Self-pay | Admitting: Interventional Cardiology

## 2023-12-09 DIAGNOSIS — I10 Essential (primary) hypertension: Secondary | ICD-10-CM

## 2023-12-11 ENCOUNTER — Other Ambulatory Visit (HOSPITAL_COMMUNITY): Payer: Self-pay | Admitting: Cardiology

## 2023-12-12 ENCOUNTER — Telehealth: Payer: Self-pay | Admitting: Pulmonary Disease

## 2023-12-12 ENCOUNTER — Telehealth: Payer: Self-pay | Admitting: Cardiology

## 2023-12-12 NOTE — Telephone Encounter (Signed)
 Patient called answering service 12/09/2023 at 12:29pm and states she was diagnosed with pneumonia and would like to know if it is contagious.

## 2023-12-12 NOTE — Telephone Encounter (Signed)
 Called and spoke to patient. She stated that she had a recent admission and was dx with PNA. She is wanting to know if she is contagious. She completed course of abx yesterday. She has lingering cough. Cough is prod and clear in color.  SOB has improved. She feels much better overall.  Denied f/c/s or additional sx.   Dr. Waylan Haggard, please advise. Thanks

## 2023-12-12 NOTE — Telephone Encounter (Signed)
 Lvm to confirm appt for 12/13/23

## 2023-12-13 ENCOUNTER — Ambulatory Visit (HOSPITAL_BASED_OUTPATIENT_CLINIC_OR_DEPARTMENT_OTHER): Payer: Medicare PPO | Admitting: Cardiology

## 2023-12-13 ENCOUNTER — Other Ambulatory Visit
Admission: RE | Admit: 2023-12-13 | Discharge: 2023-12-13 | Disposition: A | Discharge status: Home / Self Care | Payer: Medicare PPO | Source: Ambulatory Visit | Attending: Cardiology | Admitting: Cardiology

## 2023-12-13 VITALS — BP 118/44 | HR 66 | Wt 103.8 lb

## 2023-12-13 DIAGNOSIS — I071 Rheumatic tricuspid insufficiency: Secondary | ICD-10-CM | POA: Diagnosis not present

## 2023-12-13 DIAGNOSIS — I509 Heart failure, unspecified: Secondary | ICD-10-CM | POA: Insufficient documentation

## 2023-12-13 DIAGNOSIS — I272 Pulmonary hypertension, unspecified: Secondary | ICD-10-CM | POA: Insufficient documentation

## 2023-12-13 LAB — BASIC METABOLIC PANEL
Anion gap: 11 (ref 5–15)
BUN: 18 mg/dL (ref 8–23)
CO2: 30 mmol/L (ref 22–32)
Calcium: 9.1 mg/dL (ref 8.9–10.3)
Chloride: 99 mmol/L (ref 98–111)
Creatinine, Ser: 0.76 mg/dL (ref 0.44–1.00)
GFR, Estimated: >60 mL/min (ref >60)
Glucose, Bld: 97 mg/dL (ref 70–99)
Potassium: 3.7 mmol/L (ref 3.5–5.1)
Sodium: 140 mmol/L (ref 135–145)

## 2023-12-13 LAB — BRAIN NATRIURETIC PEPTIDE: B Natriuretic Peptide: 249 pg/mL — ABNORMAL HIGH (ref 0.0–100.0)

## 2023-12-13 MED ORDER — TORSEMIDE 20 MG PO TABS: 20.0000 mg | ORAL_TABLET | Freq: Two times a day (BID) | ORAL | 5 refills | Status: DC

## 2023-12-13 NOTE — Patient Instructions (Signed)
Labs done today. We will contact you only if your labs are abnormal.  TAKE Torsemide 20mg  (1 tablet) by mouth 2 times daily.   No other medication changes were made. Please continue all current medications as prescribed.  Your physician recommends that you keep your scheduled follow-up appointment.  If you have any questions or concerns before your next appointment please send [place] a message through [place] or call our office at [phone].    TO LEAVE A MESSAGE FOR THE NURSE SELECT OPTION 2, PLEASE LEAVE A MESSAGE INCLUDING: YOUR NAME DATE OF BIRTH CALL BACK NUMBER REASON FOR CALL**this is important as we prioritize the call backs  YOU WILL RECEIVE A CALL BACK THE SAME DAY AS LONG AS YOU CALL BEFORE 4:00 PM   Do the following things EVERYDAY: Weigh yourself in the morning before breakfast. Write it down and keep it in a log. Take your medicines as prescribed Eat low salt foods--Limit salt (sodium) to 2000 mg per day.  Stay as active as you can everyday Limit all fluids for the day to less than 2 liters   At the [facility], you and your health needs are our priority. As part of our continuing mission to provide you with exceptional heart care, we have created designated Provider Care Teams. These Care Teams include your primary Cardiologist (physician) and Advanced Practice Providers (APPs- Physician Assistants and Nurse Practitioners) who all work together to provide you with the care you need, when you need it.   You may see any of the following providers on your designated Care Team at your next follow up: Dr Arvilla Meres Dr Marca Ancona Dr. Marcos Eke, NP Robbie Lis, [place] [facility] [place], [place] Brynda Peon, NP Karle Plumber, PharmD   Please be sure to bring in all your medications bottles to every appointment.    Thank you for choosing [facility]

## 2023-12-26 ENCOUNTER — Telehealth: Payer: Self-pay | Admitting: Cardiology

## 2023-12-26 NOTE — Telephone Encounter (Signed)
 Lvm to confirm appt for 12/27/23

## 2023-12-27 ENCOUNTER — Ambulatory Visit: Payer: Medicare PPO | Attending: Cardiology | Admitting: Cardiology

## 2023-12-27 VITALS — BP 134/64 | HR 56 | Wt 106.0 lb

## 2023-12-27 DIAGNOSIS — I272 Pulmonary hypertension, unspecified: Secondary | ICD-10-CM | POA: Diagnosis not present

## 2023-12-27 DIAGNOSIS — I4821 Permanent atrial fibrillation: Secondary | ICD-10-CM | POA: Diagnosis not present

## 2023-12-27 DIAGNOSIS — I071 Rheumatic tricuspid insufficiency: Secondary | ICD-10-CM | POA: Diagnosis not present

## 2023-12-27 MED ORDER — SILDENAFIL CITRATE 20 MG PO TABS: 10.0000 mg | ORAL_TABLET | Freq: Two times a day (BID) | ORAL | 3 refills | Status: DC

## 2023-12-27 NOTE — Progress Notes (Signed)
 [facility] NOTE  Referring Physician: Charlane Ferretti, DO  Primary Care: Charlane Ferretti, DO Primary Cardiologist: [name]  HPI: [name] is a 89 y.o. female with history of CABG in 2007 for left main disease by Dr. Dorris Fetch, persistent atrial fibrillation, hypertension, hyperlipidemia and history of hysterectomy in 2012 and recently diagnosed pulmonary hypertension and severe tricuspid regurgitation presenting today for follow up  Despite her age, [name] has remained very active.  She continues to go bowling and square dancing however now reports becoming increasingly limited due to exertional dyspnea.  Due to shortness of breath, patient had an echocardiogram that demonstrated severe tricuspid regurgitation with a severely dilated right atrium and severely elevated PA systolic pressures.  She had a right heart cath which further confirmed a PVR of 3-4 Wood units.    With additional of low dose GDMT, sildenafil & digoxin, she has continued to partake in a bowling league, drives, performs ADLs independently and lives a highly functional life.   - She is euvolemic on exam; currently taking lasix 20mg  BID.  - Reports dyspnea is much better now.  - Bowling regularly in her local league.   Current Outpatient Medications  Medication Sig Dispense Refill   acetaminophen (TYLENOL) 500 MG tablet Take 500 mg by mouth daily. May take 1 tablet in the afternoon     alendronate (FOSAMAX) 70 MG tablet Take 70 mg by mouth once a week.      apixaban (ELIQUIS) 2.5 MG TABS tablet Take 1 tablet (2.5 mg total) by mouth 2 (two) times daily. 180 tablet 3   atorvastatin (LIPITOR) 20 MG tablet TAKE 1 TABLET BY MOUTH EVERY DAY 90 tablet 2   bisoprolol (ZEBETA) 5 MG tablet TAKE 1 TABLET (5 MG TOTAL) BY MOUTH DAILY. 90 tablet 3   Calcium Carbonate Antacid (CALCIUM CARBONATE PO) Take 1 tablet by mouth 2 (two) times daily.     digoxin (LANOXIN) 0.125 MG tablet Take 0.5 tablets (0.0625  mg total) by mouth daily. 45 tablet 3   famotidine (PEPCID) 20 MG tablet Take 20 mg by mouth at bedtime.     ferrous sulfate 325 (65 FE) MG tablet Take 1 tablet (325 mg total) by mouth daily.     Hypromellose (ARTIFICIAL TEARS OP) Place 1 drop into both eyes every 6 (six) hours as needed (dry eyes).     levothyroxine (SYNTHROID, LEVOTHROID) 75 MCG tablet Take 37.5-75 mcg by mouth See admin instructions. Take 75 mg by mouth for 6 days a week and 37.5 mg every Saturday in the morning before breakfast     nitroGLYCERIN (NITROSTAT) 0.4 MG SL tablet Place 1 tablet (0.4 mg total) under the tongue every 5 (five) minutes as needed for chest pain. 25 tablet 0   Omega-3 Fatty Acids (FISH OIL) 1200 MG CAPS Take 1,200 mg by mouth 2 (two) times daily.     senna (SENNA LAX) 8.6 MG tablet Take 1 tablet (8.6 mg total) by mouth daily.     sildenafil (REVATIO) 20 MG tablet Take 10 mg by mouth in the morning and at bedtime.     Tiotropium Bromide-Olodaterol (STIOLTO RESPIMAT) 2.5-2.5 MCG/ACT AERS Inhale 2.5 mcg into the lungs daily at 6 (six) AM. INHALE 2 PUFFS BY MOUTH INTO THE LUNGS DAILY 12 g 3   torsemide (DEMADEX) 20 MG tablet Take 1 tablet (20 mg total) by mouth 2 (two) times daily. 60 tablet 5   vitamin B-12 (CYANOCOBALAMIN) 1000 MCG tablet Take 1,000 mcg  by mouth daily.     zolpidem (AMBIEN) 5 MG tablet Take 5 mg by mouth at bedtime.     No current facility-administered medications for this visit.    PHYSICAL EXAM: Vitals:   12/27/23 1120  BP: 134/64  Pulse: (!) 56  SpO2: 98%   GENERAL: NAD Lungs- cta CARDIAC:  JVP: 9 cm          Normal rate with regular rhythm. 3/6 systolic murmur.  Pulses 2+. no edema.  ABDOMEN: Soft, non-tender, non-distended.  EXTREMITIES: Warm and well perfused.  NEUROLOGIC: No obvious FND   DATA REVIEW  ECG: 02/25/23: Atrial fibrillation  As per my personal interpretation  ECHO: 02/08/23 LVEF 55%, mildly reduced RV, severely dilated RA. severe TR, moderate MR,  As per  my personal interpretation 10/12/23: LVEF 55%, mildly reduced RV function with moderate enlargement; severely dilated RA, severe TR, mild to mod MR, personally reviewed and interpreted.   CATH: 02/17/23:  Hemodynamic findings consistent with mild to moderate pulmonary hypertension.   Aortic saturation 98% noninvasively.  PA saturation 65%.  RA pressure 20/30, mean RA pressure 18 mmHg, RV pressure 58/3, PA pressure 57/19, mean PA pressure 34 mmHg, pulmonary capillary wedge pressure 18/29, mean pulmonary capillary wedge pressure 19.  Cardiac output 4.3 L/min, cardiac index 2.85, calculated PVR 3.48.   prominent V waves noted on the wedge tracing.   RA waveform suggestive of significant tricuspid regurgitation.   02/25/23: Pt ambulated 228.6 meters O2 Sat ranged 99 on 96 on room air HR ranged 77-86    ASSESSMENT & PLAN:  Pulmonary Hypertension, severe tricuspid regurgitation, RV dysfunction -Echocardiogram today 10/12/23 with preserved LV function; mild ot mod MR, severe TR with a severely dilated RA.  She has had a right heart cath with a PVR of 3.5 Wood units, RA pressure of 20-30 with large V waves and a PA mean of 34 mmHg. -She does have PFTs from 2017 that demonstrate obstructive lung disease that is at least moderate from secondhand smoking exposure.   - Continue sildenafil 10mg  TID; despite having underlying mitral regurgitation she has had improvement in dyspnea with the addition of sildenafil.  I believe this is likely due to reduction in TR /RV afterload.  - Will consider addition of low dose losartan in the future. - CT chest (03/24/23) w/o significant parenchymal lung disease.  - Continue digoxin 0.0610mcg; will repeat levels today.  - Continue bisoprolol to 2.5mg ,HR 61 today; will continue 2.5mg   - Euvolemic on exam; continue lasix 20mg  BID - Repeat BMP/BNP today  2.  Persistent atrial fibrillation -Continue bisoprolol -apixaban 2.5mg  BID -rate controlled today &  asymptomatic   3. CAD s/p CABG - followed by [name] -No CP.    [place] [facility] Mechanical Circulatory Support

## 2023-12-27 NOTE — Patient Instructions (Addendum)
 Medication Changes:  No Changes In Medications at this time.   Lab Work:  Go DOWN to LOWER LEVEL (LL) to have your blood work completed inside of [employer] office.  We will only call you if the results are abnormal or if the provider would like to make medication changes.  Follow-Up in: AS SCHEDULED WITH DR. Gasper Lloyd   If you have any questions or concerns before your next appointment please send [place] a message through mychart or call our office at [phone] Monday-Friday 8 am-5 pm.   If you have an urgent need after hours on the weekend please call your Primary Cardiologist or the [facility] in [place] at [phone].   At the [facility], you and your health needs are our priority. We have a designated team specialized in the treatment of Heart Failure. This Care Team includes your primary Heart Failure Specialized Cardiologist (physician), Advanced Practice Providers (APPs- Physician Assistants and Nurse Practitioners), and Pharmacist who all work together to provide you with the care you need, when you need it.   You may see any of the following providers on your designated Care Team at your next follow up:  Dr. Arvilla Meres Dr. Marca Ancona Dr. Dorthula Nettles Dr. Theresia Bough Tonye Becket, NP Robbie Lis, [place] [address] [place], [place] Brynda Peon, NP [place] Lee, NP Clarisa Kindred, NP Enos Fling, PharmD

## 2023-12-28 LAB — BASIC METABOLIC PANEL
BUN/Creatinine Ratio: 18 (ref 12–28)
BUN: 17 mg/dL (ref 8–27)
CO2: 25 mmol/L (ref 20–29)
Calcium: 9.6 mg/dL (ref 8.7–10.3)
Chloride: 104 mmol/L (ref 96–106)
Creatinine, Ser: 0.95 mg/dL (ref 0.57–1.00)
Glucose: 84 mg/dL (ref 70–99)
Potassium: 3.8 mmol/L (ref 3.5–5.2)
Sodium: 144 mmol/L (ref 134–144)
eGFR: 57 mL/min/{1.73_m2} — ABNORMAL LOW (ref >59)

## 2023-12-28 LAB — BRAIN NATRIURETIC PEPTIDE: BNP: 264.8 pg/mL — ABNORMAL HIGH (ref 0.0–100.0)

## 2024-01-03 DIAGNOSIS — M8588 Other specified disorders of bone density and structure, other site: Secondary | ICD-10-CM | POA: Diagnosis not present

## 2024-01-04 ENCOUNTER — Encounter: Payer: Self-pay | Admitting: Nurse Practitioner

## 2024-01-04 ENCOUNTER — Ambulatory Visit (INDEPENDENT_AMBULATORY_CARE_PROVIDER_SITE_OTHER)

## 2024-01-04 ENCOUNTER — Ambulatory Visit: Payer: Medicare PPO | Admitting: Nurse Practitioner

## 2024-01-04 ENCOUNTER — Other Ambulatory Visit (INDEPENDENT_AMBULATORY_CARE_PROVIDER_SITE_OTHER)

## 2024-01-04 VITALS — BP 113/59 | HR 62 | Ht 62.0 in | Wt 108.0 lb

## 2024-01-04 DIAGNOSIS — K921 Melena: Secondary | ICD-10-CM | POA: Diagnosis not present

## 2024-01-04 DIAGNOSIS — D649 Anemia, unspecified: Secondary | ICD-10-CM | POA: Diagnosis not present

## 2024-01-04 DIAGNOSIS — J90 Pleural effusion, not elsewhere classified: Secondary | ICD-10-CM

## 2024-01-04 DIAGNOSIS — I272 Pulmonary hypertension, unspecified: Secondary | ICD-10-CM

## 2024-01-04 DIAGNOSIS — J449 Chronic obstructive pulmonary disease, unspecified: Secondary | ICD-10-CM

## 2024-01-04 DIAGNOSIS — J9811 Atelectasis: Secondary | ICD-10-CM | POA: Diagnosis not present

## 2024-01-04 DIAGNOSIS — I482 Chronic atrial fibrillation, unspecified: Secondary | ICD-10-CM

## 2024-01-04 DIAGNOSIS — I50812 Chronic right heart failure: Secondary | ICD-10-CM

## 2024-01-04 LAB — BASIC METABOLIC PANEL
BUN: 19 mg/dL (ref 6–23)
CO2: 27 meq/L (ref 19–32)
Calcium: 9.4 mg/dL (ref 8.4–10.5)
Chloride: 105 meq/L (ref 96–112)
Creatinine, Ser: 1.04 mg/dL (ref 0.40–1.20)
GFR: 47.57 mL/min — ABNORMAL LOW (ref >60.00)
Glucose, Bld: 122 mg/dL — ABNORMAL HIGH (ref 70–99)
Potassium: 3.3 meq/L — ABNORMAL LOW (ref 3.5–5.1)
Sodium: 142 meq/L (ref 135–145)

## 2024-01-04 LAB — BRAIN NATRIURETIC PEPTIDE: Pro B Natriuretic peptide (BNP): 433 pg/mL — ABNORMAL HIGH (ref 0.0–100.0)

## 2024-01-04 MED ORDER — POTASSIUM CHLORIDE 20 MEQ PO PACK: 20.0000 meq | PACK | Freq: Every day | ORAL | 0 refills | Status: DC

## 2024-01-04 NOTE — Assessment & Plan Note (Signed)
 Reported by patient. Seems to be related to constipation/possible hemorrhoids; however, she is on chronic AC and higher risk for GI bleed. Recheck CBC today to ensure this is stable. Advised her to start stool softener with docusate and recommended she contact PCP to consider further workup. ED precautions reviewed.

## 2024-01-04 NOTE — Assessment & Plan Note (Signed)
 Group 2/3. See above. Follow up with cardiology as scheduled. Monitor weights/swelling at home

## 2024-01-04 NOTE — Progress Notes (Signed)
 @Patient  ID: [name], female    DOB: Feb 27, 1932, 89 y.o.   MRN: [id number]  Chief Complaint  Patient presents with   Follow-up    Hospital follow up    Referring provider: Charlane Ferretti, DO  HPI: 89 year old female, never smoker followed for moderate COPD and PH. She is a patient of Dr. Shirlee More and last seen in office 08/11/2023. Past medical history significant for RV failure, a fib on Eliquis, HTN, severe tricuspid regurgitation, hypothyroid, HLD, IDA.   TEST/EVENTS:  12/17/2015 PFT: FVC 125, FEV1 65, ratio 44, TLC 103, DLCOcor 74.  10/12/2023 echo: EF 60-65%. Mild LVH. Dilated RV and RA. Mildly reduced RV function, severely enlarged. Moderately elevated PASP. LA mildly dilated. RA severely dilated. Moderate MR. TR severe.  12/03/2023 CTA chest: cardiac enlargement with particular enlargement of right heart. S/p CABG. Esophagus decompressed. Moderate right and small left pleural effusions. B/l basilar atelectasis with patchy infiltrates in right lung, atelectasis vs pna.  12/04/2023 CXR: post thoracentesis. Small right effusion, decreased.   08/11/2023: Ov with Dr. Isaiah Serge. Reports SOB with minimal exertion, such as walking her dog. No issues with bowling. Currently managed for PH with sildenafil, which caused some dizziness so now she's taking a half dose. Follows with cardiology. Uses Stiolto for COPD. She does have leg swelling, which she takes lasix for and wearing compression stockings. Never smoker. Has a diagnosis of COPD on record but unsure if PFT is accurate as there is an abnormal, disproportionately blunted expiratory flow loop suggestive of poor effort. Can repeat PFT but pt declined. Continue Stiolto. B/l effusions with partial loculation on the right; does not appear infected. Suspect this may be from CHF. Confinue lasix. Alpha 1 MS phenotype, normal levels.   01/04/2024: Today - follow up Patient presents today for follow up with her son. She was seen in the ED 2/1 for 1  week worsening cough, dyspnea, orthopnea. She had tried increasing lasix at home but no improvement so she went to UC where CXR showed a large right effusion. She was sent to the ED for further evaluation/management. She had thoracentesis with bloody fluid. Concern for parapneumonic vs inflammatory effusion given appearance. She was treated with augmentin for 7 days and advised to increase lasix for 2 days. Cytology was negative for malignancy. She was discharged home. Since then, she's seen cardiology who changed her to torsemide twice daily.  She tells me today that she was feeling better but over the past few days, she's had some more shortness of breath with activity. She also feels like her abdomen is a little more bloated. Her legs are a little swollen but no where nearly how they were in January. Her weight is up about 5 lb from her dry weight of 5 lb. She admits she hasn't been taking her lasix daily. She denies any cough, wheezing, PND, chest pain, palpitations, hemoptysis, fevers, chills.  She does also tell me she's been having some blood in her stool since she started the lasix. She says it's a small amount and someone told her it was probably hemorrhoids. She does occasionally have some soreness when she wipes. She does admit she doesn't drink much water. She's not taking anything over the counter. She is on Eliquis. No lightheadedness/dizziness, abdominal pain, weight loss, anorexia.   Allergies  Allergen Reactions   Sulfa Antibiotics Rash    Immunization History  Administered Date(s) Administered   Fluad Quad(high Dose 65+) 07/05/2019, 08/03/2022   Influenza Split 07/16/2015  Influenza, High Dose Seasonal PF 07/24/2014, 07/21/2016, 08/03/2017, 07/03/2018   Influenza, Quadrivalent, Recombinant, Inj, Pf 07/13/2020, 07/13/2021   PFIZER(Purple Top)SARS-COV-2 Vaccination 11/27/2019, 12/18/2019, 09/02/2020   Pneumococcal Conjugate-13 08/01/2017   Tdap 07/24/2022   Zoster  Recombinant(Shingrix) 04/19/2018, 06/21/2018    Past Medical History:  Diagnosis Date   Chronic atrial fibrillation (HCC)    a. Pt declined cardioversion.   Coronary artery disease    a. s/p CABGx2 06/2006 (LIMA-LAD, SVG-LCx at bifurcation of OM II/III). b. Nuc 11/2012: no evidence of ischemia, attenuation artifact c/w breast attenuation in anterior region, essentially low risk, EF 88%.   Essential hypertension    Hyperlipidemia    Hypothyroidism    Insomnia    on ambien, failed other meds   Iron deficiency anemia    Osteoarthritis    Osteopenia    involved on BMD 06/01/07 (T score-1.6); slightly worse in 3/13 (T score -1.6; FRAX 3%  and 12%)-plan rechcekin 2016   Uterine prolapse    post hysterectomy, Dr. Vincente Poli   UTI (lower urinary tract infection) 3/11    Tobacco History: Social History   Tobacco Use  Smoking Status Never   Passive exposure: Past  Smokeless Tobacco Never   Counseling given: Not Answered   Outpatient Medications Prior to Visit  Medication Sig Dispense Refill   acetaminophen (TYLENOL) 500 MG tablet Take 500 mg by mouth daily. May take 1 tablet in the afternoon     alendronate (FOSAMAX) 70 MG tablet Take 70 mg by mouth once a week.      apixaban (ELIQUIS) 2.5 MG TABS tablet Take 1 tablet (2.5 mg total) by mouth 2 (two) times daily. 180 tablet 3   atorvastatin (LIPITOR) 20 MG tablet TAKE 1 TABLET BY MOUTH EVERY DAY 90 tablet 2   bisoprolol (ZEBETA) 5 MG tablet TAKE 1 TABLET (5 MG TOTAL) BY MOUTH DAILY. 90 tablet 3   Calcium Carbonate Antacid (CALCIUM CARBONATE PO) Take 1 tablet by mouth 2 (two) times daily.     digoxin (LANOXIN) 0.125 MG tablet Take 0.5 tablets (0.0625 mg total) by mouth daily. 45 tablet 3   famotidine (PEPCID) 20 MG tablet Take 20 mg by mouth at bedtime.     ferrous sulfate 325 (65 FE) MG tablet Take 1 tablet (325 mg total) by mouth daily.     Hypromellose (ARTIFICIAL TEARS OP) Place 1 drop into both eyes every 6 (six) hours as needed (dry  eyes).     levothyroxine (SYNTHROID, LEVOTHROID) 75 MCG tablet Take 37.5-75 mcg by mouth See admin instructions. Take 75 mg by mouth for 6 days a week and 37.5 mg every Saturday in the morning before breakfast     nitroGLYCERIN (NITROSTAT) 0.4 MG SL tablet Place 1 tablet (0.4 mg total) under the tongue every 5 (five) minutes as needed for chest pain. 25 tablet 0   Omega-3 Fatty Acids (FISH OIL) 1200 MG CAPS Take 1,200 mg by mouth 2 (two) times daily.     senna (SENNA LAX) 8.6 MG tablet Take 1 tablet (8.6 mg total) by mouth daily.     sildenafil (REVATIO) 20 MG tablet Take 0.5 tablets (10 mg total) by mouth in the morning and at bedtime. 30 tablet 3   Tiotropium Bromide-Olodaterol (STIOLTO RESPIMAT) 2.5-2.5 MCG/ACT AERS Inhale 2.5 mcg into the lungs daily at 6 (six) AM. INHALE 2 PUFFS BY MOUTH INTO THE LUNGS DAILY 12 g 3   torsemide (DEMADEX) 20 MG tablet Take 1 tablet (20 mg total) by mouth 2 (two)  times daily. 60 tablet 5   vitamin B-12 (CYANOCOBALAMIN) 1000 MCG tablet Take 1,000 mcg by mouth daily.     zolpidem (AMBIEN) 5 MG tablet Take 5 mg by mouth at bedtime.     No facility-administered medications prior to visit.     Review of Systems:   Constitutional: No weight loss or gain, night sweats, fevers, chills, fatigue, or lassitude. HEENT: No headaches, difficulty swallowing, tooth/dental problems, or sore throat. No sneezing, itching, ear ache, nasal congestion, or post nasal drip CV:  + swelling in lower extremities. No chest pain, orthopnea, PND, anasarca, dizziness, palpitations, syncope Resp: +shortness of breath with exertion. No excess mucus or change in color of mucus. No productive or non-productive. No hemoptysis. No wheezing.  No chest wall deformity GI:  +blood in stool; abd swelling. No heartburn, indigestion, abdominal pain, nausea, vomiting, diarrhea, change in bowel habits, loss of appetite  GU: No dysuria, change in color of urine, urgency or frequency.   Skin: No rash,  lesions, ulcerations MSK:  No joint pain or swelling. Neuro: No dizziness or lightheadedness.  Psych: No depression or anxiety. Mood stable.     Physical Exam:  BP (!) 113/59 (BP Location: Left Arm, Patient Position: Sitting, Cuff Size: Normal)   Pulse 62   Ht 5\' 2"  (1.575 m)   Wt 108 lb (49 kg)   SpO2 97%   BMI 19.75 kg/m   GEN: Pleasant, interactive, well-kempt; elderly; in no acute distress HEENT:  Normocephalic and atraumatic. PERRLA. Sclera white. Nasal turbinates pink, moist and patent bilaterally. No rhinorrhea present. Oropharynx pink and moist, without exudate or edema. No lesions, ulcerations, or postnasal drip.  NECK:  Supple w/ fair ROM. No JVD present. Normal carotid impulses w/o bruits. Thyroid symmetrical with no goiter or nodules palpated. No lymphadenopathy.   CV: Irregular rhythm, rate controlled, systolic murmur, +1 BLE edema. Pulses intact, +2 bilaterally. No cyanosis, pallor or clubbing. PULMONARY:  Unlabored, regular breathing. Diminished bibasilar airflow otherwise clear bilaterally A&P. No accessory muscle use.  GI: BS present and normoactive. Soft, non-tender to palpation. No organomegaly or masses detected.  MSK: No erythema, warmth or tenderness. Cap refil <2 sec all extrem. No deformities or joint swelling noted.  Neuro: A/Ox3. No focal deficits noted.   Skin: Warm, no lesions or rashe Psych: Normal affect and behavior. Judgement and thought content appropriate.     Lab Results:  CBC    Component Value Date/Time   WBC 7.1 12/04/2023 0542   RBC 3.32 (L) 12/04/2023 0542   HGB 9.9 (L) 12/04/2023 0542   HGB 10.4 (L) 02/15/2023 1514   HCT 30.8 (L) 12/04/2023 0542   HCT 32.8 (L) 02/15/2023 1514   PLT 115 (L) 12/04/2023 0542   PLT 92 (LL) 02/15/2023 1514   MCV 92.8 12/04/2023 0542   MCV 92 02/15/2023 1514   MCH 29.8 12/04/2023 0542   MCHC 32.1 12/04/2023 0542   RDW 15.2 12/04/2023 0542   RDW 13.3 02/15/2023 1514   LYMPHSABS 0.8 12/03/2023 1720    MONOABS 1.2 (H) 12/03/2023 1720   EOSABS 0.0 12/03/2023 1720   BASOSABS 0.0 12/03/2023 1720    BMET    Component Value Date/Time   NA 144 12/27/2023 1142   K 3.8 12/27/2023 1142   CL 104 12/27/2023 1142   CO2 25 12/27/2023 1142   GLUCOSE 84 12/27/2023 1142   GLUCOSE 97 12/13/2023 1549   BUN 17 12/27/2023 1142   CREATININE 0.95 12/27/2023 1142   CREATININE 0.83 08/20/2016 1056  CALCIUM 9.6 12/27/2023 1142   GFRNONAA >60 12/13/2023 1549   GFRAA 75 04/04/2018 1032    BNP    Component Value Date/Time   BNP 264.8 (H) 12/27/2023 1142   BNP 249.0 (H) 12/13/2023 1549   BNP 194.8 (H) 08/20/2016 1056     Imaging:  No results found.  Administration History     None          Latest Ref Rng & Units 12/17/2015    9:35 AM  PFT Results  FVC-Pre L 2.65   FVC-Predicted Pre % 128   FVC-Post L 2.48   FVC-Predicted Post % 120   Pre FEV1/FVC % % 37   Post FEV1/FCV % % 44   FEV1-Pre L 0.99   FEV1-Predicted Pre % 65   FEV1-Post L 1.09   DLCO uncorrected ml/min/mmHg 12.88   DLCO UNC% % 68   DLCO corrected ml/min/mmHg 14.04   DLCO COR %Predicted % 74   DLVA Predicted % 75   TLC L 4.62   TLC % Predicted % 103   RV % Predicted % 87     No results found for: "NITRICOXIDE"      Assessment & Plan:   Pleural effusion Possibly superimposed infectious process on top of volume overload. She did have a small left effusion as well with associated weight gain/BLE edema. Exudative effusion on thoracentesis with cytology negative for malignancy. Symptoms today concerning for re-accumulation. CXR with possible mild increase in right effusion; left looks clear. She does also have signs of volume overload with BLE edema, weight gain of 5 lb. Missed doses of diuretic. Will have her increase her lasix for the next 3-5 days then resume Twice daily dosing. Advised of the importance of compliance with therapy. Verbalized understanding. Will recheck BMET and BNP today. CXR next week to  ensure improvement. If effusion continues to enlarge, she will need another thoracentesis with repeat fluid studies.   Patient Instructions  -Increase torsemide to 1 tab three times a day for the next 3 days then return to 1 tab Twice daily if you are back to your dry weight of 103 lb. If you're still above this and having some swelling, continue three times a day for a total of 5 days then return to Twice daily. Monitor your blood pressure. Stop and notify if you develop lightheadedness or dizziness -Take potassium 20 meq daily while taking the increased dose of torsemide. Once you're back on it twice a day, you can discuss continuing with your heart doctor  -Start colace (docusate) over the counter as needed for hard stools/constipation   Monitor your weights and notify your heart doctor of 2-3 lb weight gain overnight or 5 lb in a week   Labs today   Notify your primary care doctor about the blood in your stool   Follow up in one week with Dr. Isaiah Serge or Florentina Addison Eladia Frame,NP. Ok to [facility] on day not already double booked. If symptoms do not improve or worsen, please contact office for sooner follow up or seek emergency care.    COPD (chronic obstructive pulmonary disease) (HCC) Moderate obstruction. No adventitious breath sounds on exam. DOE seems to be related to volume overload. See above. Continue LABA/LAMA therapy with Stiolto. Action plan in place.   Pulmonary hypertension, unspecified (HCC) Group 2/3. See above. Follow up with cardiology as scheduled. Monitor weights/swelling at home   Chronic atrial fibrillation (HCC) Regular rate. On chronic AC.   Blood in stool Reported by patient.  Seems to be related to constipation/possible hemorrhoids; however, she is on chronic AC and higher risk for GI bleed. Recheck CBC today to ensure this is stable. Advised her to start stool softener with docusate and recommended she contact PCP to consider further workup. ED precautions reviewed.    Advised  if symptoms do not improve or worsen, to please contact office for sooner follow up or seek emergency care.   I spent 45 minutes of dedicated to the care of this patient on the date of this encounter to include pre-visit review of records, face-to-face time with the patient discussing conditions above, post visit ordering of testing, clinical documentation with the electronic health record, making appropriate referrals as documented, and communicating necessary findings to members of the patients care team.  Noemi Chapel, NP 01/04/2024  Pt aware and understands NP's role.

## 2024-01-04 NOTE — Addendum Note (Signed)
 Addended by: Lanna Poche on: 01/04/2024 04:38 PM   Modules accepted: Orders

## 2024-01-04 NOTE — Assessment & Plan Note (Signed)
 Regular rate. On chronic AC.

## 2024-01-04 NOTE — Assessment & Plan Note (Signed)
 Moderate obstruction. No adventitious breath sounds on exam. DOE seems to be related to volume overload. See above. Continue LABA/LAMA therapy with Stiolto. Action plan in place.

## 2024-01-04 NOTE — Patient Instructions (Addendum)
-  Increase torsemide to 1 tab three times a day for the next 3 days then return to 1 tab Twice daily if you are back to your dry weight of 103 lb. If you're still above this and having some swelling, continue three times a day for a total of 5 days then return to Twice daily. Monitor your blood pressure. Stop and notify if you develop lightheadedness or dizziness -Take potassium 20 meq daily while taking the increased dose of torsemide. Once you're back on it twice a day, you can discuss continuing with your heart doctor  -Start colace (docusate) over the counter as needed for hard stools/constipation  -Continue Stiolto 2 puffs daily   Monitor your weights and notify your heart doctor of 2-3 lb weight gain overnight or 5 lb in a week   Labs today   Notify your primary care doctor about the blood in your stool   Follow up in one week with Dr. Isaiah Serge or Florentina Addison Emrie Gayle,NP. Ok to DB [facility] on day not already double booked. If symptoms do not improve or worsen, please contact office for sooner follow up or seek emergency care.

## 2024-01-04 NOTE — Assessment & Plan Note (Signed)
 Possibly superimposed infectious process on top of volume overload. She did have a small left effusion as well with associated weight gain/BLE edema. Exudative effusion on thoracentesis with cytology negative for malignancy. Symptoms today concerning for re-accumulation. CXR with possible mild increase in right effusion; left looks clear. She does also have signs of volume overload with BLE edema, weight gain of 5 lb. Missed doses of diuretic. Will have her increase her lasix for the next 3-5 days then resume Twice daily dosing. Advised of the importance of compliance with therapy. Verbalized understanding. Will recheck BMET and BNP today. CXR next week to ensure improvement. If effusion continues to enlarge, she will need another thoracentesis with repeat fluid studies.   Patient Instructions  -Increase torsemide to 1 tab three times a day for the next 3 days then return to 1 tab Twice daily if you are back to your dry weight of 103 lb. If you're still above this and having some swelling, continue three times a day for a total of 5 days then return to Twice daily. Monitor your blood pressure. Stop and notify if you develop lightheadedness or dizziness -Take potassium 20 meq daily while taking the increased dose of torsemide. Once you're back on it twice a day, you can discuss continuing with your heart doctor  -Start colace (docusate) over the counter as needed for hard stools/constipation   Monitor your weights and notify your heart doctor of 2-3 lb weight gain overnight or 5 lb in a week   Labs today   Notify your primary care doctor about the blood in your stool   Follow up in one week with Dr. Isaiah Serge or Florentina Addison Evalyne Cortopassi,NP. Ok to DB [facility] on day not already double booked. If symptoms do not improve or worsen, please contact office for sooner follow up or seek emergency care.

## 2024-01-05 ENCOUNTER — Other Ambulatory Visit (HOSPITAL_COMMUNITY): Payer: Self-pay

## 2024-01-05 ENCOUNTER — Telehealth: Payer: Self-pay

## 2024-01-05 ENCOUNTER — Other Ambulatory Visit: Payer: Self-pay

## 2024-01-05 ENCOUNTER — Telehealth: Payer: Self-pay | Admitting: Cardiology

## 2024-01-05 LAB — CBC WITH DIFFERENTIAL/PLATELET
Basophils Absolute: 0.1 10*3/uL (ref 0.0–0.1)
Basophils Relative: 0.9 % (ref 0.0–3.0)
Eosinophils Absolute: 0.1 10*3/uL (ref 0.0–0.7)
Eosinophils Relative: 2.1 % (ref 0.0–5.0)
HCT: 30.9 % — ABNORMAL LOW (ref 36.0–46.0)
Hemoglobin: 10 g/dL — ABNORMAL LOW (ref 12.0–15.0)
Lymphocytes Relative: 17.3 % (ref 12.0–46.0)
Lymphs Abs: 1 10*3/uL (ref 0.7–4.0)
MCHC: 32.5 g/dL (ref 30.0–36.0)
MCV: 91.3 fl (ref 78.0–100.0)
Monocytes Absolute: 0.7 10*3/uL (ref 0.1–1.0)
Monocytes Relative: 11.6 % (ref 3.0–12.0)
Neutro Abs: 4 10*3/uL (ref 1.4–7.7)
Neutrophils Relative %: 68.1 % (ref 43.0–77.0)
Platelets: 127 10*3/uL — ABNORMAL LOW (ref 150.0–400.0)
RBC: 3.38 Mil/uL — ABNORMAL LOW (ref 3.87–5.11)
RDW: 16.6 % — ABNORMAL HIGH (ref 11.5–15.5)
WBC: 5.9 10*3/uL (ref 4.0–10.5)

## 2024-01-05 MED ORDER — TORSEMIDE 20 MG PO TABS: 20.0000 mg | ORAL_TABLET | Freq: Three times a day (TID) | ORAL | 0 refills | Status: DC

## 2024-01-05 NOTE — Telephone Encounter (Signed)
 Advanced Heart Failure Patient Advocate Encounter  Prior authorization for Sildenafil has been submitted and approved. Test billing returns $9.12 for 30 day supply.  Key: [id number] Effective: 11/02/2023 to 10/31/2024  Burnell Blanks, CPhT Rx Patient Advocate Phone: [phone]

## 2024-01-06 ENCOUNTER — Encounter: Payer: Self-pay | Admitting: Nurse Practitioner

## 2024-01-11 DIAGNOSIS — L578 Other skin changes due to chronic exposure to nonionizing radiation: Secondary | ICD-10-CM | POA: Diagnosis not present

## 2024-01-11 DIAGNOSIS — D225 Melanocytic nevi of trunk: Secondary | ICD-10-CM | POA: Diagnosis not present

## 2024-01-11 DIAGNOSIS — L821 Other seborrheic keratosis: Secondary | ICD-10-CM | POA: Diagnosis not present

## 2024-01-11 DIAGNOSIS — L814 Other melanin hyperpigmentation: Secondary | ICD-10-CM | POA: Diagnosis not present

## 2024-01-11 DIAGNOSIS — L570 Actinic keratosis: Secondary | ICD-10-CM | POA: Diagnosis not present

## 2024-01-17 IMAGING — DX DG CHEST 2V
2 series · 2 of 2 positions shown · non-contrast
Comparison: 02/25/2021

CLINICAL DATA: 89-year-old female with a history of COPD/cough

EXAM:
CHEST - 2 VIEW

[chest pa]
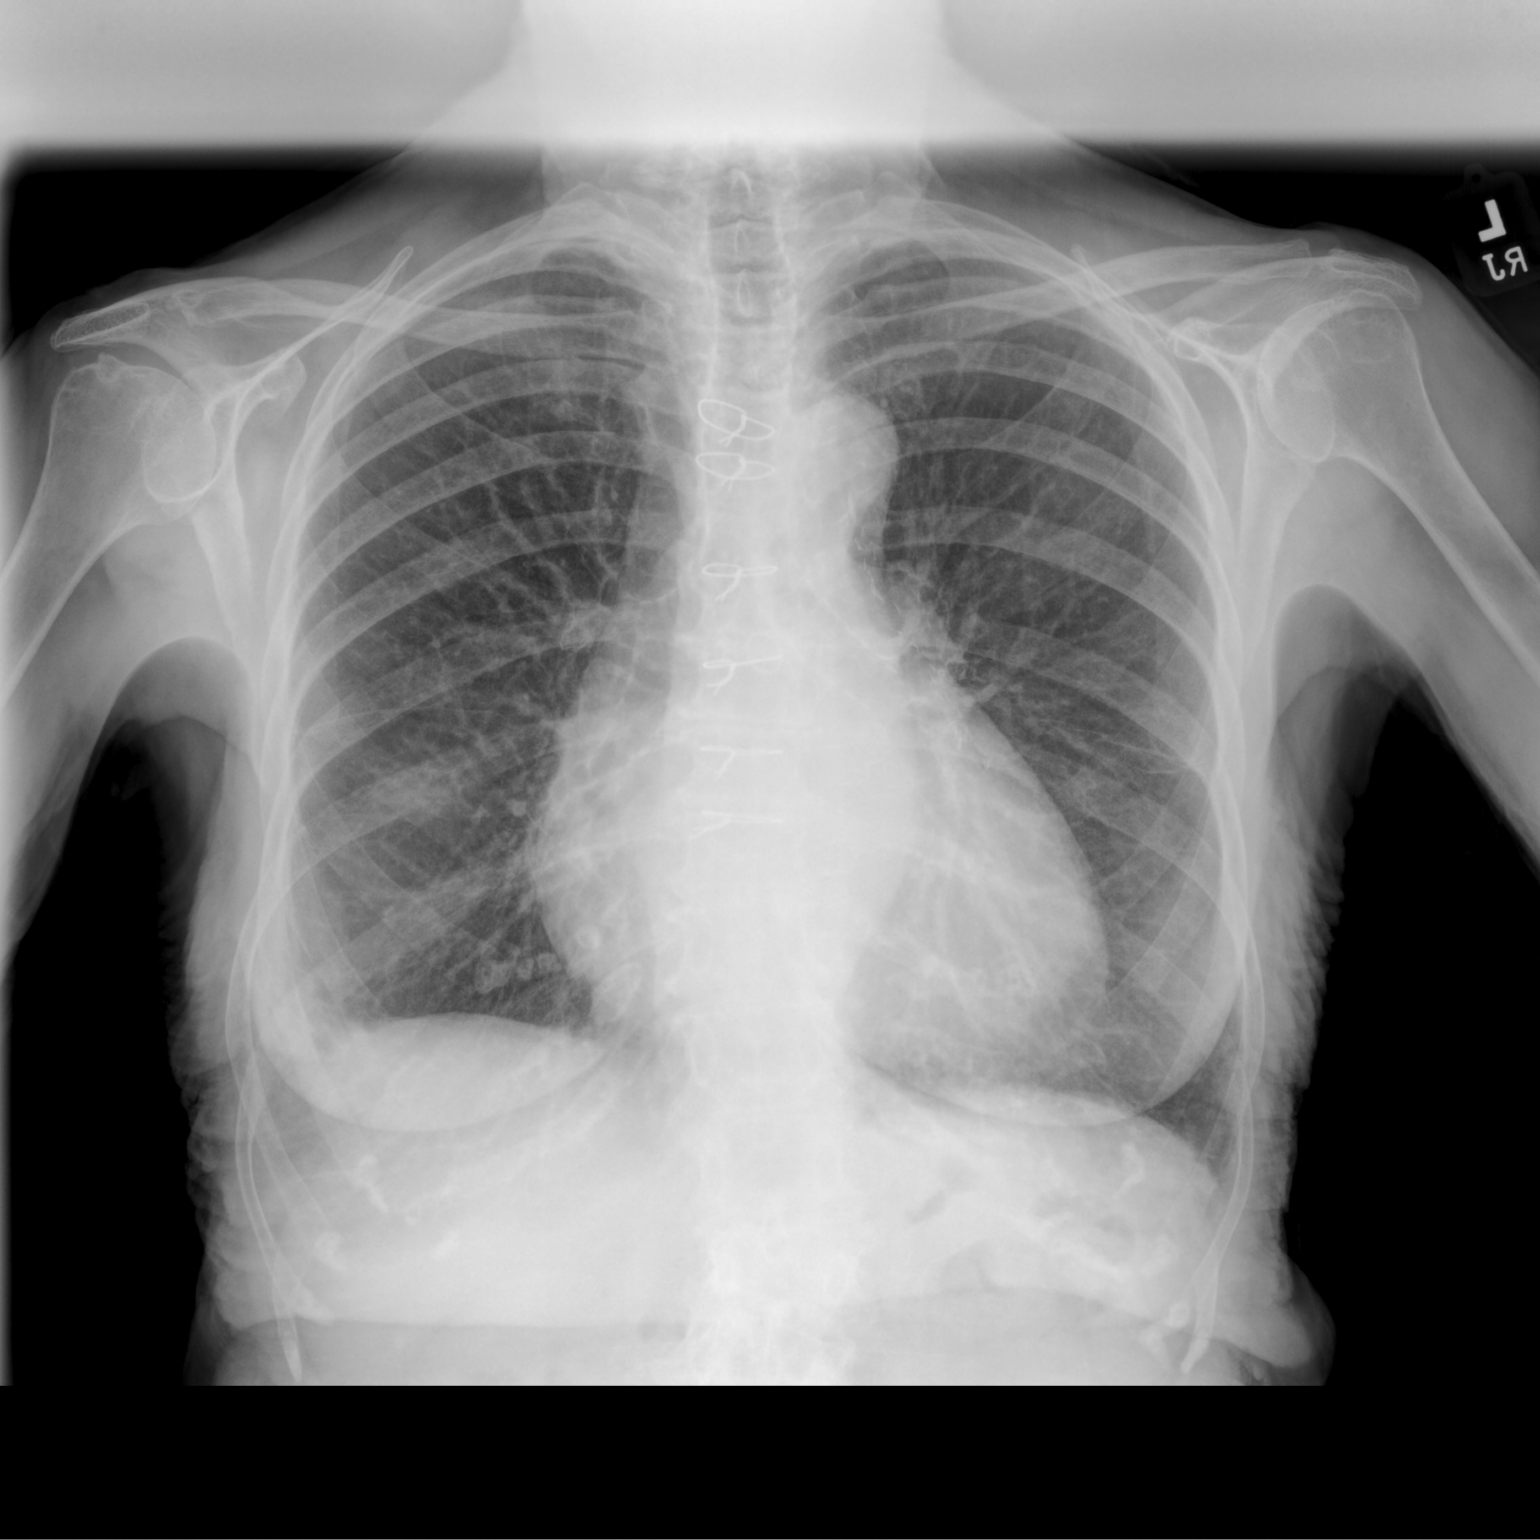

[chest lat]
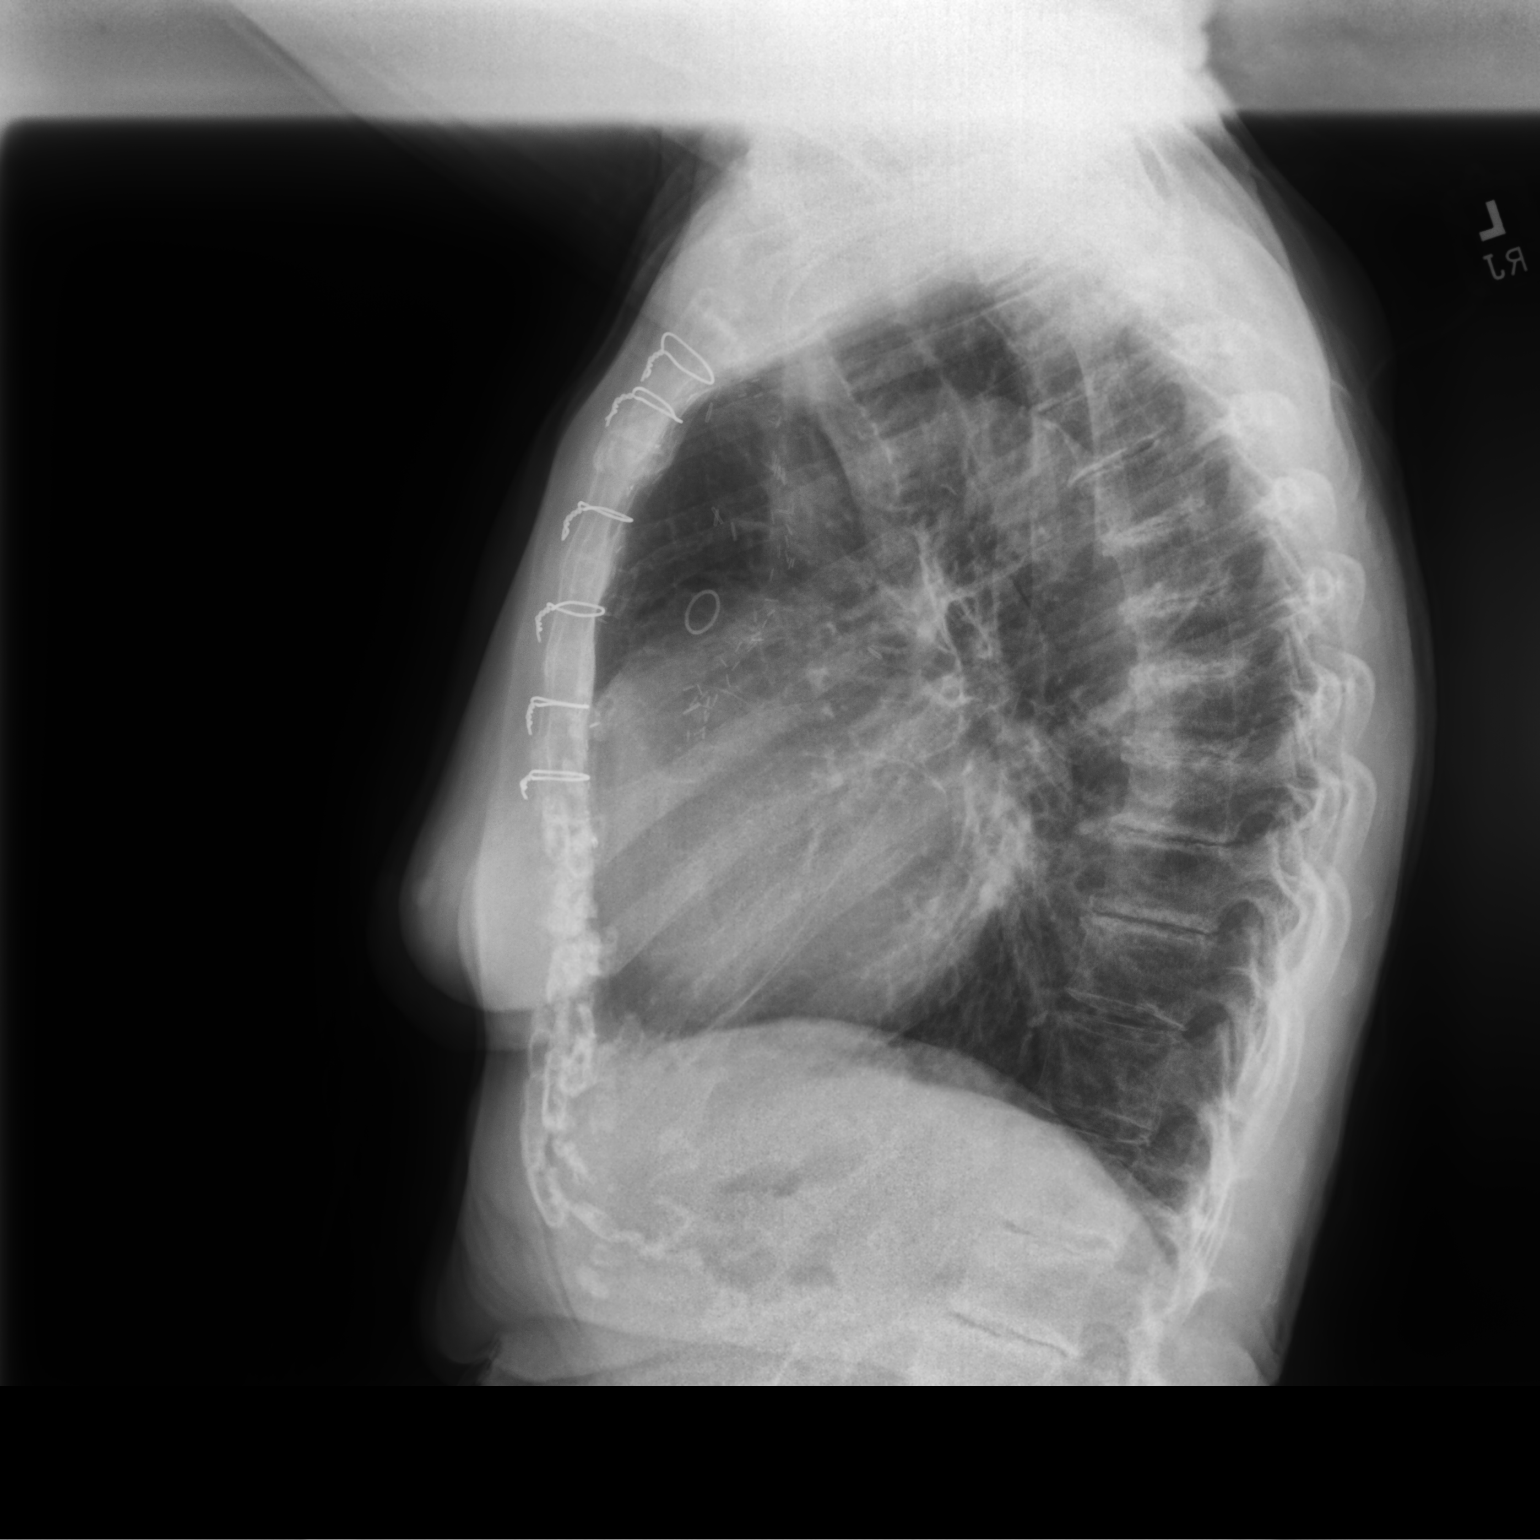

[2 of 2 positions shown; findings below may reference images not displayed]

FINDINGS: Cardiomediastinal silhouette unchanged in size and contour. Surgical
changes of median sternotomy and CABG. No evidence of central
vascular congestion. No interlobular septal thickening.

New reticulonodular opacities in the right lung with increased
pleuroparenchymal thickening at the right lung base.

No pneumothorax or pleural effusion. Coarsened interstitial
markings.

Stigmata of emphysema, with increased retrosternal airspace,
flattened hemidiaphragms, increased AP diameter, and hyperinflation
on the AP view.

No acute displaced fracture. Degenerative changes of the spine.
IMPRESSION: Reticulonodular opacities at the right lung base favored to
represent new infection and possible trace pleural effusion versus
progression of scarring. Followup PA and lateral chest X-ray is
recommended in 3-4 weeks following therapy to assure resolution.

## 2024-01-18 ENCOUNTER — Encounter: Payer: Self-pay | Admitting: Nurse Practitioner

## 2024-01-18 ENCOUNTER — Ambulatory Visit: Admitting: Nurse Practitioner

## 2024-01-18 ENCOUNTER — Ambulatory Visit

## 2024-01-18 VITALS — BP 108/58 | HR 91 | Temp 97.9°F | Ht 62.0 in | Wt 108.8 lb

## 2024-01-18 DIAGNOSIS — I272 Pulmonary hypertension, unspecified: Secondary | ICD-10-CM

## 2024-01-18 DIAGNOSIS — J449 Chronic obstructive pulmonary disease, unspecified: Secondary | ICD-10-CM

## 2024-01-18 DIAGNOSIS — J90 Pleural effusion, not elsewhere classified: Secondary | ICD-10-CM

## 2024-01-18 DIAGNOSIS — R918 Other nonspecific abnormal finding of lung field: Secondary | ICD-10-CM | POA: Diagnosis not present

## 2024-01-18 DIAGNOSIS — E876 Hypokalemia: Secondary | ICD-10-CM

## 2024-01-18 DIAGNOSIS — I482 Chronic atrial fibrillation, unspecified: Secondary | ICD-10-CM | POA: Diagnosis not present

## 2024-01-18 LAB — BASIC METABOLIC PANEL
BUN: 21 mg/dL (ref 6–23)
CO2: 27 meq/L (ref 19–32)
Calcium: 9.7 mg/dL (ref 8.4–10.5)
Chloride: 103 meq/L (ref 96–112)
Creatinine, Ser: 1.14 mg/dL (ref 0.40–1.20)
GFR: 42.59 mL/min — ABNORMAL LOW (ref >60.00)
Glucose, Bld: 87 mg/dL (ref 70–99)
Potassium: 4.1 meq/L (ref 3.5–5.1)
Sodium: 139 meq/L (ref 135–145)

## 2024-01-18 NOTE — Progress Notes (Signed)
 @Patient  ID: [name], female    DOB: [birth year], 89 y.o.   MRN: [id number]  Chief Complaint  Patient presents with   Follow-up    Sob about the same.  Has had some random pain in her chest after she has eaten while she is sitting inactive.  No nausea, radiation.    Referring provider: Charlane Ferretti, DO  HPI: 89 year old female, never smoker followed for moderate COPD and PH. She is a patient of [name] and last seen in office 01/04/2023 by [name]. Past medical history significant for RV failure, a fib on Eliquis, HTN, severe tricuspid regurgitation, hypothyroid, HLD, IDA.   TEST/EVENTS:  12/17/2015 PFT: FVC 125, FEV1 65, ratio 44, TLC 103, DLCOcor 74.  10/12/2023 echo: EF 60-65%. Mild LVH. Dilated RV and RA. Mildly reduced RV function, severely enlarged. Moderately elevated PASP. LA mildly dilated. RA severely dilated. Moderate MR. TR severe.  12/03/2023 CTA chest: cardiac enlargement with particular enlargement of right heart. S/p CABG. Esophagus decompressed. Moderate right and small left pleural effusions. B/l basilar atelectasis with patchy infiltrates in right lung, atelectasis vs pna.  12/04/2023 CXR: post thoracentesis. Small right effusion, decreased.  01/04/2024 CXR: stable small right effusion with atelectasis   08/11/2023: Ov with [name]. Reports SOB with minimal exertion, such as walking her dog. No issues with bowling. Currently managed for PH with sildenafil, which caused some dizziness so now she's taking a half dose. Follows with cardiology. Uses Stiolto for COPD. She does have leg swelling, which she takes lasix for and wearing compression stockings. Never smoker. Has a diagnosis of COPD on record but unsure if PFT is accurate as there is an abnormal, disproportionately blunted expiratory flow loop suggestive of poor effort. Can repeat PFT but pt declined. Continue Stiolto. B/l effusions with partial loculation on the right; does not appear infected. Suspect this may  be from CHF. Confinue lasix. Alpha 1 MS phenotype, normal levels.   01/04/2024: OV with [name] for follow up with her son. She was seen in the ED 2/1 for 1 week worsening cough, dyspnea, orthopnea. She had tried increasing lasix at home but no improvement so she went to UC where CXR showed a large right effusion. She was sent to the ED for further evaluation/management. She had thoracentesis with bloody fluid. Concern for parapneumonic vs inflammatory effusion given appearance. She was treated with augmentin for 7 days and advised to increase lasix for 2 days. Cytology was negative for malignancy. She was discharged home. Since then, she's seen cardiology who changed her to torsemide twice daily.  She tells me today that she was feeling better but over the past few days, she's had some Walker shortness of breath with activity. She also feels like her abdomen is a little Walker bloated. Her legs are a little swollen but no where nearly how they were in January. Her weight is up about 5 lb from her dry weight of 5 lb. She admits she hasn't been taking her diuretic daily. She denies any cough, wheezing, PND, chest pain, palpitations, hemoptysis, fevers, chills.  She does also tell me she's been having some blood in her stool since she started the lasix. She says it's a small amount and someone told her it was probably hemorrhoids. She does occasionally have some soreness when she wipes. She does admit she doesn't drink much water. She's not taking anything over the counter. She is on Eliquis. No lightheadedness/dizziness, abdominal pain, weight loss, anorexia.  01/18/2024: Today - follow up Patient presents today for follow up with her son.  She is feeling relatively well.  Feels like she is mostly back to her normal.  Does still have some shortness of breath with Walker strenuous activities.  Her swelling was doing better and her weight was also down to 103 pounds, which is her dry weight, after taking torsemide 3  times a day.  She has since returned to twice daily dosing and her weight has slowly crept back up again.  She also feels like she has the bloating in her abdomen which she has had in the past when she feels Walker swollen.  Legs look okay for right now.  She is wearing compression stockings.  No orthopnea, PND, palpitations, chest pain, cough, wheezing, hemoptysis, fevers, chills. Using her Stiolto daily.  Does not ever have to use her rescue. Pleural effusion at her last office visit it was stable. She has had some occasional cramping in her legs, which is not necessarily new for her.  She did take the potassium that I had prescribed at her last visit while she was on the increased dose of torsemide but no longer taking any.  She did like the powder.  Has had difficulties with the pills in the past.  She has an upcoming appointment with cardiology on 3/28. Hasn't noticed any blood in her stool recently. Last hemoglobin was normal. Compliant with AC. No excessive bruising. She is trying to increase her water intake over other liquids to help with constipation   Allergies  Allergen Reactions   Sulfa Antibiotics Rash    Immunization History  Administered Date(s) Administered   Fluad Quad(high Dose 65+) 07/05/2019, 08/03/2022, 08/04/2023   Influenza Split 07/16/2015   Influenza, High Dose Seasonal PF 07/24/2014, 07/21/2016, 08/03/2017, 07/03/2018   Influenza, Quadrivalent, Recombinant, Inj, Pf 07/13/2020, 07/13/2021   PFIZER(Purple Top)SARS-COV-2 Vaccination 11/27/2019, 12/18/2019, 09/02/2020   Pneumococcal Conjugate-13 08/01/2017   Tdap 07/24/2022   Zoster Recombinant(Shingrix) 04/19/2018, 06/21/2018    Past Medical History:  Diagnosis Date   Chronic atrial fibrillation (HCC)    a. Pt declined cardioversion.   Coronary artery disease    a. s/p CABGx2 06/2006 (LIMA-LAD, SVG-LCx at bifurcation of OM II/III). b. Nuc 11/2012: no evidence of ischemia, attenuation artifact c/w breast attenuation in  anterior region, essentially low risk, EF 88%.   Essential hypertension    Hyperlipidemia    Hypothyroidism    Insomnia    on ambien, failed other meds   Iron deficiency anemia    Osteoarthritis    Osteopenia    involved on BMD 06/01/07 (T score-1.6); slightly worse in 3/13 (T score -1.6; FRAX 3%  and 12%)-plan rechcekin 2016   Uterine prolapse    post hysterectomy, Dr. Vincente Poli   UTI (lower urinary tract infection) 3/11    Tobacco History: Social History   Tobacco Use  Smoking Status Never   Passive exposure: Past  Smokeless Tobacco Never   Counseling given: Not Answered   Outpatient Medications Prior to Visit  Medication Sig Dispense Refill   acetaminophen (TYLENOL) 500 MG tablet Take 500 mg by mouth daily. May take 1 tablet in the afternoon     alendronate (FOSAMAX) 70 MG tablet Take 70 mg by mouth once a week.      apixaban (ELIQUIS) 2.5 MG TABS tablet Take 1 tablet (2.5 mg total) by mouth 2 (two) times daily. 180 tablet 3   atorvastatin (LIPITOR) 20 MG tablet TAKE 1 TABLET BY MOUTH EVERY DAY  90 tablet 2   bisoprolol (ZEBETA) 5 MG tablet TAKE 1 TABLET (5 MG TOTAL) BY MOUTH DAILY. 90 tablet 3   Calcium Carbonate Antacid (CALCIUM CARBONATE PO) Take 1 tablet by mouth 2 (two) times daily.     cholecalciferol (VITAMIN D3) 25 MCG (1000 UNIT) tablet Take 1,000 Units by mouth daily.     digoxin (LANOXIN) 0.125 MG tablet Take 0.5 tablets (0.0625 mg total) by mouth daily. 45 tablet 3   docusate sodium (COLACE) 100 MG capsule Take 100 mg by mouth 2 (two) times daily.     famotidine (PEPCID) 20 MG tablet Take 20 mg by mouth at bedtime.     ferrous sulfate 325 (65 FE) MG tablet Take 1 tablet (325 mg total) by mouth daily.     FLUZONE HIGH-DOSE 0.5 ML injection Inject 0.5 mLs into the muscle once.     Hypromellose (ARTIFICIAL TEARS OP) Place 1 drop into both eyes every 6 (six) hours as needed (dry eyes).     levothyroxine (SYNTHROID, LEVOTHROID) 75 MCG tablet Take 37.5-75 mcg by mouth See  admin instructions. Take 75 mg by mouth for 6 days a week and 37.5 mg every Saturday in the morning before breakfast     nitroGLYCERIN (NITROSTAT) 0.4 MG SL tablet Place 1 tablet (0.4 mg total) under the tongue every 5 (five) minutes as needed for chest pain. 25 tablet 0   Omega-3 Fatty Acids (FISH OIL) 1200 MG CAPS Take 1,200 mg by mouth 2 (two) times daily.     potassium chloride (KLOR-CON) 20 MEQ packet Take 20 mEq by mouth daily. Take while on torsemide three times a day 5 each 0   senna (SENNA LAX) 8.6 MG tablet Take 1 tablet (8.6 mg total) by mouth daily.     sildenafil (REVATIO) 20 MG tablet Take 0.5 tablets (10 mg total) by mouth in the morning and at bedtime. 30 tablet 3   Tiotropium Bromide-Olodaterol (STIOLTO RESPIMAT) 2.5-2.5 MCG/ACT AERS Inhale 2.5 mcg into the lungs daily at 6 (six) AM. INHALE 2 PUFFS BY MOUTH INTO THE LUNGS DAILY 12 g 3   torsemide (DEMADEX) 20 MG tablet Take 1 tablet (20 mg total) by mouth 2 (two) times daily. 60 tablet 5   vitamin B-12 (CYANOCOBALAMIN) 1000 MCG tablet Take 1,000 mcg by mouth daily.     zolpidem (AMBIEN) 5 MG tablet Take 5 mg by mouth at bedtime.     torsemide (DEMADEX) 20 MG tablet Take 1 tablet (20 mg total) by mouth 3 (three) times daily for 3 days. 9 tablet 0   No facility-administered medications prior to visit.     Review of Systems:   Constitutional: No night sweats, fevers, chills, fatigue, or lassitude. +weight gain HEENT: No headaches, difficulty swallowing, tooth/dental problems, or sore throat. No sneezing, itching, ear ache, nasal congestion, or post nasal drip CV:  + swelling in lower extremities (improved). No chest pain, orthopnea, PND, anasarca, dizziness, palpitations, syncope Resp: +shortness of breath with exertion (improved). No excess mucus or change in color of mucus. No productive or non-productive. No hemoptysis. No wheezing.  No chest wall deformity GI:  +abd swelling. No heartburn, indigestion, abdominal pain, nausea,  vomiting, diarrhea, change in bowel habits, loss of appetite, blood in stool GU: No dysuria, change in color of urine, urgency or frequency.   Skin: No rash, lesions, ulcerations MSK:  No joint pain or swelling. Neuro: No dizziness or lightheadedness.  Psych: No depression or anxiety. Mood stable.     Physical  Exam:  BP (!) 108/58 (BP Location: Right Arm, Patient Position: Sitting, Cuff Size: Normal)   Pulse 91   Temp 97.9 F (36.6 C) (Oral)   Ht 5\' 2"  (1.575 m)   Wt 108 lb 12.8 oz (49.4 kg)   SpO2 96%   BMI 19.90 kg/m   GEN: Pleasant, interactive, well-kempt; elderly; in no acute distress HEENT:  Normocephalic and atraumatic. PERRLA. Sclera white. Nasal turbinates pink, moist and patent bilaterally. No rhinorrhea present. Oropharynx pink and moist, without exudate or edema. No lesions, ulcerations, or postnasal drip.  NECK:  Supple w/ fair ROM. No JVD present. Normal carotid impulses w/o bruits. Thyroid symmetrical with no goiter or nodules palpated. No lymphadenopathy.   CV: Irregular rhythm, rate controlled, systolic murmur, no BLE edema. Pulses intact, +2 bilaterally. No cyanosis, pallor or clubbing. PULMONARY:  Unlabored, regular breathing. Diminished bibasilar airflow otherwise clear bilaterally A&P. No accessory muscle use.  GI: BS present and normoactive. Soft, non-tender to palpation. No organomegaly or masses detected.  MSK: No erythema, warmth or tenderness. Cap refil <2 sec all extrem. No deformities or joint swelling noted.  Neuro: A/Ox3. No focal deficits noted.   Skin: Warm, no lesions or rashe Psych: Normal affect and behavior. Judgement and thought content appropriate.     Lab Results:  CBC    Component Value Date/Time   WBC 5.9 01/04/2024 1638   RBC 3.38 (L) 01/04/2024 1638   HGB 10.0 (L) 01/04/2024 1638   HGB 10.4 (L) 02/15/2023 1514   HCT 30.9 (L) 01/04/2024 1638   HCT 32.8 (L) 02/15/2023 1514   PLT 127.0 (L) 01/04/2024 1638   PLT 92 (LL) 02/15/2023  1514   MCV 91.3 01/04/2024 1638   MCV 92 02/15/2023 1514   MCH 29.8 12/04/2023 0542   MCHC 32.5 01/04/2024 1638   RDW 16.6 (H) 01/04/2024 1638   RDW 13.3 02/15/2023 1514   LYMPHSABS 1.0 01/04/2024 1638   MONOABS 0.7 01/04/2024 1638   EOSABS 0.1 01/04/2024 1638   BASOSABS 0.1 01/04/2024 1638    BMET    Component Value Date/Time   NA 139 01/18/2024 1207   NA 144 12/27/2023 1142   K 4.1 01/18/2024 1207   CL 103 01/18/2024 1207   CO2 27 01/18/2024 1207   GLUCOSE 87 01/18/2024 1207   BUN 21 01/18/2024 1207   BUN 17 12/27/2023 1142   CREATININE 1.14 01/18/2024 1207   CREATININE 0.83 08/20/2016 1056   CALCIUM 9.7 01/18/2024 1207   GFRNONAA >60 12/13/2023 1549   GFRAA 75 04/04/2018 1032    BNP    Component Value Date/Time   BNP 264.8 (H) 12/27/2023 1142   BNP 249.0 (H) 12/13/2023 1549   BNP 194.8 (H) 08/20/2016 1056     Imaging:  DG Chest 2 View Result Date: 01/04/2024 CLINICAL DATA:  Pleural effusion. EXAM: CHEST - 2 VIEW COMPARISON:  December 04, 2023. FINDINGS: Stable cardiomediastinal silhouette. Sternotomy wires are noted. Small right pleural effusion is noted with adjacent subsegmental atelectasis. Minimal left basilar subsegmental atelectasis is noted. IMPRESSION: Small right pleural effusion is noted with adjacent subsegmental atelectasis. Electronically Signed   By: Lupita Raider M.D.   On: 01/04/2024 13:58    Administration History     None          Latest Ref Rng & Units 12/17/2015    9:35 AM  PFT Results  FVC-Pre L 2.65   FVC-Predicted Pre % 128   FVC-Post L 2.48   FVC-Predicted Post % 120  Pre FEV1/FVC % % 37   Post FEV1/FCV % % 44   FEV1-Pre L 0.99   FEV1-Predicted Pre % 65   FEV1-Post L 1.09   DLCO uncorrected ml/min/mmHg 12.88   DLCO UNC% % 68   DLCO corrected ml/min/mmHg 14.04   DLCO COR %Predicted % 74   DLVA Predicted % 75   TLC L 4.62   TLC % Predicted % 103   RV % Predicted % 87     No results found for:  "NITRICOXIDE"      Assessment & Plan:   Pleural effusion Presumed superimposed infectious process on top of volume overload 12/03/2023 and treated with empiric abx, diuretics and thoracentesis. Exudative effusion on thoracentesis with cytology negative for malignancy. Prior CXR 3/5 stable small effusion and resolved left effusion. She did have s/s of volume overload at prior visit with BLE edema, weight gain of 5 lb so we had increased her torsemide dosing. She had good response to this and was back to her dry weight of 103 lb but since returning to bid dosing, weight has slowly increased by 5 lb and having abd bloating again. Will have her increase to Three times a day dosing if kidney function stable. Resume potassium therapy; dose dependent on current potassium levels. She can determine further adjustments to therapy at her f/u with cardiology on 3/28. Advised of the importance of compliance with therapy. Will recheck BMET today. CXR today to ensure stability/improvement. If effusion increases, she will need another thoracentesis with repeat fluid studies.   Patient Instructions  -I will probably increase your torsemide back to 1 tab three times a day since you've had some weight gain and abdominal swelling since going back to twice daily dosing. I want to check your kidney function and electrolytes first. Monitor your blood pressure. Stop and notify if you develop lightheadedness or dizziness -we will decide on the potassium dosing once we get your labs back  -Continue Stiolto 2 puffs daily   Monitor your weights and notify your heart doctor of 2-3 lb weight gain overnight or 5 lb in a week    Labs and x ray today    Follow up in 6 weeks with [name] or Katie Blakeley Scheier,Walker. If symptoms do not improve or worsen, please contact office for sooner follow up or seek emergency care.    COPD (chronic obstructive pulmonary disease) (HCC) Moderate obstruction. No adventitious breath sounds on exam.  Stable DOE. See above. Continue LABA/LAMA therapy with Stiolto. Action plan in place.   Pulmonary hypertension, unspecified (HCC) Group 2/3. See above. Follow up with cardiology as scheduled. Monitor weights/swelling at home   Chronic atrial fibrillation (HCC) Rate controlled. On chronic AC. Follow up with cardiology as scheduled     Advised if symptoms do not improve or worsen, to please contact office for sooner follow up or seek emergency care.   I spent 35 minutes of dedicated to the care of this patient on the date of this encounter to include pre-visit review of records, face-to-face time with the patient discussing conditions above, post visit ordering of testing, clinical documentation with the electronic health record, making appropriate referrals as documented, and communicating necessary findings to members of the patients care team.  Noemi Chapel, Walker 01/18/2024  Pt aware and understands Walker's role.

## 2024-01-18 NOTE — Assessment & Plan Note (Signed)
 Group 2/3. See above. Follow up with cardiology as scheduled. Monitor weights/swelling at home

## 2024-01-18 NOTE — Assessment & Plan Note (Signed)
Rate controlled. On chronic AC. Follow up with cardiology as scheduled.

## 2024-01-18 NOTE — Assessment & Plan Note (Signed)
 Moderate obstruction. No adventitious breath sounds on exam. Stable DOE. See above. Continue LABA/LAMA therapy with Stiolto. Action plan in place.

## 2024-01-18 NOTE — Patient Instructions (Addendum)
-  I will probably increase your torsemide back to 1 tab three times a day since you've had some weight gain and abdominal swelling since going back to twice daily dosing. I want to check your kidney function and electrolytes first. Monitor your blood pressure. Stop and notify if you develop lightheadedness or dizziness -we will decide on the potassium dosing once we get your labs back  -Continue Stiolto 2 puffs daily   Monitor your weights and notify your heart doctor of 2-3 lb weight gain overnight or 5 lb in a week    Labs and x ray today    Follow up in 6 weeks with Dr. Isaiah Serge or Katie Atleigh Gruen,NP. If symptoms do not improve or worsen, please contact office for sooner follow up or seek emergency care.

## 2024-01-18 NOTE — Assessment & Plan Note (Signed)
 Presumed superimposed infectious process on top of volume overload 12/03/2023 and treated with empiric abx, diuretics and thoracentesis. Exudative effusion on thoracentesis with cytology negative for malignancy. Prior CXR 3/5 stable small effusion and resolved left effusion. She did have s/s of volume overload at prior visit with BLE edema, weight gain of 5 lb so we had increased her torsemide dosing. She had good response to this and was back to her dry weight of 103 lb but since returning to bid dosing, weight has slowly increased by 5 lb and having abd bloating again. Will have her increase to Three times a day dosing if kidney function stable. Resume potassium therapy; dose dependent on current potassium levels. She can determine further adjustments to therapy at her f/u with cardiology on 3/28. Advised of the importance of compliance with therapy. Will recheck BMET today. CXR today to ensure stability/improvement. If effusion increases, she will need another thoracentesis with repeat fluid studies.   Patient Instructions  -I will probably increase your torsemide back to 1 tab three times a day since you've had some weight gain and abdominal swelling since going back to twice daily dosing. I want to check your kidney function and electrolytes first. Monitor your blood pressure. Stop and notify if you develop lightheadedness or dizziness -we will decide on the potassium dosing once we get your labs back  -Continue Stiolto 2 puffs daily   Monitor your weights and notify your heart doctor of 2-3 lb weight gain overnight or 5 lb in a week    Labs and x ray today    Follow up in 6 weeks with Dr. Isaiah Serge or Katie Bettylee Feig,NP. If symptoms do not improve or worsen, please contact office for sooner follow up or seek emergency care.

## 2024-01-19 MED ORDER — POTASSIUM CHLORIDE 10 MEQ PO PACK: 10.0000 meq | PACK | Freq: Every day | ORAL | 5 refills | Status: DC

## 2024-01-19 NOTE — Addendum Note (Signed)
 Addended by: Noemi Chapel on: 01/19/2024 10:22 AM   Modules accepted: Orders

## 2024-01-19 NOTE — Progress Notes (Signed)
 Kidney function stable. K levels were normal. Advise her to resume torsemide three times a day and then resume potassium 10 meq daily. I will send updated rx for the potassium. Thanks!

## 2024-01-19 NOTE — Progress Notes (Signed)
 Spoke with pt and notified of results per [facility]. Pt verbalized understanding.

## 2024-01-22 ENCOUNTER — Other Ambulatory Visit: Payer: Self-pay | Admitting: Nurse Practitioner

## 2024-01-22 ENCOUNTER — Other Ambulatory Visit (HOSPITAL_COMMUNITY): Payer: Self-pay | Admitting: Cardiology

## 2024-01-22 DIAGNOSIS — I272 Pulmonary hypertension, unspecified: Secondary | ICD-10-CM

## 2024-01-22 DIAGNOSIS — I50812 Chronic right heart failure: Secondary | ICD-10-CM

## 2024-01-22 DIAGNOSIS — E876 Hypokalemia: Secondary | ICD-10-CM

## 2024-01-23 NOTE — Telephone Encounter (Signed)
 Pharmacy comment: Alternative Requested:TOO EXPENSIVE SAID CAN SWALLOW CAPSULES OR IF CANT CAN BREAK OPEN CAPS

## 2024-01-23 NOTE — Progress Notes (Signed)
 ADVANCED HEART FAILURE CLINIC NOTE  Referring Physician: Charlane Ferretti, DO  Primary Care: Marisa Ferretti, DO Primary Cardiologist: [name]  HPI: [name] is a 89 y.o. female with history of CABG in 2007 for left main disease by Dr. Dorris Fetch, persistent atrial fibrillation, hypertension, hyperlipidemia and history of hysterectomy in 2012 and recently diagnosed pulmonary hypertension and severe tricuspid regurgitation presenting today for follow up  Despite her age, [name] has remained very active.  She continues to go bowling and square dancing however now reports becoming increasingly limited due to exertional dyspnea.  Due to shortness of breath, patient had an echocardiogram that demonstrated severe tricuspid regurgitation with a severely dilated right atrium and severely elevated PA systolic pressures.  She had a right heart cath which further confirmed a PVR of 3-4 Wood units.    With additional of low dose GDMT, sildenafil & digoxin, she has continued to partake in a bowling league, drives, performs ADLs independently and lives a highly functional life.   - Overall she has been doing fairly well; however, has had some difficulty with lower extremity edema. Takes diuretics PRN; continues to partake in her local bowling league.   Current Outpatient Medications  Medication Sig Dispense Refill   potassium chloride (MICRO-K) 10 MEQ CR capsule TAKE 1 CAPSULE BY MOUTH EVERY DAY 30 capsule 1   torsemide (DEMADEX) 20 MG tablet Take 1 tablet (20 mg total) by mouth 2 (two) times daily. 60 tablet 5   acetaminophen (TYLENOL) 500 MG tablet Take 500 mg by mouth daily. May take 1 tablet in the afternoon     alendronate (FOSAMAX) 70 MG tablet Take 70 mg by mouth once a week.      apixaban (ELIQUIS) 2.5 MG TABS tablet Take 1 tablet (2.5 mg total) by mouth 2 (two) times daily. 180 tablet 3   atorvastatin (LIPITOR) 20 MG tablet TAKE 1 TABLET BY MOUTH EVERY DAY 90 tablet 2   bisoprolol  (ZEBETA) 5 MG tablet TAKE 1 TABLET (5 MG TOTAL) BY MOUTH DAILY. 90 tablet 3   Calcium Carbonate Antacid (CALCIUM CARBONATE PO) Take 1 tablet by mouth 2 (two) times daily.     cholecalciferol (VITAMIN D3) 25 MCG (1000 UNIT) tablet Take 1,000 Units by mouth daily.     digoxin (LANOXIN) 0.125 MG tablet Take 0.5 tablets (0.0625 mg total) by mouth daily. 45 tablet 3   docusate sodium (COLACE) 100 MG capsule Take 100 mg by mouth 2 (two) times daily.     famotidine (PEPCID) 20 MG tablet Take 20 mg by mouth at bedtime.     ferrous sulfate 325 (65 FE) MG tablet Take 1 tablet (325 mg total) by mouth daily.     FLUZONE HIGH-DOSE 0.5 ML injection Inject 0.5 mLs into the muscle once.     Hypromellose (ARTIFICIAL TEARS OP) Place 1 drop into both eyes every 6 (six) hours as needed (dry eyes).     levothyroxine (SYNTHROID, LEVOTHROID) 75 MCG tablet Take 37.5-75 mcg by mouth See admin instructions. Take 75 mg by mouth for 6 days a week and 37.5 mg every Saturday in the morning before breakfast     nitroGLYCERIN (NITROSTAT) 0.4 MG SL tablet Place 1 tablet (0.4 mg total) under the tongue every 5 (five) minutes as needed for chest pain. 25 tablet 0   Omega-3 Fatty Acids (FISH OIL) 1200 MG CAPS Take 1,200 mg by mouth 2 (two) times daily.     senna (SENNA LAX) 8.6 MG tablet Take  1 tablet (8.6 mg total) by mouth daily.     sildenafil (REVATIO) 20 MG tablet Take 0.5 tablets (10 mg total) by mouth in the morning and at bedtime. 30 tablet 3   Tiotropium Bromide-Olodaterol (STIOLTO RESPIMAT) 2.5-2.5 MCG/ACT AERS Inhale 2.5 mcg into the lungs daily at 6 (six) AM. INHALE 2 PUFFS BY MOUTH INTO THE LUNGS DAILY 12 g 3   torsemide (DEMADEX) 20 MG tablet Take 1 tablet (20 mg total) by mouth 3 (three) times daily for 3 days. 9 tablet 0   vitamin B-12 (CYANOCOBALAMIN) 1000 MCG tablet Take 1,000 mcg by mouth daily.     zolpidem (AMBIEN) 5 MG tablet Take 5 mg by mouth at bedtime.     No current facility-administered medications for  this visit.    Allergies  Allergen Reactions   Sulfa Antibiotics Rash      Social History   Socioeconomic History   Marital status: Widowed    Spouse name: Not on file   Number of children: Not on file   Years of education: Not on file   Highest education level: Not on file  Occupational History   Occupation: retired  Tobacco Use   Smoking status: Never    Passive exposure: Past   Smokeless tobacco: Never  Vaping Use   Vaping status: Never Used  Substance and Sexual Activity   Alcohol use: No    Alcohol/week: 0.0 standard drinks of alcohol   Drug use: No   Sexual activity: Not Currently  Other Topics Concern   Not on file  Social History Narrative   Not on file   Social Drivers of Health   Financial Resource Strain: Not on file  Food Insecurity: Not on file  Transportation Needs: Not on file  Physical Activity: Not on file  Stress: Not on file  Social Connections: Not on file  Intimate Partner Violence: Not on file      Family History  Problem Relation Age of Onset   Other Mother    Heart disease Mother    Syncope episode Mother    Ulcers Mother    Heart disease Father    Heart attack Father    CAD Father    Diabetes Father    Lymphoma Sister    Cancer Brother        throat   Brain cancer Brother    Throat cancer Brother    Diabetes Brother    Diabetes type II Son    Colon cancer Neg Hx    Esophageal cancer Neg Hx    Stomach cancer Neg Hx    Pancreatic cancer Neg Hx     PHYSICAL EXAM: Vitals:   12/13/23 1508  BP: (!) 118/44  Pulse: 66  SpO2: 98%   GENERAL: Well nourished, well developed, and in no apparent distress at rest.  HEENT: There is no scleral icterus.  The mucous membranes are pink and moist.   CHEST: There are no chest wall deformities. There is no chest wall tenderness. Respirations are unlabored.  Lungs- decreased at bases CARDIAC:  JVP: 14 cm          Normal rate with regular rhythm. systolic murmur.  Pulses are 2+ and  symmetrical in upper and lower extremities. +2 edema.  ABDOMEN: Soft, non-tender, non-distended. There are normal bowel sounds.  EXTREMITIES: Warm and well perfused.  NEUROLOGIC: Patient is oriented x3 with no obvious focal neurologic deficits.  PSYCH: Patients affect is appropriate SKIN: Warm and dry; no lesions or  wounds.    DATA REVIEW  ECG: 02/25/23: Atrial fibrillation  As per my personal interpretation  ECHO: 02/08/23 LVEF 55%, mildly reduced RV, severely dilated RA. severe TR, moderate MR,  As per my personal interpretation 10/12/23: LVEF 55%, mildly reduced RV function with moderate enlargement; severely dilated RA, severe TR, mild to mod MR, personally reviewed and interpreted.   CATH: 02/17/23:  Hemodynamic findings consistent with mild to moderate pulmonary hypertension.   Aortic saturation 98% noninvasively.  PA saturation 65%.  RA pressure 20/30, mean RA pressure 18 mmHg, RV pressure 58/3, PA pressure 57/19, mean PA pressure 34 mmHg, pulmonary capillary wedge pressure 18/29, mean pulmonary capillary wedge pressure 19.  Cardiac output 4.3 L/min, cardiac index 2.85, calculated PVR 3.48.   prominent V waves noted on the wedge tracing.   RA waveform suggestive of significant tricuspid regurgitation.   02/25/23: Pt ambulated 228.6 meters O2 Sat ranged 99 on 96 on room air HR ranged 77-86    ASSESSMENT & PLAN:  Pulmonary Hypertension, severe tricuspid regurgitation, RV dysfunction -Echocardiogram today 10/12/23 with preserved LV function; mild ot mod MR, severe TR with a severely dilated RA.  She has had a right heart cath with a PVR of 3.5 Wood units, RA pressure of 20-30 with large V waves and a PA mean of 34 mmHg. -She does have PFTs from 2017 that demonstrate obstructive lung disease that is at least moderate from secondhand smoking exposure.   - Continue sildenafil 10mg  TID; despite having underlying mitral regurgitation she has had improvement in dyspnea with the  addition of sildenafil.  I believe this is likely due to reduction in TR /RV afterload.  - Will consider addition of low dose losartan in the future. - CT chest (03/24/23) w/o significant parenchymal lung disease.  - Continue digoxin 0.0658mcg; will repeat levels today.  - Continue bisoprolol to 2.5mg .  - Hypervolemic on exam; take torsemide 20mg  BID.   2.  Persistent atrial fibrillation -Continue bisoprolol -apixaban 2.5mg  BID -rate controlled today & asymptomatic   3. CAD s/p CABG - followed by [name] -No CP.    Linzie Boursiquot Advanced Heart Failure Mechanical Circulatory Support

## 2024-01-23 NOTE — Telephone Encounter (Signed)
 Can you call the patient and see if the 20 meq packets were less? We did those before. If so, we can have her take 20 meq every other day instead of 10 meq daily. She can't swallow the large tablets.

## 2024-01-23 NOTE — Telephone Encounter (Signed)
 Pt states she does not remember the cost of the 20 meq, or remembers taking them. Pt states we can try sending the 20 in.

## 2024-01-24 ENCOUNTER — Telehealth: Payer: Self-pay | Admitting: Nurse Practitioner

## 2024-01-24 NOTE — Telephone Encounter (Signed)
 Spoke with [place]. She is concerned if she needs to take her potassium chloride once daily or every other day. She said the prescription says once daily and some pharmacy lady told her every other day. Pt is concerned how she needs to take it. Please advise Florentina Addison

## 2024-01-24 NOTE — Telephone Encounter (Signed)
 Patient would like to know if she needs to take Potassium everyday. Patient phone number is [phone].

## 2024-01-24 NOTE — Telephone Encounter (Signed)
 Spoke with Obie Dredge regarding Caryl Asp previous note. Pt is aware and verbalized understanding. Nothing further needed.

## 2024-01-24 NOTE — Telephone Encounter (Signed)
 She needs to take the 10 meq capsule once, every day. We had to change her rx around due to cost and were trying to figure out what was most affordable so that may be where the confusion played in. Thanks!

## 2024-01-26 ENCOUNTER — Telehealth: Payer: Self-pay

## 2024-01-26 ENCOUNTER — Telehealth: Payer: Self-pay | Admitting: Cardiology

## 2024-01-26 NOTE — Telephone Encounter (Signed)
 Lvm for pt to call back in order to reschedule her appointment for earlier in the day per Dr. Gasper Lloyd.

## 2024-01-26 NOTE — Telephone Encounter (Incomplete)
 Called to confirm/remind patient of their appointment at the [facility] on 01/27/24***.   Appointment:   [x] Confirmed  [] Left mess   [] No answer/No voice mail  [] Phone not in service  Patient reminded to bring all medications and/or complete list.  Confirmed patient has transportation. Gave directions, instructed to utilize valet parking.

## 2024-01-27 ENCOUNTER — Ambulatory Visit: Attending: Cardiology | Admitting: Cardiology

## 2024-01-27 ENCOUNTER — Encounter: Payer: Self-pay | Admitting: Cardiology

## 2024-01-27 ENCOUNTER — Encounter: Payer: Medicare PPO | Admitting: Cardiology

## 2024-01-27 VITALS — BP 111/53 | HR 63 | Wt 110.2 lb

## 2024-01-27 DIAGNOSIS — I4811 Longstanding persistent atrial fibrillation: Secondary | ICD-10-CM | POA: Insufficient documentation

## 2024-01-27 DIAGNOSIS — Z951 Presence of aortocoronary bypass graft: Secondary | ICD-10-CM | POA: Insufficient documentation

## 2024-01-27 DIAGNOSIS — I081 Rheumatic disorders of both mitral and tricuspid valves: Secondary | ICD-10-CM | POA: Insufficient documentation

## 2024-01-27 DIAGNOSIS — Z90710 Acquired absence of both cervix and uterus: Secondary | ICD-10-CM | POA: Diagnosis not present

## 2024-01-27 DIAGNOSIS — J449 Chronic obstructive pulmonary disease, unspecified: Secondary | ICD-10-CM | POA: Diagnosis not present

## 2024-01-27 DIAGNOSIS — Z7722 Contact with and (suspected) exposure to environmental tobacco smoke (acute) (chronic): Secondary | ICD-10-CM | POA: Diagnosis not present

## 2024-01-27 DIAGNOSIS — E785 Hyperlipidemia, unspecified: Secondary | ICD-10-CM | POA: Insufficient documentation

## 2024-01-27 DIAGNOSIS — I272 Pulmonary hypertension, unspecified: Secondary | ICD-10-CM | POA: Insufficient documentation

## 2024-01-27 DIAGNOSIS — I10 Essential (primary) hypertension: Secondary | ICD-10-CM | POA: Insufficient documentation

## 2024-01-27 DIAGNOSIS — Z79899 Other long term (current) drug therapy: Secondary | ICD-10-CM | POA: Diagnosis not present

## 2024-01-27 DIAGNOSIS — I4821 Permanent atrial fibrillation: Secondary | ICD-10-CM | POA: Diagnosis not present

## 2024-01-27 DIAGNOSIS — Z7901 Long term (current) use of anticoagulants: Secondary | ICD-10-CM | POA: Diagnosis not present

## 2024-01-27 MED ORDER — TORSEMIDE 20 MG PO TABS
60.0000 mg | ORAL_TABLET | Freq: Every day | ORAL | 0 refills | Status: DC
Start: 2024-01-27 — End: 2024-02-10

## 2024-01-27 MED ORDER — TORSEMIDE 40 MG PO TABS: ORAL_TABLET | ORAL | 5 refills | Status: DC

## 2024-01-27 NOTE — Progress Notes (Signed)
 ADVANCED HEART FAILURE CLINIC NOTE  Referring Physician: Charlane Ferretti, DO  Primary Care: Charlane Ferretti, DO Primary Cardiologist: [name]  HPI: [name] is a 89 y.o. female with history of CABG in 2007 for left main disease by Dr. Dorris Fetch, persistent atrial fibrillation, hypertension, hyperlipidemia and history of hysterectomy in 2012 and recently diagnosed pulmonary hypertension and severe tricuspid regurgitation presenting today for follow up  Despite her age, [name] has remained very active.  She continues to go bowling and square dancing however now reports becoming increasingly limited due to exertional dyspnea.  Due to shortness of breath, patient had an echocardiogram that demonstrated severe tricuspid regurgitation with a severely dilated right atrium and severely elevated PA systolic pressures.  She had a right heart cath which further confirmed a PVR of 3-4 Wood units.    With additional of low dose GDMT, sildenafil & digoxin, she has continued to partake in a bowling league, drives, performs ADLs independently and lives a highly functional life.   - Hypervolemic on exam today with JVP to midneck; will increase torsemide to 60mg  x 1 day and then 40mg  in the AM and 20mg  in the evening. Currently taking 20mg  TID.   Current Outpatient Medications  Medication Sig Dispense Refill   potassium chloride (MICRO-K) 10 MEQ CR capsule TAKE 1 CAPSULE BY MOUTH EVERY DAY 30 capsule 1   acetaminophen (TYLENOL) 500 MG tablet Take 500 mg by mouth daily. May take 1 tablet in the afternoon     alendronate (FOSAMAX) 70 MG tablet Take 70 mg by mouth once a week.      apixaban (ELIQUIS) 2.5 MG TABS tablet Take 1 tablet (2.5 mg total) by mouth 2 (two) times daily. 180 tablet 3   atorvastatin (LIPITOR) 20 MG tablet TAKE 1 TABLET BY MOUTH EVERY DAY 90 tablet 2   bisoprolol (ZEBETA) 5 MG tablet TAKE 1 TABLET (5 MG TOTAL) BY MOUTH DAILY. 90 tablet 3   Calcium Carbonate Antacid (CALCIUM  CARBONATE PO) Take 1 tablet by mouth 2 (two) times daily.     cholecalciferol (VITAMIN D3) 25 MCG (1000 UNIT) tablet Take 1,000 Units by mouth daily.     digoxin (LANOXIN) 0.125 MG tablet Take 0.5 tablets (0.0625 mg total) by mouth daily. 45 tablet 3   docusate sodium (COLACE) 100 MG capsule Take 100 mg by mouth 2 (two) times daily.     famotidine (PEPCID) 20 MG tablet Take 20 mg by mouth at bedtime.     ferrous sulfate 325 (65 FE) MG tablet Take 1 tablet (325 mg total) by mouth daily.     FLUZONE HIGH-DOSE 0.5 ML injection Inject 0.5 mLs into the muscle once.     Hypromellose (ARTIFICIAL TEARS OP) Place 1 drop into both eyes every 6 (six) hours as needed (dry eyes).     levothyroxine (SYNTHROID, LEVOTHROID) 75 MCG tablet Take 37.5-75 mcg by mouth See admin instructions. Take 75 mg by mouth for 6 days a week and 37.5 mg every Saturday in the morning before breakfast     nitroGLYCERIN (NITROSTAT) 0.4 MG SL tablet Place 1 tablet (0.4 mg total) under the tongue every 5 (five) minutes as needed for chest pain. 25 tablet 0   Omega-3 Fatty Acids (FISH OIL) 1200 MG CAPS Take 1,200 mg by mouth 2 (two) times daily.     senna (SENNA LAX) 8.6 MG tablet Take 1 tablet (8.6 mg total) by mouth daily.     sildenafil (REVATIO) 20 MG tablet Take  0.5 tablets (10 mg total) by mouth in the morning and at bedtime. 30 tablet 3   Tiotropium Bromide-Olodaterol (STIOLTO RESPIMAT) 2.5-2.5 MCG/ACT AERS Inhale 2.5 mcg into the lungs daily at 6 (six) AM. INHALE 2 PUFFS BY MOUTH INTO THE LUNGS DAILY 12 g 3   torsemide (DEMADEX) 20 MG tablet Take 1 tablet (20 mg total) by mouth 2 (two) times daily. 60 tablet 5   torsemide (DEMADEX) 20 MG tablet Take 1 tablet (20 mg total) by mouth 3 (three) times daily for 3 days. 9 tablet 0   vitamin B-12 (CYANOCOBALAMIN) 1000 MCG tablet Take 1,000 mcg by mouth daily.     zolpidem (AMBIEN) 5 MG tablet Take 5 mg by mouth at bedtime.     No current facility-administered medications for this  visit.    PHYSICAL EXAM: Vitals:   01/27/24 1141  BP: (!) 111/53  Pulse: 63  SpO2: 100%   GENERAL: NAD Lungs- crackles at bases CARDIAC:  JVP: 14 cm          Normal rate with regular rhythm. systolic murmur.  Pulses 2+. 1+ edema.  ABDOMEN: Soft, non-tender, non-distended.  EXTREMITIES: Warm and well perfused.  NEUROLOGIC: No obvious FND    DATA REVIEW  ECG: 02/25/23: Atrial fibrillation  As per my personal interpretation  ECHO: 02/08/23 LVEF 55%, mildly reduced RV, severely dilated RA. severe TR, moderate MR,  As per my personal interpretation 10/12/23: LVEF 55%, mildly reduced RV function with moderate enlargement; severely dilated RA, severe TR, mild to mod MR, personally reviewed and interpreted.   CATH: 02/17/23:  Hemodynamic findings consistent with mild to moderate pulmonary hypertension.   Aortic saturation 98% noninvasively.  PA saturation 65%.  RA pressure 20/30, mean RA pressure 18 mmHg, RV pressure 58/3, PA pressure 57/19, mean PA pressure 34 mmHg, pulmonary capillary wedge pressure 18/29, mean pulmonary capillary wedge pressure 19.  Cardiac output 4.3 L/min, cardiac index 2.85, calculated PVR 3.48.   prominent V waves noted on the wedge tracing.   RA waveform suggestive of significant tricuspid regurgitation.   02/25/23: Pt ambulated 228.6 meters O2 Sat ranged 99 on 96 on room air HR ranged 77-86    ASSESSMENT & PLAN:  Pulmonary Hypertension, severe tricuspid regurgitation, RV dysfunction -Echocardiogram today 10/12/23 with preserved LV function; mild ot mod MR, severe TR with a severely dilated RA.  She has had a right heart cath with a PVR of 3.5 Wood units, RA pressure of 20-30 with large V waves and a PA mean of 34 mmHg. -She does have PFTs from 2017 that demonstrate obstructive lung disease that is at least moderate from secondhand smoking exposure.   - Continue sildenafil 10mg  TID; despite having underlying mitral regurgitation she has had improvement  in dyspnea with the addition of sildenafil.  I believe this is likely due to reduction in TR /RV afterload.  - Will consider addition of low dose losartan in the future. - CT chest (03/24/23) w/o significant parenchymal lung disease.  - Continue digoxin 0.0611mcg; will repeat levels today.  - Continue bisoprolol to 2.5mg ,HR 61 today; will continue 2.5mg   - Increased torsemide to 20mg  TID at her pulmonology appointment. We will increase to 40mg  qAM and 20mg  at night. Take 60mg  tomorrow.  - Repeat labs in 10 days.   2.  Long standing persistent atrial fibrillation -Continue bisoprolol -apixaban 2.5mg  BID -EKG today with atrial fibrillation.   3. CAD s/p CABG - followed by [name] -No CP.    Nicolette Gieske  Advanced Heart Failure Mechanical Circulatory Support

## 2024-01-27 NOTE — Patient Instructions (Signed)
 Medication Changes:  TAKE TORSEMIDE 60 MG FOR TOMORROW ONLY.  THEN, START TAKING 40 MG IN THE MORNING AND 20 MG IN THE EVENING.   Lab Work:  Go over to the [facility]. Go pass the gift shop and have your blood work completed IN 10 DAYS.  We will only call you if the results are abnormal or if the provider would like to make medication changes.   Follow-Up in: 2 months with Dr. Gasper Lloyd.  At the [facility], you and your health needs are our priority. We have a designated team specialized in the treatment of Heart Failure. This Care Team includes your primary Heart Failure Specialized Cardiologist (physician), Advanced Practice Providers (APPs- Physician Assistants and Nurse Practitioners), and Pharmacist who all work together to provide you with the care you need, when you need it.   You may see any of the following providers on your designated Care Team at your next follow up:  Dr. Arvilla Meres Dr. Marca Ancona Dr. Dorthula Nettles Dr. Theresia Bough Clarisa Kindred, FNP Enos Fling, RPH-CPP  Please be sure to bring in all your medications bottles to every appointment.   Need to Contact us:  If you have any questions or concerns before your next appointment please send [place] a message through [place] or call our office at [phone].    TO LEAVE A MESSAGE FOR THE NURSE SELECT OPTION 2, PLEASE LEAVE A MESSAGE INCLUDING: YOUR NAME DATE OF BIRTH CALL BACK NUMBER REASON FOR CALL**this is important as we prioritize the call backs  YOU WILL RECEIVE A CALL BACK THE SAME DAY AS LONG AS YOU CALL BEFORE 4:00 PM

## 2024-01-31 DIAGNOSIS — M545 Low back pain, unspecified: Secondary | ICD-10-CM | POA: Diagnosis not present

## 2024-01-31 DIAGNOSIS — I5032 Chronic diastolic (congestive) heart failure: Secondary | ICD-10-CM | POA: Diagnosis not present

## 2024-01-31 DIAGNOSIS — J449 Chronic obstructive pulmonary disease, unspecified: Secondary | ICD-10-CM | POA: Diagnosis not present

## 2024-01-31 DIAGNOSIS — J90 Pleural effusion, not elsewhere classified: Secondary | ICD-10-CM | POA: Diagnosis not present

## 2024-01-31 DIAGNOSIS — I4891 Unspecified atrial fibrillation: Secondary | ICD-10-CM | POA: Diagnosis not present

## 2024-01-31 DIAGNOSIS — I272 Pulmonary hypertension, unspecified: Secondary | ICD-10-CM | POA: Diagnosis not present

## 2024-01-31 DIAGNOSIS — M810 Age-related osteoporosis without current pathological fracture: Secondary | ICD-10-CM | POA: Diagnosis not present

## 2024-01-31 DIAGNOSIS — I739 Peripheral vascular disease, unspecified: Secondary | ICD-10-CM | POA: Diagnosis not present

## 2024-01-31 DIAGNOSIS — E876 Hypokalemia: Secondary | ICD-10-CM | POA: Diagnosis not present

## 2024-02-06 ENCOUNTER — Other Ambulatory Visit
Admission: RE | Admit: 2024-02-06 | Discharge: 2024-02-06 | Disposition: A | Discharge status: Home / Self Care | Source: Ambulatory Visit | Attending: Cardiology | Admitting: Cardiology

## 2024-02-06 DIAGNOSIS — I509 Heart failure, unspecified: Secondary | ICD-10-CM | POA: Insufficient documentation

## 2024-02-06 DIAGNOSIS — I4821 Permanent atrial fibrillation: Secondary | ICD-10-CM | POA: Diagnosis not present

## 2024-02-06 LAB — BASIC METABOLIC PANEL WITH GFR
Anion gap: 10 (ref 5–15)
BUN: 26 mg/dL — ABNORMAL HIGH (ref 8–23)
CO2: 25 mmol/L (ref 22–32)
Calcium: 9.2 mg/dL (ref 8.9–10.3)
Chloride: 104 mmol/L (ref 98–111)
Creatinine, Ser: 1.12 mg/dL — ABNORMAL HIGH (ref 0.44–1.00)
GFR, Estimated: 47 mL/min — ABNORMAL LOW (ref >60)
Glucose, Bld: 122 mg/dL — ABNORMAL HIGH (ref 70–99)
Potassium: 3.3 mmol/L — ABNORMAL LOW (ref 3.5–5.1)
Sodium: 139 mmol/L (ref 135–145)

## 2024-02-06 LAB — BRAIN NATRIURETIC PEPTIDE: B Natriuretic Peptide: 428.2 pg/mL — ABNORMAL HIGH (ref 0.0–100.0)

## 2024-02-09 ENCOUNTER — Other Ambulatory Visit: Payer: Self-pay | Admitting: Cardiology

## 2024-02-10 ENCOUNTER — Telehealth: Payer: Self-pay | Admitting: Cardiology

## 2024-02-10 ENCOUNTER — Ambulatory Visit: Payer: Medicare PPO | Attending: Cardiology | Admitting: Internal Medicine

## 2024-02-10 VITALS — BP 120/58 | HR 44 | Ht 62.0 in | Wt 108.0 lb

## 2024-02-10 DIAGNOSIS — E876 Hypokalemia: Secondary | ICD-10-CM | POA: Diagnosis not present

## 2024-02-10 DIAGNOSIS — I4821 Permanent atrial fibrillation: Secondary | ICD-10-CM

## 2024-02-10 DIAGNOSIS — I272 Pulmonary hypertension, unspecified: Secondary | ICD-10-CM

## 2024-02-10 MED ORDER — SPIRONOLACTONE 25 MG PO TABS: 12.5000 mg | ORAL_TABLET | Freq: Every day | ORAL | 3 refills | Status: AC

## 2024-02-10 NOTE — Progress Notes (Signed)
 Cardiology Office Note:  .    Date:  02/10/2024  ID:  [name], DOB 1932-10-01, MRN [id number] PCP: Charlane Ferretti, DO  [facility] Providers Cardiologist:  Dorthula Nettles, DO     CC: DOD: Fatigue and bradycardia; T-TEER Eval   History of Present Illness: .    [name] is a 89 y.o. female with permanent atrial fibrillation, coronary artery disease, and pulmonary hypertension who presents with fatigue and shortness of breath. She was referred by Dr. Gasper Lloyd for evaluation of a potential procedure.  She experiences fatigue and shortness of breath, particularly when walking short distances such as from the parking lot to the building, necessitating rest before continuing activities. She also has a persistent cough, which she is unsure if it is related to pollen or another cause. Additionally, she frequently feels cold, attributing this to her medication and frequent urination, which makes her feel weak.  She has a history of permanent atrial fibrillation and is currently experiencing bradycardia with heart rates as low as 44 bpm. Bisoprolol was recently discontinued due to her low heart rate. She feels tired and fatigued, which may be related to her heart rate.  She has pulmonary hypertension with severe tricuspid regurgitation and is on pulmonary vasoactive therapy. She takes torsemide, two tablets in the morning and one in the afternoon, to manage her symptoms. No current leg swelling is reported, although she experienced leg swelling in the past before starting her current medication regimen. She is also on sildenafil for pulmonary hypertension.  Her past medical history includes coronary artery disease, for which she underwent coronary artery bypass grafting in 2007. No current symptoms of coronary artery disease or chest pain are reported. Her LDL is well controlled at 38, and her blood pressure is also well managed.  She is active and participates in bowling on  Mondays, although she is considering stopping next year due to her symptoms. She values the social aspect of the activity and is hesitant to give it up.  Discussed the use of AI scribe software for clinical note transcription with the patient, who gave verbal consent to proceed.   Relevant histories: .  Social - former VS patient; AS patient presently ROS: As per HPI.   Studies Reviewed: .   Cardiac Studies & Procedures   ______________________________________________________________________________________________ CARDIAC CATHETERIZATION  CARDIAC CATHETERIZATION 02/17/2023  Narrative   Hemodynamic findings consistent with mild to moderate pulmonary hypertension.   Aortic saturation 98% noninvasively.  PA saturation 65%.  RA pressure 20/30, mean RA pressure 18 mmHg, RV pressure 58/3, PA pressure 57/19, mean PA pressure 34 mmHg, pulmonary capillary wedge pressure 18/29, mean pulmonary capillary wedge pressure 19.  Cardiac output 4.3 L/min, cardiac index 2.85, calculated PVR 3.48.   prominent V waves noted on the wedge tracing.   RA waveform suggestive of significant tricuspid regurgitation.  Pulmonary artery pressures by right heart cath appear less than what was estimated by echocardiogram.  Will discuss with heart failure team regarding further management of pulmonary hypertension and severe tricuspid regurgitation.  Findings Coronary Findings Diagnostic  Dominance: Right  No diagnostic findings have been documented. Intervention  No interventions have been documented.   STRESS TESTS  MYOCARDIAL PERFUSION IMAGING 02/08/2023  Narrative   The study is normal. The study is low risk.   No ST deviation was noted.   LV perfusion is normal. There is no evidence of ischemia. There is no evidence of infarction.   Left ventricular function is normal. Nuclear stress  EF: 56 %. The left ventricular ejection fraction is normal (55-65%). End diastolic cavity size is normal. End systolic  cavity size is normal.   Prior study available for comparison from 11/25/2015.   ECHOCARDIOGRAM  ECHOCARDIOGRAM COMPLETE 10/12/2023  Narrative ECHOCARDIOGRAM REPORT    Patient Name:   [name] Date of Exam: 10/12/2023 Medical Rec #:  [id number]      Height:       62.0 in Accession #:    [id number]     Weight:       110.2 lb Date of Birth:  1931-11-06       BSA:          1.484 m Patient Age:    89 years       BP:           127/71 mmHg Patient Gender: F              HR:           55 bpm. Exam Location:  Outpatient  Procedure: 2D Echo, 3D Echo, Cardiac Doppler, Color Doppler and Strain Analysis  Indications:    I50.40* Unspecified combined systolic (congestive) and diastolic (congestive) heart failure  History:        Patient has prior history of Echocardiogram examinations, most recent 02/08/2023. CHF, Abnormal ECG, Pulmonary HTN and COPD, Arrythmias:Atrial Fibrillation, Signs/Symptoms:Chest Pain, Shortness of Breath and Dyspnea; Risk Factors:Hypertension and Dyslipidemia.  Sonographer:    Sheralyn Boatman RDCS Referring Phys: [id number] ADITYA SABHARWAL  IMPRESSIONS   1. Left ventricular ejection fraction, by estimation, is 60 to 65%. The left ventricle has normal function. The left ventricle has no regional wall motion abnormalities. There is mild left ventricular hypertrophy. Left ventricular diastolic function could not be evaluated. There is the interventricular septum is flattened in diastole ('D' shaped left ventricle), consistent with right ventricular volume overload. The average left ventricular global longitudinal strain is -22.8 %. The global longitudinal strain is normal. 2. Degree of pulmonary hypertension based off RVSP is understimated due to dilated RV and RA size resulting in equalization of pressures. . Right ventricular systolic function is mildly reduced. The right ventricular size is severely enlarged. There is moderately elevated pulmonary artery systolic  pressure. The estimated right ventricular systolic pressure is 54.2 mmHg. 3. Left atrial size was mildly dilated. 4. Right atrial size was severely dilated. 5. There is no evidence of cardiac tamponade. 6. The mitral valve is degenerative. Moderate mitral valve regurgitation. No evidence of mitral stenosis. 7. The tricuspid valve is degenerative. Tricuspid valve regurgitation Tricuspid valve regurgitation is severe due to malcoaptation of leaflets. 8. The aortic valve is tricuspid. Aortic valve regurgitation is not visualized. Aortic valve sclerosis is present, with no evidence of aortic valve stenosis. 9. The inferior vena cava is dilated in size with <50% respiratory variability, suggesting right atrial pressure of 15 mmHg.  Comparison(s): A prior study was performed on 02/08/2023. LVEF remains preserved, severe LAE is now mild, RAE is now severe, RVSP was and now , see prior report for details.  FINDINGS Left Ventricle: Left ventricular ejection fraction, by estimation, is 60 to 65%. The left ventricle has normal function. The left ventricle has no regional wall motion abnormalities. The average left ventricular global longitudinal strain is -22.8 %. The global longitudinal strain is normal. The left ventricular internal cavity size was normal in size. There is mild left ventricular hypertrophy. The interventricular septum is flattened in diastole ('D' shaped left ventricle), consistent with right ventricular  volume overload. Left ventricular diastolic function could not be evaluated due to atrial fibrillation. Left ventricular diastolic function could not be evaluated.  Right Ventricle: Degree of pulmonary hypertension based off RVSP is understimated due to dilated RV and RA size resulting in equalization of pressures. The right ventricular size is severely enlarged. No increase in right ventricular wall thickness. Right ventricular systolic function is mildly reduced. There is  moderately elevated pulmonary artery systolic pressure. The tricuspid regurgitant velocity is 3.13 m/s, and with an assumed right atrial pressure of 15 mmHg, the estimated right ventricular systolic pressure is 54.2 mmHg.  Left Atrium: Left atrial size was mildly dilated.  Right Atrium: Right atrial size was severely dilated.  Pericardium: There is no evidence of pericardial effusion. There is no evidence of cardiac tamponade.  Mitral Valve: The mitral valve is degenerative in appearance. Moderate mitral valve regurgitation, with centrally-directed jet. No evidence of mitral valve stenosis.  Tricuspid Valve: The tricuspid valve is degenerative in appearance. Tricuspid valve regurgitation Tricuspid valve regurgitation is severe due to malcoaptation of leaflets. No evidence of tricuspid stenosis.  Aortic Valve: The aortic valve is tricuspid. Aortic valve regurgitation is not visualized. Aortic valve sclerosis is present, with no evidence of aortic valve stenosis.  Pulmonic Valve: The pulmonic valve was normal in structure. Pulmonic valve regurgitation is mild. No evidence of pulmonic stenosis.  Aorta: The aortic root and ascending aorta are structurally normal, with no evidence of dilitation.  Venous: The inferior vena cava is dilated in size with less than 50% respiratory variability, suggesting right atrial pressure of 15 mmHg.  IAS/Shunts: There is left bowing of the interatrial septum, suggestive of elevated right atrial pressure. No atrial level shunt detected by color flow Doppler.   LEFT VENTRICLE PLAX 2D LVIDd:         4.00 cm     Diastology LVIDs:         2.65 cm     LV e' medial:    7.51 cm/s LV PW:         1.10 cm     LV E/e' medial:  13.1 LV IVS:        1.20 cm     LV e' lateral:   17.30 cm/s LVOT diam:     2.00 cm     LV E/e' lateral: 5.7 LV SV:         74 LV SV Index:   50          2D Longitudinal Strain LVOT Area:     3.14 cm    2D Strain GLS Avg:     -22.8 %  LV  Volumes (MOD) LV vol d, MOD A2C: 65.1 ml LV vol d, MOD A4C: 59.4 ml LV vol s, MOD A2C: 27.9 ml LV vol s, MOD A4C: 22.4 ml LV SV MOD A2C:     37.2 ml LV SV MOD A4C:     59.4 ml LV SV MOD BP:      37.5 ml  RIGHT VENTRICLE            IVC RV S prime:     8.24 cm/s  IVC diam: 3.00 cm TAPSE (M-mode): 1.9 cm  LEFT ATRIUM             Index        RIGHT ATRIUM           Index LA diam:        5.20 cm 3.50 cm/m   RA  Area:     40.40 cm LA Vol (A2C):   49.3 ml 33.22 ml/m  RA Volume:   181.00 ml 121.95 ml/m LA Vol (A4C):   50.1 ml 33.76 ml/m LA Biplane Vol: 53.7 ml 36.18 ml/m AORTIC VALVE             PULMONIC VALVE LVOT Vmax:   121.00 cm/s PV Vmax:          2.57 m/s LVOT Vmean:  73.900 cm/s PV Peak grad:     26.4 mmHg LVOT VTI:    0.236 m     PR End Diast Vel: 1.81 msec  AORTA Ao Root diam: 2.80 cm Ao Asc diam:  3.40 cm  MITRAL VALVE                  TRICUSPID VALVE MV Area (PHT): 4.21 cm       TR Peak grad:   39.2 mmHg MV Decel Time: 180 msec       TR Vmax:        313.00 cm/s MR Peak grad:    66.3 mmHg MR Mean grad:    41.0 mmHg    SHUNTS MR Vmax:         407.00 cm/s  Systemic VTI:  0.24 m MR Vmean:        305.0 cm/s   Systemic Diam: 2.00 cm MR PISA:         0.25 cm MR PISA Eff ROA: 2 mm MR PISA Radius:  0.20 cm MV E velocity: 98.50 cm/s  Sunit Tolia Electronically signed by Tessa Lerner Signature Date/Time: 10/12/2023/6:08:56 PM    Final          ______________________________________________________________________________________________      Results    Physical Exam:    VS:  BP (!) 120/58 (BP Location: Left Arm)   Pulse (!) 44   Ht 5\' 2"  (1.575 m)   Wt 49 kg   SpO2 97%   BMI 19.75 kg/m    Wt Readings from Last 3 Encounters:  02/10/24 49 kg  01/27/24 50 kg  01/18/24 49.4 kg    Gen: no distress, elderly female   Neck: + JVD with HJR at 90 degrees; prominent V wave Cardiac: No Rubs or Gallops, holosystolic Murmur, slow irregular rate Respiratory:  Clear to auscultation bilaterally, decreased lung volumes, normal  respiratory rate GI: Soft, nontender, non-distended MS: No  edema;  moves all extremities Integument: Skin feels warm Neuro:  At time of evaluation, alert and oriented to person/place/time/situation  Psych: Normal affect, patient feels ok   ASSESSMENT AND PLAN: .    Pulmonary hypertension with severe tricuspid regurgitation - WHO Functional Class II, Stage C, hypervolemic, WHOI-III mixed picture in the setting of COPD - Diuretic regimen: Toresmide 40 mg PO AM and 20 mg PM - Fluid restriction of < 2 L  - :  Unable to perform - No signs or symptoms of connective tissue disease and no family history of the CT or PH  - No weight loss drug use  - No methamphetamine or other illicit substance use - No history of DVT or PE to suggest chronic thromboembolic disease; VQ Scan  - Pulm/AHF: Manam/Sabarwal  - 1/2 dose sildenafil monotherapy - REVEAL Lite 2 Risk score 8 (high risk) - She has mixed pulmonary hypertension, including pulmonary arterial hypertension in group two and group three, with severe tricuspid regurgitation and shortness of breath. The Triclip procedure was discussed but is not suitable at this time,  likely feasible with technically challenged in the setting of large coaptation gap; evidence of RV/PA uncoupling in 2024 imaging - Start spironolactone 12.5 mg - Repeat BMP in two weeks  Permanent atrial fibrillation with bradycardia She is in permanent atrial fibrillation with bradycardia, experiencing fatigue and low heart rates. Her heart rate was 44 today, which is considered too low. Bisoprolol is contributing to the bradycardia and will be discontinued. - Discontinue bisoprolol  Coronary artery disease status post CABG She underwent CABG in 2007 for coronary artery disease. She is currently asymptomatic with no chest pain and her LDL is well controlled at 38.  Hypokalemia She has hypokalemia, likely due  to diuretic therapy, which may be contributing to her symptoms. - Start spironolactone 12.5 mg - Repeat BMP in two weeks  Hypertension Her hypertension is well controlled with her current medication regimen.  Hyperlipidemia Her hyperlipidemia is well controlled, with an LDL of 38.  Follow-up She has a follow-up appointment with her cardiologist scheduled for April 20, 2024. Coordination for follow-up labs is necessary.  Riley Lam, MD FASE [facility] Cardiologist [facility]  [address] [place], [address] [place], [place] [address] [phone]  10:53 AM

## 2024-02-10 NOTE — Patient Instructions (Signed)
 Medication Instructions:  Your physician has recommended you make the following change in your medication:  STOP: Bisoprolol   START: spironolactone (Aldactone) 12.5 mg by mouth once daily.  (Half a pill)  *If you need a refill on your cardiac medications before your next appointment, please call your pharmacy*  Lab Work: IN 2 WEEKS: BMP  If you have labs (blood work) drawn today and your tests are completely normal, you will receive your results only by: MyChart Message (if you have MyChart) OR A paper copy in the mail If you have any lab test that is abnormal or we need to change your treatment, we will call you to review the results.  Testing/Procedures: NONE  Follow-Up:as scheduled  At [facility], you and your health needs are our priority.  As part of our continuing mission to provide you with exceptional heart care, our providers are all part of one team.  This team includes your primary Cardiologist (physician) and Advanced Practice Providers or APPs (Physician Assistants and Nurse Practitioners) who all work together to provide you with the care you need, when you need it.   Other Instructions       1st Floor: - Lobby - Registration  - Pharmacy  - Lab - Cafe  2nd Floor: - PV Lab - Diagnostic Testing (echo, CT, nuclear med)  3rd Floor: - Vacant  4th Floor: - TCTS (cardiothoracic surgery) - AFib Clinic - Structural Heart Clinic - Vascular Surgery  - Vascular Ultrasound  5th Floor: - HeartCare Cardiology (general and EP) - Clinical Pharmacy for coumadin, hypertension, lipid, weight-loss medications, and med management appointments    Valet parking services will be available as well.

## 2024-02-13 ENCOUNTER — Other Ambulatory Visit: Payer: Self-pay | Admitting: Cardiology

## 2024-02-13 ENCOUNTER — Other Ambulatory Visit: Payer: Self-pay

## 2024-02-13 MED ORDER — TORSEMIDE 40 MG PO TABS: ORAL_TABLET | ORAL | 3 refills | Status: DC

## 2024-02-13 NOTE — Telephone Encounter (Signed)
 Left message to call back

## 2024-02-13 NOTE — Telephone Encounter (Signed)
 Spoke with pt about pharmacy not having correct script for torsemide. Called pharmacy and verified correct dose. Sent in refill for correct dose.

## 2024-02-14 ENCOUNTER — Telehealth: Payer: Self-pay | Admitting: Cardiology

## 2024-02-14 ENCOUNTER — Other Ambulatory Visit: Payer: Self-pay

## 2024-02-14 IMAGING — DX DG CHEST 2V
2 series · 2 of 2 positions shown · non-contrast
Comparison: Chest radiograph 02/02/2022

CLINICAL DATA: History of pneumonia

EXAM:
CHEST - 2 VIEW

[chest pa]
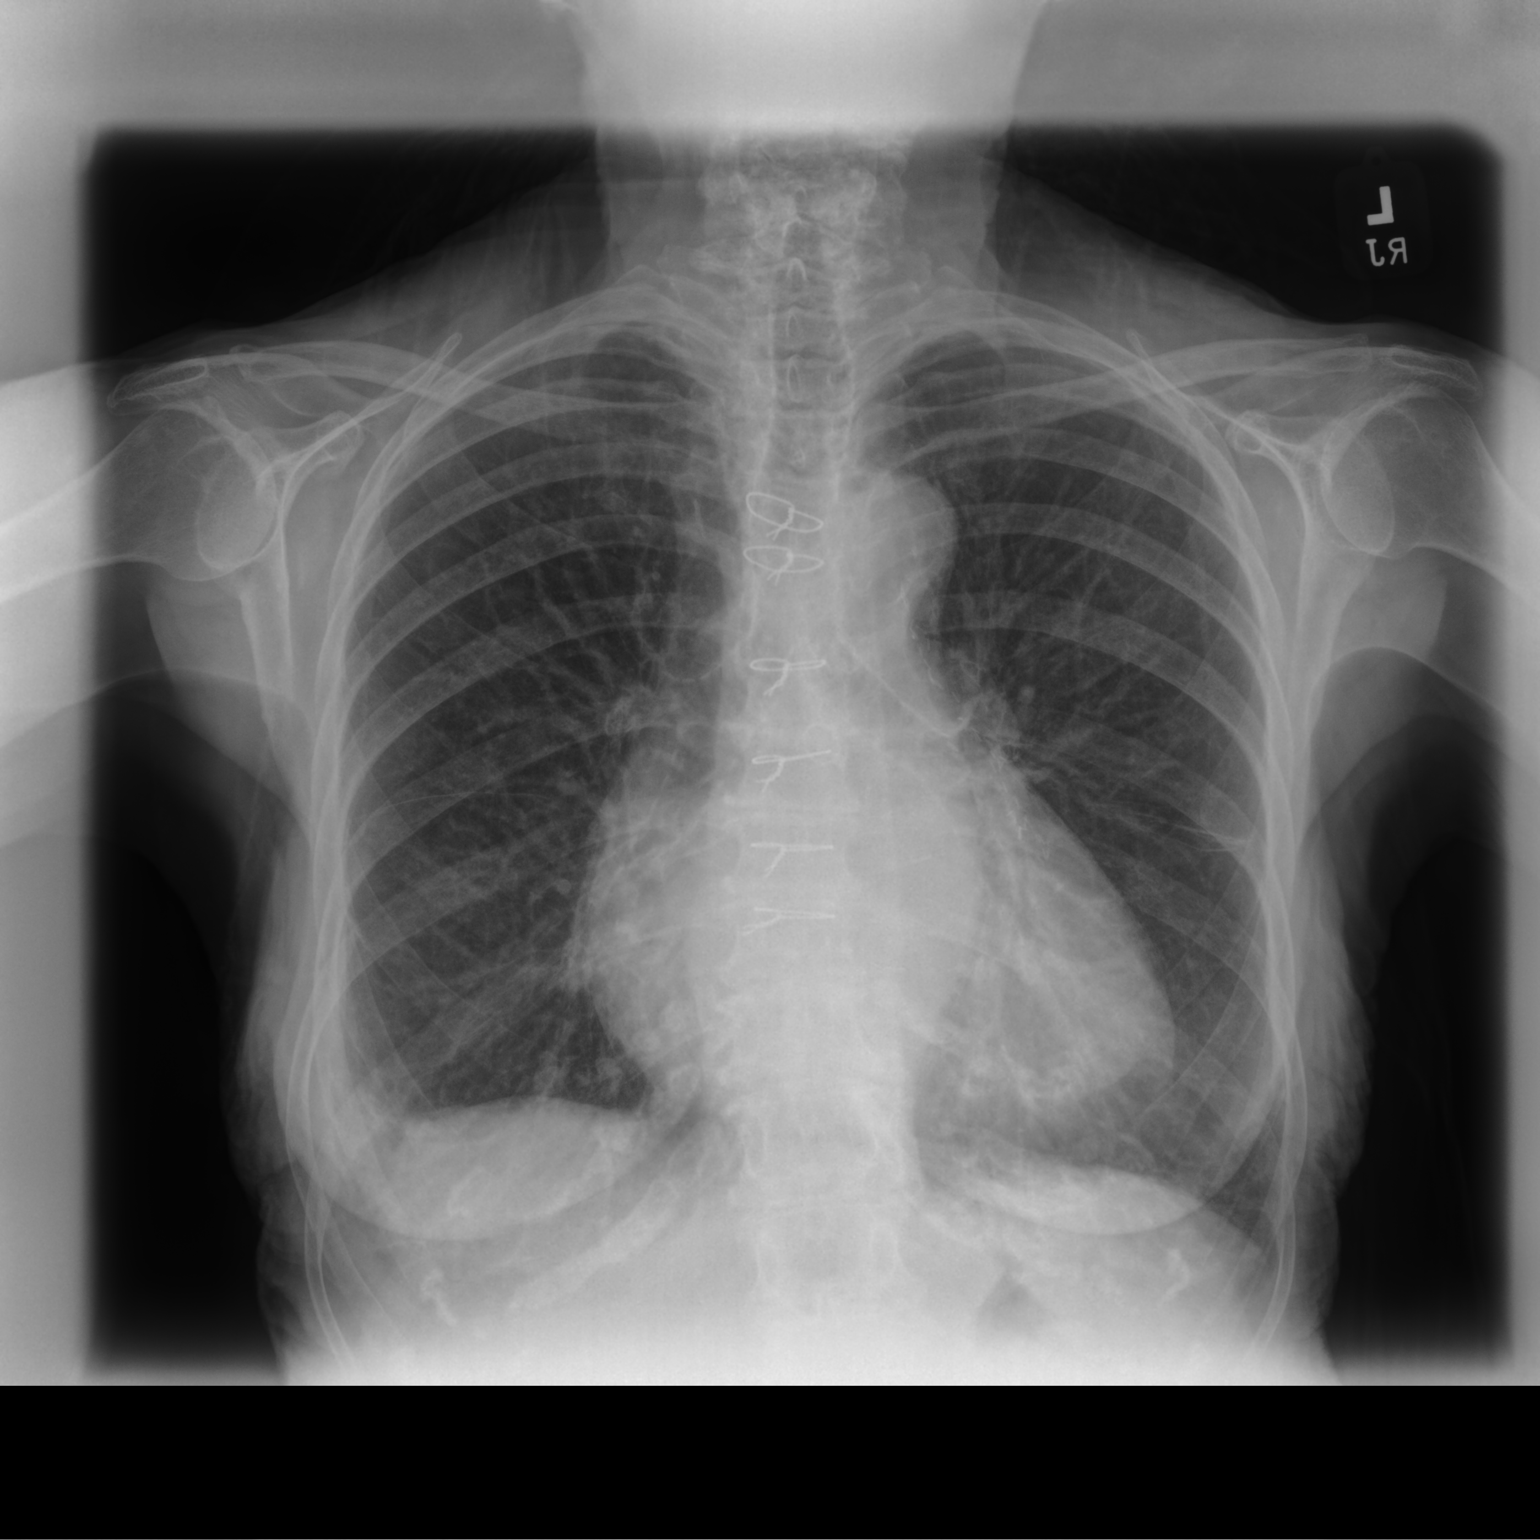

[chest lat]
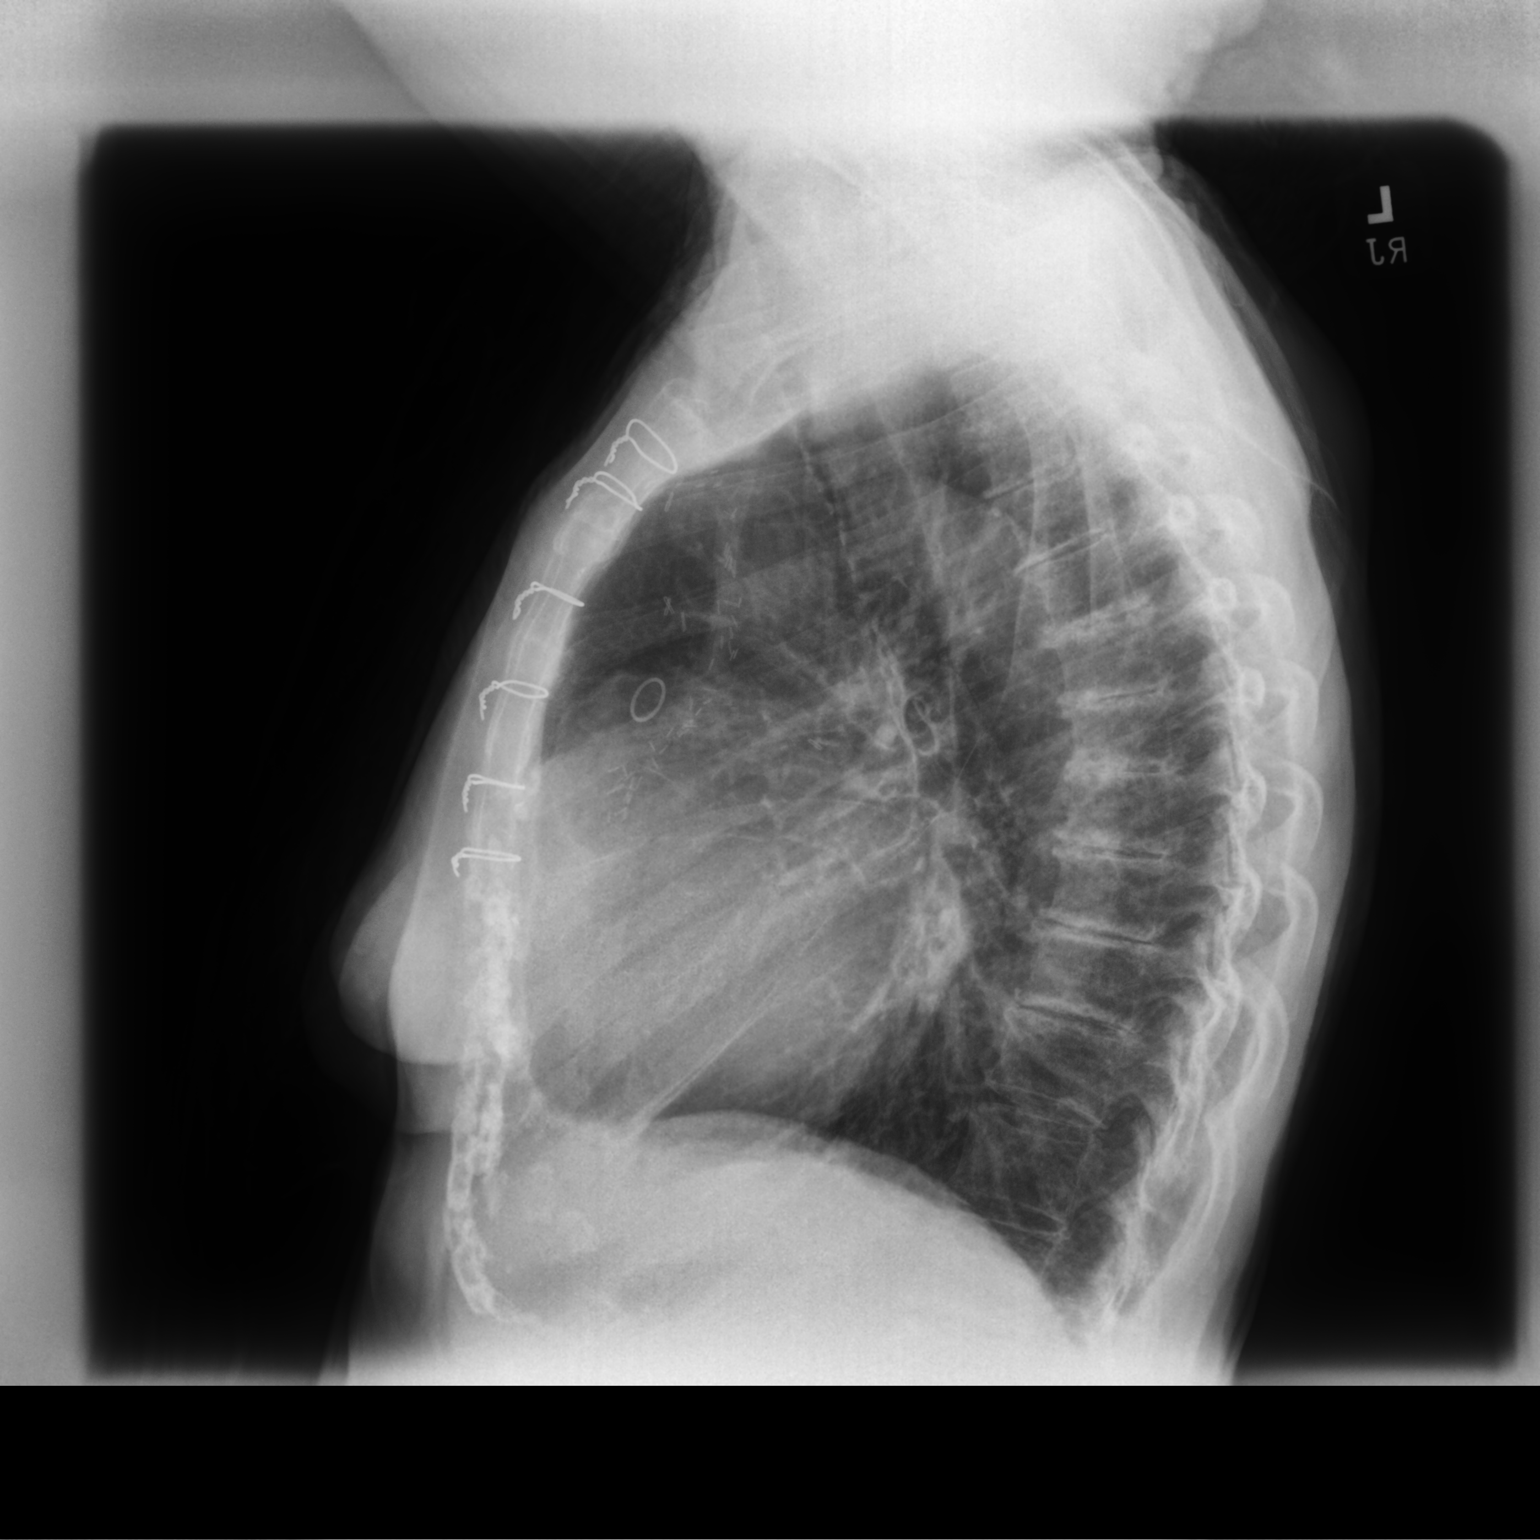

[2 of 2 positions shown; findings below may reference images not displayed]

FINDINGS: Stable cardiomegaly status post median sternotomy. Similar patchy
opacities right mid lower lung. No pleural effusion or pneumothorax.
Thoracic spine degenerative changes.
IMPRESSION: Similar patchy opacities right mid lower lung. Given the persistent
nature, consider further evaluation with chest CT. Khalil Rahman Asra, an
additional short-term follow-up chest radiograph should be
performed.

## 2024-02-14 MED ORDER — TORSEMIDE 20 MG PO TABS: ORAL_TABLET | ORAL | 5 refills | Status: DC

## 2024-02-14 NOTE — Progress Notes (Signed)
 Pharmacy requested Torsemide 40 MG tablets be changed to 20 MG tablets per pt's insurance coverage. Prescription changed to 20 mg tablets and resent.

## 2024-02-15 NOTE — Telephone Encounter (Signed)
 Spoke to pt. Answered questions about med changes made from cards earlier in week. Pt verbalized understanding and agreeable to plan.

## 2024-02-22 IMAGING — CT CT CHEST W/O CM
1 series · 15 of 34 positions shown, 19 images · non-contrast
Comparison: Radiograph dated March 02, 2022; chest CT dated [place]

CLINICAL DATA: Shortness of breath



[Series 2: chest w/o 2mm st · axial · non-contrast · 0.62mm/px · z∈[-277,-27]mm · 15 of 147 slices shown, 19 images]
[im 11/147  mediastinal]
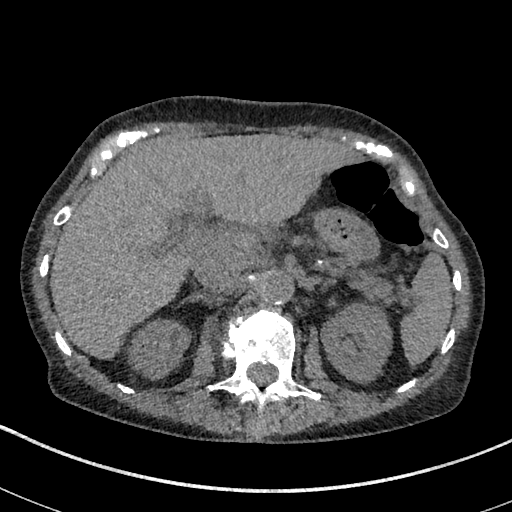
[im 11/147  lung]
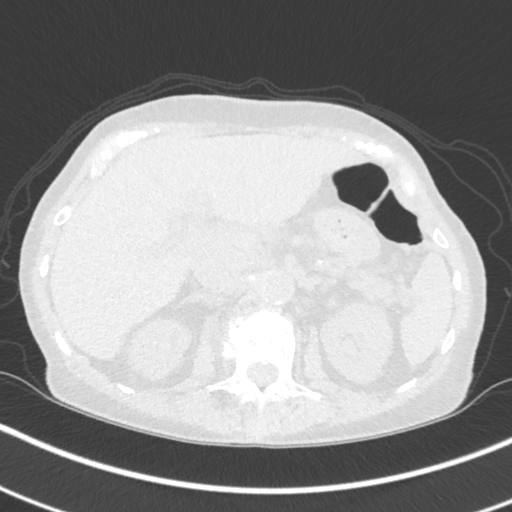
[im 22/147  lung]
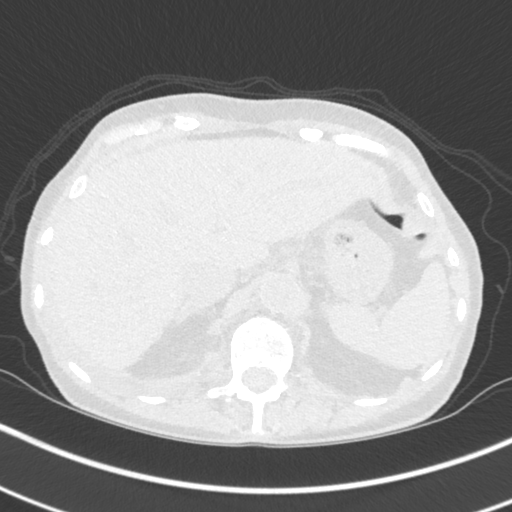
[im 30/147  lung]
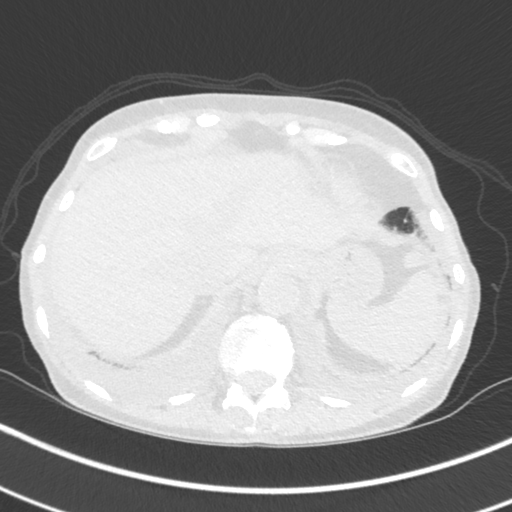
[im 38/147  lung]
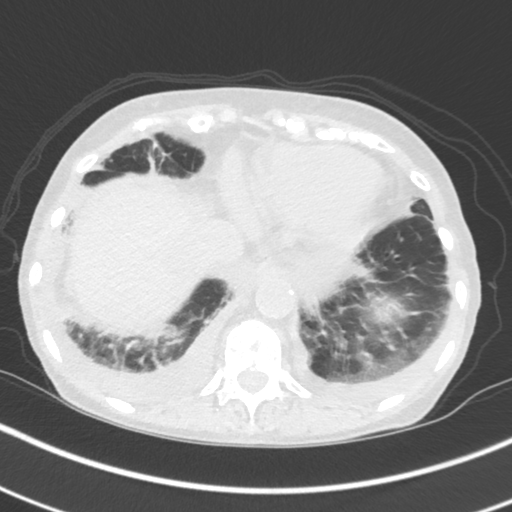
[im 49/147  mediastinal]
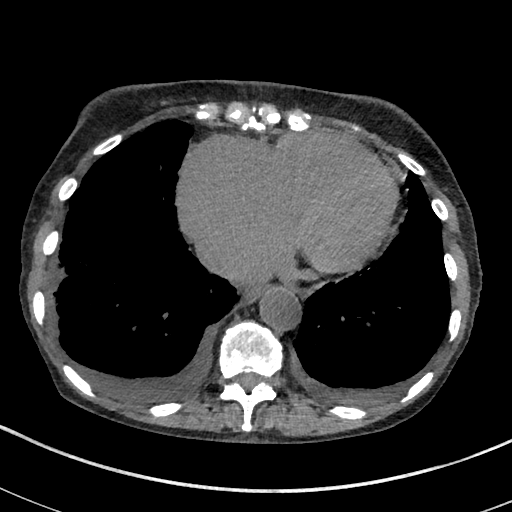
[im 49/147  lung]
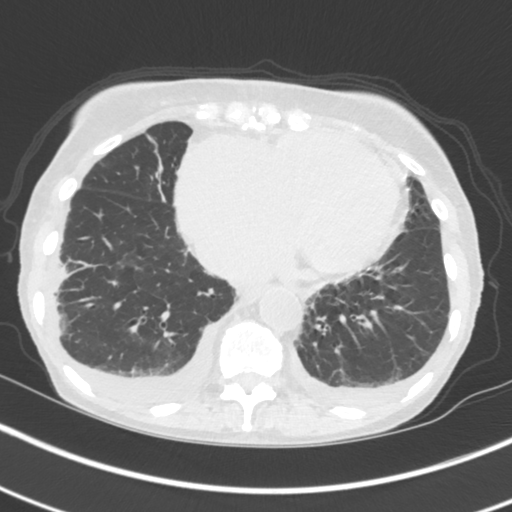
[im 59/147  lung]
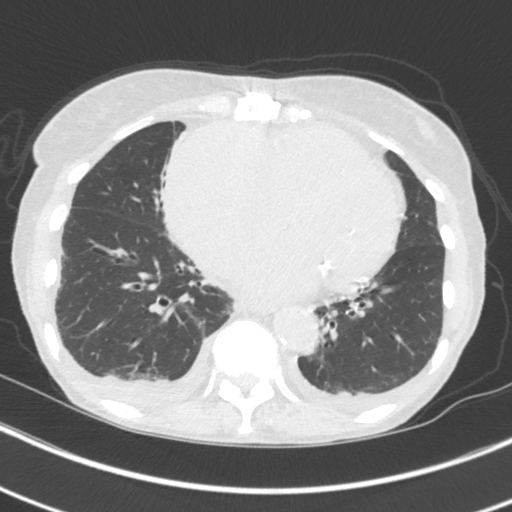
[im 65/147  lung]
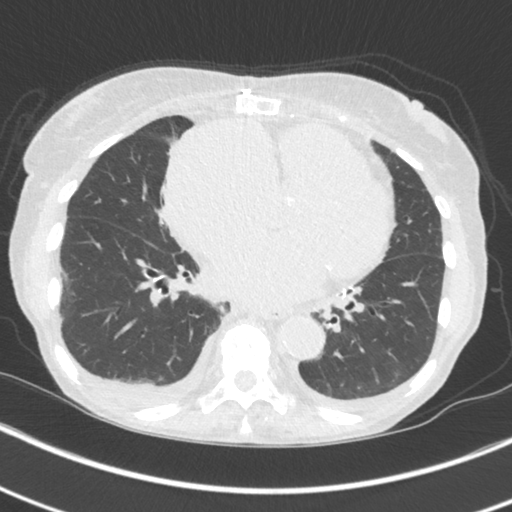
[im 76/147  lung]
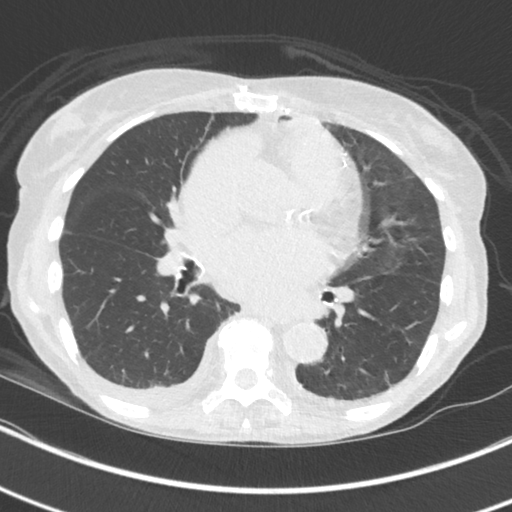
[im 82/147  mediastinal]
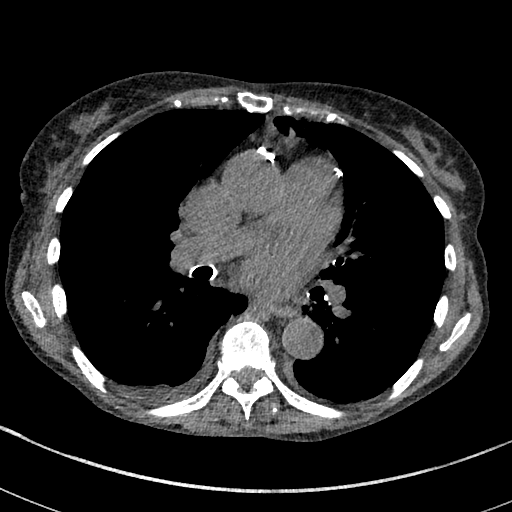
[im 82/147  lung]
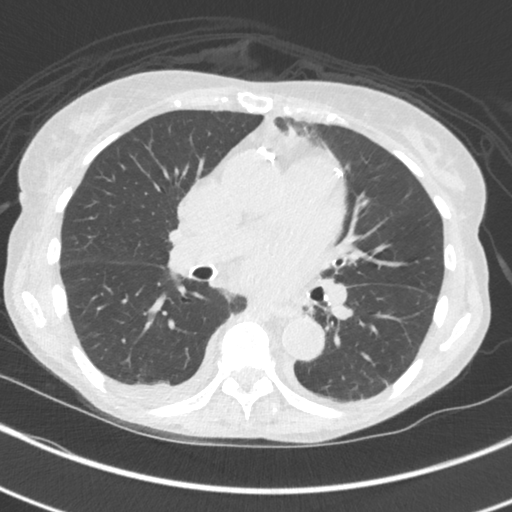
[im 88/147  lung]
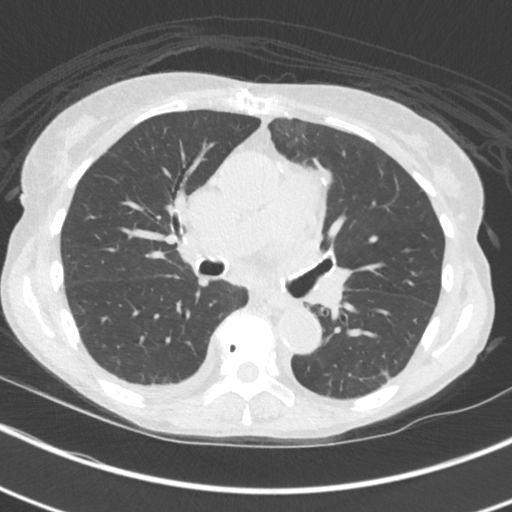
[im 98/147  lung]
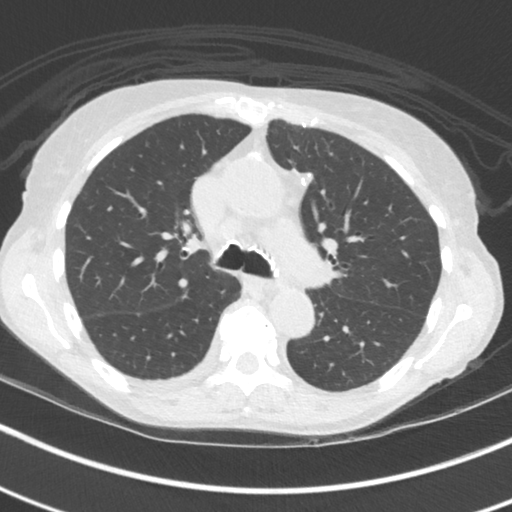
[im 109/147  lung]
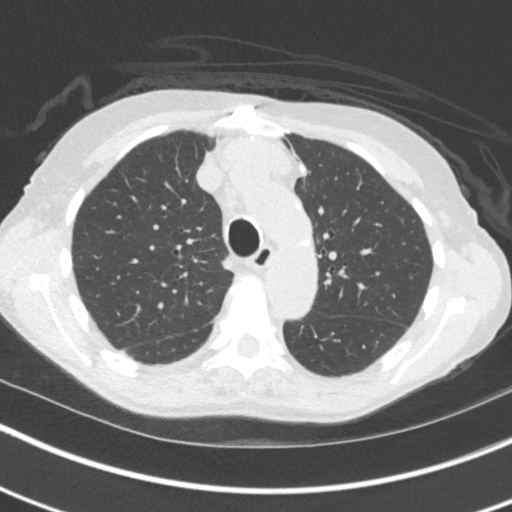
[im 117/147  mediastinal]
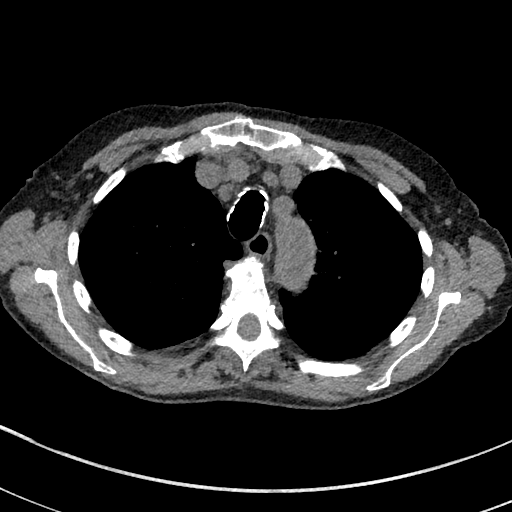
[im 117/147  lung]
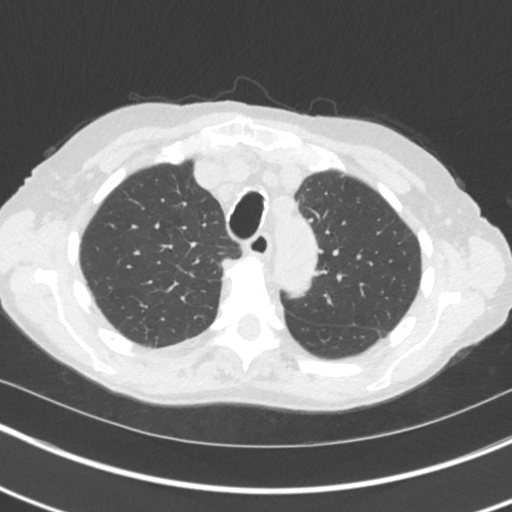
[im 125/147  lung]
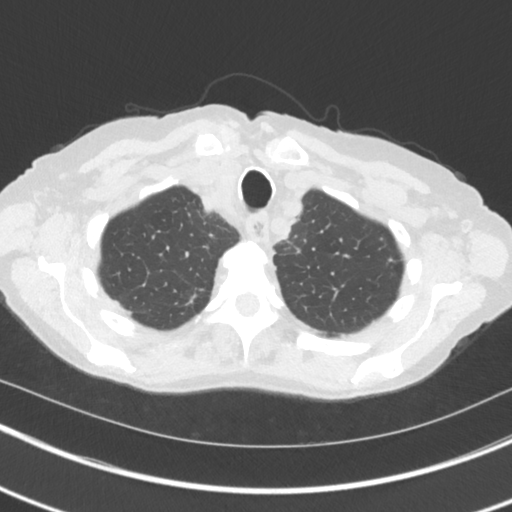
[im 136/147  lung]
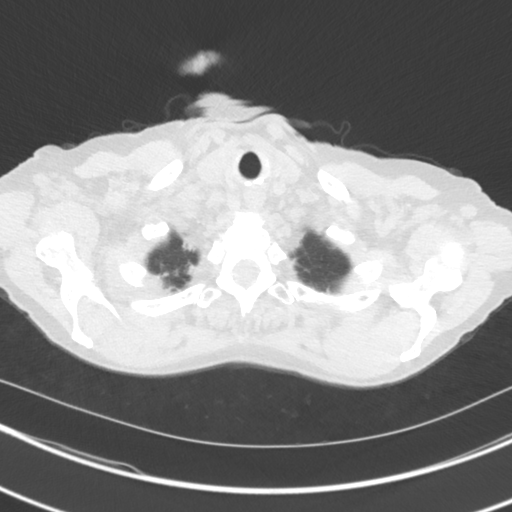

[15 of 34 positions shown; findings below may reference images not displayed]

FINDINGS: Cardiovascular: Cardiomegaly. No pericardial effusion. Normal
caliber thoracic aorta with atherosclerotic disease. Left main and
three-vessel coronary artery calcifications status post CABG.

Mediastinum/Nodes: Patulous esophagus. Thyroid is unremarkable.
Mildly enlarged mediastinal lymph nodes which are likely reactive.
Reference right lower paratracheal lymph node measuring 8 mm in
short axis on series 2 image 51.

Lungs/Pleura: Central airways are patent. Small bilateral pleural
effusions and bibasilar atelectasis. Peripheral consolidation of the
right lower lobe located on series 3, image 101, unchanged when
compared with prior and likely scarring. No acute consolidation.

Upper Abdomen: Stable subcentimeter low-attenuation lesion of the
right lobe of the liver located on series 2, image 125, likely
simple cyst. No acute abnormality.

Musculoskeletal: Prior median sternotomy with intact sternal wires.
Multilevel moderate degenerative disc disease. No aggressive
appearing osseous lesions.
IMPRESSION: 1. Opacity of the right lower lung seen on prior radiograph
correlates with a peripheral consolidation. Finding is unchanged
when compared with prior CT dated October 31, 2015 and likely due
to scarring.
2. Small bilateral pleural effusions and atelectasis.
3. Cardiomegaly and aortic Atherosclerosis ([id number]).

## 2024-03-02 ENCOUNTER — Other Ambulatory Visit
Admission: RE | Admit: 2024-03-02 | Discharge: 2024-03-02 | Disposition: A | Discharge status: Home / Self Care | Attending: Internal Medicine | Admitting: Internal Medicine

## 2024-03-02 DIAGNOSIS — I272 Pulmonary hypertension, unspecified: Secondary | ICD-10-CM | POA: Diagnosis not present

## 2024-03-02 DIAGNOSIS — I4821 Permanent atrial fibrillation: Secondary | ICD-10-CM | POA: Insufficient documentation

## 2024-03-02 DIAGNOSIS — E876 Hypokalemia: Secondary | ICD-10-CM | POA: Insufficient documentation

## 2024-03-02 LAB — BASIC METABOLIC PANEL WITH GFR
Anion gap: 10 (ref 5–15)
BUN: 16 mg/dL (ref 8–23)
CO2: 25 mmol/L (ref 22–32)
Calcium: 9 mg/dL (ref 8.9–10.3)
Chloride: 102 mmol/L (ref 98–111)
Creatinine, Ser: 1.01 mg/dL — ABNORMAL HIGH (ref 0.44–1.00)
GFR, Estimated: 53 mL/min — ABNORMAL LOW (ref >60)
Glucose, Bld: 95 mg/dL (ref 70–99)
Potassium: 4 mmol/L (ref 3.5–5.1)
Sodium: 137 mmol/L (ref 135–145)

## 2024-03-08 ENCOUNTER — Ambulatory Visit: Admitting: Nurse Practitioner

## 2024-03-08 ENCOUNTER — Encounter: Payer: Self-pay | Admitting: Nurse Practitioner

## 2024-03-08 VITALS — BP 138/72 | HR 66 | Ht 62.0 in | Wt 104.0 lb

## 2024-03-08 DIAGNOSIS — J449 Chronic obstructive pulmonary disease, unspecified: Secondary | ICD-10-CM

## 2024-03-08 DIAGNOSIS — J90 Pleural effusion, not elsewhere classified: Secondary | ICD-10-CM | POA: Diagnosis not present

## 2024-03-08 DIAGNOSIS — I482 Chronic atrial fibrillation, unspecified: Secondary | ICD-10-CM | POA: Diagnosis not present

## 2024-03-08 DIAGNOSIS — I509 Heart failure, unspecified: Secondary | ICD-10-CM

## 2024-03-08 NOTE — Patient Instructions (Signed)
-  Continue Stiolto 2 puffs daily -Continue toresemide and spironolactone  as prescribed by your heart doctor    Monitor your weights and notify your heart doctor of 2-3 lb weight gain overnight or 5 lb in a week    Glad you are feeling better!! Happy early birthday!   Follow up in 6 months with Dr. Waylan Haggard or Gina Lagos. If symptoms do not improve or worsen, please contact office for sooner follow up or seek emergency care.

## 2024-03-08 NOTE — Progress Notes (Signed)
 @Patient  ID: [name], female    DOB: 1932-10-04, 89 y.o.   MRN: [id number]  Chief Complaint  Patient presents with   Follow-up    Patient sates the torsemide  is working    Referring provider: Windell Hasty, DO  HPI: 89 year old female, never smoker followed for moderate COPD and PH. She is a patient of Dr. Isabel Many and last seen in office 01/18/2024 by [facility] NP. Past medical history significant for RV failure, a fib on Eliquis , HTN, severe tricuspid regurgitation, hypothyroid, HLD, IDA.   TEST/EVENTS:  12/17/2015 PFT: FVC 125, FEV1 65, ratio 44, TLC 103, DLCOcor 74.  10/12/2023 echo: EF 60-65%. Mild LVH. Dilated RV and RA. Mildly reduced RV function, severely enlarged. Moderately elevated PASP. LA mildly dilated. RA severely dilated. Moderate MR. TR severe.  12/03/2023 CTA chest: cardiac enlargement with particular enlargement of right heart. S/p CABG. Esophagus decompressed. Moderate right and small left pleural effusions. B/l basilar atelectasis with patchy infiltrates in right lung, atelectasis vs pna.  12/04/2023 CXR: post thoracentesis. Small right effusion, decreased.  01/04/2024 CXR: stable small right effusion with atelectasis  01/18/2024 CXR: decreased opacity with resolved pleural effusion, consistent with atelectasis.   08/11/2023: Ov with Dr. Waylan Haggard. Reports SOB with minimal exertion, such as walking her dog. No issues with bowling. Currently managed for PH with sildenafil , which caused some dizziness so now she's taking a half dose. Follows with cardiology. Uses Stiolto for COPD. She does have leg swelling, which she takes lasix  for and wearing compression stockings. Never smoker. Has a diagnosis of COPD on record but unsure if PFT is accurate as there is an abnormal, disproportionately blunted expiratory flow loop suggestive of poor effort. Can repeat PFT but pt declined. Continue Stiolto. B/l effusions with partial loculation on the right; does not appear infected. Suspect this  may be from CHF. Confinue lasix . Alpha 1 MS phenotype, normal levels.   01/04/2024: OV with Kyandre Okray NP for follow up with her son. She was seen in the ED 2/1 for 1 week worsening cough, dyspnea, orthopnea. She had tried increasing lasix  at home but no improvement so she went to UC where CXR showed a large right effusion. She was sent to the ED for further evaluation/management. She had thoracentesis with bloody fluid. Concern for parapneumonic vs inflammatory effusion given appearance. She was treated with augmentin  for 7 days and advised to increase lasix  for 2 days. Cytology was negative for malignancy. She was discharged home. Since then, she's seen cardiology who changed her to torsemide  twice daily.  She tells me today that she was feeling better but over the past few days, she's had some more shortness of breath with activity. She also feels like her abdomen is a little more bloated. Her legs are a little swollen but no where nearly how they were in January. Her weight is up about 5 lb from her dry weight of 5 lb. She admits she hasn't been taking her diuretic daily. She denies any cough, wheezing, PND, chest pain, palpitations, hemoptysis, fevers, chills.  She does also tell me she's been having some blood in her stool since she started the lasix . She says it's a small amount and someone told her it was probably hemorrhoids. She does occasionally have some soreness when she wipes. She does admit she doesn't drink much water. She's not taking anything over the counter. She is on Eliquis . No lightheadedness/dizziness, abdominal pain, weight loss, anorexia.   01/18/2024: OV with Allyna Pittsley NP for follow up  with her son.  She is feeling relatively well.  Feels like she is mostly back to her normal.  Does still have some shortness of breath with more strenuous activities.  Her swelling was doing better and her weight was also down to 103 pounds, which is her dry weight, after taking torsemide  3 times a day.  She has  since returned to twice daily dosing and her weight has slowly crept back up again.  She also feels like she has the bloating in her abdomen which she has had in the past when she feels more swollen.  Legs look okay for right now.  She is wearing compression stockings.  No orthopnea, PND, palpitations, chest pain, cough, wheezing, hemoptysis, fevers, chills. Using her Stiolto daily.  Does not ever have to use her rescue. Pleural effusion at her last office visit it was stable. She has had some occasional cramping in her legs, which is not necessarily new for her.  She did take the potassium that I had prescribed at her last visit while she was on the increased dose of torsemide  but no longer taking any.  She did like the powder.  Has had difficulties with the pills in the past.  She has an upcoming appointment with cardiology on 3/28. Hasn't noticed any blood in her stool recently. Last hemoglobin was normal. Compliant with AC. No excessive bruising. She is trying to increase her water intake over other liquids to help with constipation   03/08/2024: Today - follow up Patient presents today for follow-up with her son.  She has been doing much better over the past month or so.  She saw cardiology who adjusted her torsemide  and added on spironolactone .  Her potassium levels have been stable since they made this adjustment as well.  She was having some issues with cramping in her lower legs with just the torsemide .  She not had any more issues with swelling or weight gain.  Seems to be down to her dry weight and is steady there.  Breathing feels much better.  She is actually been able to get out and do more things.  She has been walking around their pond, which is approximately a third of a mile.  No significant cough, chest congestion, wheezing.  Not having to use her rescue inhaler. Still using Stiolto.   Allergies  Allergen Reactions   Sulfa Antibiotics Rash    Immunization History  Administered Date(s)  Administered   Fluad Quad(high Dose 65+) 07/05/2019, 08/03/2022, 08/04/2023   Influenza Split 07/16/2015   Influenza, High Dose Seasonal PF 07/24/2014, 07/21/2016, 08/03/2017, 07/03/2018   Influenza, Quadrivalent, Recombinant, Inj, Pf 07/13/2020, 07/13/2021   PFIZER(Purple Top)SARS-COV-2 Vaccination 11/27/2019, 12/18/2019, 09/02/2020   Pneumococcal Conjugate-13 08/01/2017   Tdap 07/24/2022   Zoster Recombinant(Shingrix) 04/19/2018, 06/21/2018    Past Medical History:  Diagnosis Date   Chronic atrial fibrillation (HCC)    a. Pt declined cardioversion.   Coronary artery disease    a. s/p CABGx2 06/2006 (LIMA-LAD, SVG-LCx at bifurcation of OM II/III). b. Nuc 11/2012: no evidence of ischemia, attenuation artifact c/w breast attenuation in anterior region, essentially low risk, EF 88%.   Essential hypertension    Hyperlipidemia    Hypothyroidism    Insomnia    on ambien , failed other meds   Iron deficiency anemia    Osteoarthritis    Osteopenia    involved on BMD 06/01/07 (T score-1.6); slightly worse in 3/13 (T score -1.6; FRAX 3%  and 12%)-plan rechcekin 2016  Uterine prolapse    post hysterectomy, Dr. Serge Dancer   UTI (lower urinary tract infection) 3/11    Tobacco History: Social History   Tobacco Use  Smoking Status Never   Passive exposure: Past  Smokeless Tobacco Never   Counseling given: Not Answered   Outpatient Medications Prior to Visit  Medication Sig Dispense Refill   acetaminophen  (TYLENOL ) 500 MG tablet Take 500 mg by mouth daily. May take 1 tablet in the afternoon     alendronate (FOSAMAX) 70 MG tablet Take 70 mg by mouth once a week.      apixaban  (ELIQUIS ) 2.5 MG TABS tablet Take 1 tablet (2.5 mg total) by mouth 2 (two) times daily. 180 tablet 3   atorvastatin  (LIPITOR) 20 MG tablet TAKE 1 TABLET BY MOUTH EVERY DAY 90 tablet 2   Calcium  Carbonate Antacid (CALCIUM  CARBONATE PO) Take 1 tablet by mouth 2 (two) times daily.     carboxymethylcellulose (REFRESH PLUS)  0.5 % SOLN Place 1 drop into both eyes as needed (dry eyes).     cholecalciferol (VITAMIN D3) 25 MCG (1000 UNIT) tablet Take 1,000 Units by mouth daily.     digoxin  (LANOXIN ) 0.125 MG tablet Take 0.5 tablets (0.0625 mg total) by mouth daily. 45 tablet 3   docusate sodium (COLACE) 100 MG capsule Take 100 mg by mouth 2 (two) times daily.     famotidine  (PEPCID ) 20 MG tablet Take 20 mg by mouth at bedtime.     ferrous sulfate  325 (65 FE) MG tablet Take 1 tablet (325 mg total) by mouth daily.     Hypromellose (ARTIFICIAL TEARS OP) Place 1 drop into both eyes every 6 (six) hours as needed (dry eyes).     levothyroxine  (SYNTHROID , LEVOTHROID) 75 MCG tablet Take 37.5-75 mcg by mouth See admin instructions. Take 75 mg by mouth for 6 days a week and 37.5 mg every Saturday in the morning before breakfast     nitroGLYCERIN  (NITROSTAT ) 0.4 MG SL tablet Place 1 tablet (0.4 mg total) under the tongue every 5 (five) minutes as needed for chest pain. 25 tablet 0   Omega-3 Fatty Acids (FISH OIL) 1200 MG CAPS Take 1,200 mg by mouth 2 (two) times daily.     potassium chloride  (MICRO-K ) 10 MEQ CR capsule TAKE 1 CAPSULE BY MOUTH EVERY DAY 30 capsule 1   senna (SENNA LAX) 8.6 MG tablet Take 1 tablet (8.6 mg total) by mouth daily.     sildenafil  (REVATIO ) 20 MG tablet Take 0.5 tablets (10 mg total) by mouth in the morning and at bedtime. 30 tablet 3   spironolactone  (ALDACTONE ) 25 MG tablet Take 0.5 tablets (12.5 mg total) by mouth daily. 45 tablet 3   Tiotropium Bromide -Olodaterol (STIOLTO RESPIMAT ) 2.5-2.5 MCG/ACT AERS Inhale 2.5 mcg into the lungs daily at 6 (six) AM. INHALE 2 PUFFS BY MOUTH INTO THE LUNGS DAILY 12 g 3   torsemide  (DEMADEX ) 20 MG tablet Take 2 tablets (40 MG) in the morning, and one tablet (20 MG) at bedtime. 90 tablet 5   vitamin B-12 (CYANOCOBALAMIN ) 1000 MCG tablet Take 1,000 mcg by mouth daily.     zolpidem  (AMBIEN ) 5 MG tablet Take 5 mg by mouth at bedtime.     No facility-administered  medications prior to visit.     Review of Systems:   Constitutional: No weight change, night sweats, fevers, chills, fatigue, or lassitude.  HEENT: No headaches, difficulty swallowing, tooth/dental problems, or sore throat. No sneezing, itching, ear ache, nasal congestion, or post  nasal drip CV:  No chest pain, orthopnea, PND, anasarca, dizziness, palpitations, syncope,  swelling in lower extremities  Resp: +shortness of breath with exertion (improved). No excess mucus or change in color of mucus. No productive or non-productive. No hemoptysis. No wheezing.  No chest wall deformity GI:  No heartburn, indigestion, abdominal pain, nausea, vomiting, diarrhea, change in bowel habits, loss of appetite, blood in stool GU: No dysuria, change in color of urine, urgency or frequency.   Skin: No rash, lesions, ulcerations MSK:  No joint pain or swelling. Neuro: No dizziness or lightheadedness.  Psych: No depression or anxiety. Mood stable.     Physical Exam:  BP 138/72 (BP Location: Left Arm, Patient Position: Sitting, Cuff Size: Normal)   Pulse 66   Ht 5\' 2"  (1.575 m)   Wt 104 lb (47.2 kg)   SpO2 96%   BMI 19.02 kg/m   GEN: Pleasant, interactive, well-kempt; elderly; in no acute distress HEENT:  Normocephalic and atraumatic. PERRLA. Sclera white. Nasal turbinates pink, moist and patent bilaterally. No rhinorrhea present. Oropharynx pink and moist, without exudate or edema. No lesions, ulcerations, or postnasal drip.  NECK:  Supple w/ fair ROM. No JVD present. Normal carotid impulses w/o bruits. Thyroid symmetrical with no goiter or nodules palpated. No lymphadenopathy.   CV: Irregular rhythm, rate controlled, systolic murmur, no BLE edema. Pulses intact, +2 bilaterally. No cyanosis, pallor or clubbing. PULMONARY:  Unlabored, regular breathing. Clear bilaterally A&P w/o wheezes/rales/rhonchi. No accessory muscle use.  GI: BS present and normoactive. Soft, non-tender to palpation. No  organomegaly or masses detected.  MSK: No erythema, warmth or tenderness. Cap refil <2 sec all extrem. No deformities or joint swelling noted.  Neuro: A/Ox3. No focal deficits noted.   Skin: Warm, no lesions or rashe Psych: Normal affect and behavior. Judgement and thought content appropriate.     Lab Results:  CBC    Component Value Date/Time   WBC 5.9 01/04/2024 1638   RBC 3.38 (L) 01/04/2024 1638   HGB 10.0 (L) 01/04/2024 1638   HGB 10.4 (L) 02/15/2023 1514   HCT 30.9 (L) 01/04/2024 1638   HCT 32.8 (L) 02/15/2023 1514   PLT 127.0 (L) 01/04/2024 1638   PLT 92 (LL) 02/15/2023 1514   MCV 91.3 01/04/2024 1638   MCV 92 02/15/2023 1514   MCH 29.8 12/04/2023 0542   MCHC 32.5 01/04/2024 1638   RDW 16.6 (H) 01/04/2024 1638   RDW 13.3 02/15/2023 1514   LYMPHSABS 1.0 01/04/2024 1638   MONOABS 0.7 01/04/2024 1638   EOSABS 0.1 01/04/2024 1638   BASOSABS 0.1 01/04/2024 1638    BMET    Component Value Date/Time   NA 137 03/02/2024 1154   NA 144 12/27/2023 1142   K 4.0 03/02/2024 1154   CL 102 03/02/2024 1154   CO2 25 03/02/2024 1154   GLUCOSE 95 03/02/2024 1154   BUN 16 03/02/2024 1154   BUN 17 12/27/2023 1142   CREATININE 1.01 (H) 03/02/2024 1154   CREATININE 0.83 08/20/2016 1056   CALCIUM  9.0 03/02/2024 1154   GFRNONAA 53 (L) 03/02/2024 1154   GFRAA 75 04/04/2018 1032    BNP    Component Value Date/Time   BNP 428.2 (H) 02/06/2024 1454   BNP 194.8 (H) 08/20/2016 1056     Imaging:  No results found.   Administration History     None          Latest Ref Rng & Units 12/17/2015    9:35 AM  PFT Results  FVC-Pre L 2.65   FVC-Predicted Pre % 128   FVC-Post L 2.48   FVC-Predicted Post % 120   Pre FEV1/FVC % % 37   Post FEV1/FCV % % 44   FEV1-Pre L 0.99   FEV1-Predicted Pre % 65   FEV1-Post L 1.09   DLCO uncorrected ml/min/mmHg 12.88   DLCO UNC% % 68   DLCO corrected ml/min/mmHg 14.04   DLCO COR %Predicted % 74   DLVA Predicted % 75   TLC L 4.62    TLC % Predicted % 103   RV % Predicted % 87     No results found for: "NITRICOXIDE"      Assessment & Plan:   CHF (congestive heart failure) (HCC) Clinically improved. She is at her dry weight. Doing well on torsemide  and spironolactone .  Advised to continue monitoring at home. Follow up with cardiology as scheduled.   Patient Instructions  -Continue Stiolto 2 puffs daily -Continue toresemide and spironolactone  as prescribed by your heart doctor    Monitor your weights and notify your heart doctor of 2-3 lb weight gain overnight or 5 lb in a week    Glad you are feeling better!! Happy early birthday!   Follow up in 6 months with Dr. Waylan Haggard or Gina Lagos. If symptoms do not improve or worsen, please contact office for sooner follow up or seek emergency care.    Pleural effusion Parapneumonic, likely with component related to volume overload. She is clinically improved. Resolved effusions/pna on imaging. Continue to monitor.   COPD (chronic obstructive pulmonary disease) (HCC) Stable. Continue Stiolto. Action plan in place.   Chronic atrial fibrillation (HCC) Rate controlled. Follow up with cardiology as scheduled.      Advised if symptoms do not improve or worsen, to please contact office for sooner follow up or seek emergency care.   I spent 25 minutes of dedicated to the care of this patient on the date of this encounter to include pre-visit review of records, face-to-face time with the patient discussing conditions above, post visit ordering of testing, clinical documentation with the electronic health record, making appropriate referrals as documented, and communicating necessary findings to members of the patients care team.  Roetta Clarke, NP 03/09/2024  Pt aware and understands NP's role.

## 2024-03-09 ENCOUNTER — Encounter: Payer: Self-pay | Admitting: Nurse Practitioner

## 2024-03-09 NOTE — Assessment & Plan Note (Signed)
 Stable. Continue Stiolto. Action plan in place.

## 2024-03-09 NOTE — Assessment & Plan Note (Signed)
 Clinically improved. She is at her dry weight. Doing well on torsemide  and spironolactone .  Advised to continue monitoring at home. Follow up with cardiology as scheduled.   Patient Instructions  -Continue Stiolto 2 puffs daily -Continue toresemide and spironolactone  as prescribed by your heart doctor    Monitor your weights and notify your heart doctor of 2-3 lb weight gain overnight or 5 lb in a week    Glad you are feeling better!! Happy early birthday!   Follow up in 6 months with Dr. Waylan Haggard or Gina Lagos. If symptoms do not improve or worsen, please contact office for sooner follow up or seek emergency care.

## 2024-03-09 NOTE — Assessment & Plan Note (Signed)
 Parapneumonic, likely with component related to volume overload. She is clinically improved. Resolved effusions/pna on imaging. Continue to monitor.

## 2024-03-09 NOTE — Assessment & Plan Note (Signed)
Rate controlled. Follow up with cardiology as scheduled.

## 2024-03-13 ENCOUNTER — Other Ambulatory Visit: Payer: Self-pay | Admitting: Nurse Practitioner

## 2024-03-13 DIAGNOSIS — E876 Hypokalemia: Secondary | ICD-10-CM

## 2024-03-21 ENCOUNTER — Ambulatory Visit: Payer: Self-pay

## 2024-03-21 DIAGNOSIS — H0100A Unspecified blepharitis right eye, upper and lower eyelids: Secondary | ICD-10-CM | POA: Diagnosis not present

## 2024-03-21 DIAGNOSIS — H5213 Myopia, bilateral: Secondary | ICD-10-CM | POA: Diagnosis not present

## 2024-03-21 DIAGNOSIS — H11823 Conjunctivochalasis, bilateral: Secondary | ICD-10-CM | POA: Diagnosis not present

## 2024-03-21 DIAGNOSIS — H0100B Unspecified blepharitis left eye, upper and lower eyelids: Secondary | ICD-10-CM | POA: Diagnosis not present

## 2024-03-21 DIAGNOSIS — H524 Presbyopia: Secondary | ICD-10-CM | POA: Diagnosis not present

## 2024-03-21 DIAGNOSIS — H04123 Dry eye syndrome of bilateral lacrimal glands: Secondary | ICD-10-CM | POA: Diagnosis not present

## 2024-03-21 DIAGNOSIS — H02403 Unspecified ptosis of bilateral eyelids: Secondary | ICD-10-CM | POA: Diagnosis not present

## 2024-03-21 DIAGNOSIS — H52203 Unspecified astigmatism, bilateral: Secondary | ICD-10-CM | POA: Diagnosis not present

## 2024-03-21 DIAGNOSIS — H26493 Other secondary cataract, bilateral: Secondary | ICD-10-CM | POA: Diagnosis not present

## 2024-04-09 DIAGNOSIS — L578 Other skin changes due to chronic exposure to nonionizing radiation: Secondary | ICD-10-CM | POA: Diagnosis not present

## 2024-04-09 DIAGNOSIS — C44729 Squamous cell carcinoma of skin of left lower limb, including hip: Secondary | ICD-10-CM | POA: Diagnosis not present

## 2024-04-09 DIAGNOSIS — L820 Inflamed seborrheic keratosis: Secondary | ICD-10-CM | POA: Diagnosis not present

## 2024-04-09 DIAGNOSIS — L570 Actinic keratosis: Secondary | ICD-10-CM | POA: Diagnosis not present

## 2024-04-09 DIAGNOSIS — L821 Other seborrheic keratosis: Secondary | ICD-10-CM | POA: Diagnosis not present

## 2024-04-16 DIAGNOSIS — I482 Chronic atrial fibrillation, unspecified: Secondary | ICD-10-CM | POA: Diagnosis not present

## 2024-04-16 DIAGNOSIS — J90 Pleural effusion, not elsewhere classified: Secondary | ICD-10-CM | POA: Diagnosis not present

## 2024-04-16 DIAGNOSIS — J209 Acute bronchitis, unspecified: Secondary | ICD-10-CM | POA: Diagnosis not present

## 2024-04-16 DIAGNOSIS — I5032 Chronic diastolic (congestive) heart failure: Secondary | ICD-10-CM | POA: Diagnosis not present

## 2024-04-16 DIAGNOSIS — I071 Rheumatic tricuspid insufficiency: Secondary | ICD-10-CM | POA: Diagnosis not present

## 2024-04-16 DIAGNOSIS — R051 Acute cough: Secondary | ICD-10-CM | POA: Diagnosis not present

## 2024-04-16 DIAGNOSIS — I272 Pulmonary hypertension, unspecified: Secondary | ICD-10-CM | POA: Diagnosis not present

## 2024-04-16 DIAGNOSIS — J449 Chronic obstructive pulmonary disease, unspecified: Secondary | ICD-10-CM | POA: Diagnosis not present

## 2024-04-19 ENCOUNTER — Telehealth: Payer: Self-pay | Admitting: Cardiology

## 2024-04-19 NOTE — Telephone Encounter (Signed)
 Called to confirm/remind patient of their appointment at [facility] on 04/20/24.   Appointment:   [x] Confirmed  [] Left mess   [] No answer/No voice mail  [] VM Full/unable to leave message  [] Phone not in service  Patient reminded to bring all medications and/or complete list.  Confirmed patient has transportation. Gave directions, instructed to utilize valet parking.

## 2024-04-20 ENCOUNTER — Encounter: Payer: Self-pay | Admitting: Cardiology

## 2024-04-20 ENCOUNTER — Other Ambulatory Visit
Admission: RE | Admit: 2024-04-20 | Discharge: 2024-04-20 | Disposition: A | Discharge status: Home / Self Care | Source: Ambulatory Visit | Attending: Cardiology | Admitting: Cardiology

## 2024-04-20 ENCOUNTER — Ambulatory Visit: Admitting: Cardiology

## 2024-04-20 VITALS — BP 123/62 | HR 64 | Wt 105.0 lb

## 2024-04-20 DIAGNOSIS — R0602 Shortness of breath: Secondary | ICD-10-CM | POA: Diagnosis present

## 2024-04-20 DIAGNOSIS — I25118 Atherosclerotic heart disease of native coronary artery with other forms of angina pectoris: Secondary | ICD-10-CM

## 2024-04-20 DIAGNOSIS — I272 Pulmonary hypertension, unspecified: Secondary | ICD-10-CM | POA: Insufficient documentation

## 2024-04-20 DIAGNOSIS — I110 Hypertensive heart disease with heart failure: Secondary | ICD-10-CM | POA: Insufficient documentation

## 2024-04-20 DIAGNOSIS — Z951 Presence of aortocoronary bypass graft: Secondary | ICD-10-CM | POA: Diagnosis not present

## 2024-04-20 DIAGNOSIS — I071 Rheumatic tricuspid insufficiency: Secondary | ICD-10-CM | POA: Diagnosis not present

## 2024-04-20 DIAGNOSIS — I270 Primary pulmonary hypertension: Secondary | ICD-10-CM | POA: Diagnosis not present

## 2024-04-20 DIAGNOSIS — I4821 Permanent atrial fibrillation: Secondary | ICD-10-CM | POA: Diagnosis not present

## 2024-04-20 LAB — BASIC METABOLIC PANEL WITH GFR
Anion gap: 9 (ref 5–15)
BUN: 14 mg/dL (ref 8–23)
CO2: 24 mmol/L (ref 22–32)
Calcium: 8.8 mg/dL — ABNORMAL LOW (ref 8.9–10.3)
Chloride: 105 mmol/L (ref 98–111)
Creatinine, Ser: 0.94 mg/dL (ref 0.44–1.00)
GFR, Estimated: 58 mL/min — ABNORMAL LOW (ref >60)
Glucose, Bld: 97 mg/dL (ref 70–99)
Potassium: 3.6 mmol/L (ref 3.5–5.1)
Sodium: 138 mmol/L (ref 135–145)

## 2024-04-20 LAB — BRAIN NATRIURETIC PEPTIDE: B Natriuretic Peptide: 194.5 pg/mL — ABNORMAL HIGH (ref 0.0–100.0)

## 2024-04-20 NOTE — Addendum Note (Signed)
 Addended by: Autry Legions A on: 04/20/2024 02:23 PM   Modules accepted: Orders

## 2024-04-20 NOTE — Progress Notes (Signed)
 [facility] NOTE  Referring Physician: Windell Hasty, DO  Primary Care: Windell Hasty, DO Primary Cardiologist: Dr. Jacquelynn Matter  CC: Heart failure  HPI: MELEANE Walker is a 89 y.o. female with history of CABG in 2007 for left main disease by Dr. Luna Salinas, persistent atrial fibrillation, hypertension, hyperlipidemia and history of hysterectomy in 2012 and recently diagnosed pulmonary hypertension and severe tricuspid regurgitation presenting today for follow up  Despite her age, [name] has remained very active.  She continues to go bowling and square dancing however now reports becoming increasingly limited due to exertional dyspnea.  Due to shortness of breath, patient had an echocardiogram that demonstrated severe tricuspid regurgitation with a severely dilated right atrium and severely elevated PA systolic pressures.  She had a right heart cath which further confirmed a PVR of 3-4 Wood units.    With additional of low dose GDMT, sildenafil  & digoxin , she has continued to partake in a bowling league, drives, performs ADLs independently and lives a highly functional life.   - Euvolemic on exam.  - Continue struggling with an upper respiratory infection.  - Bought a basketball hoop for her 89th birthday.   Current Outpatient Medications  Medication Sig Dispense Refill   acetaminophen  (TYLENOL ) 500 MG tablet Take 500 mg by mouth daily. May take 1 tablet in the afternoon     alendronate (FOSAMAX) 70 MG tablet Take 70 mg by mouth once a week.      apixaban  (ELIQUIS ) 2.5 MG TABS tablet Take 1 tablet (2.5 mg total) by mouth 2 (two) times daily. 180 tablet 3   atorvastatin  (LIPITOR) 20 MG tablet TAKE 1 TABLET BY MOUTH EVERY DAY 90 tablet 2   Calcium  Carbonate Antacid (CALCIUM  CARBONATE PO) Take 1 tablet by mouth 2 (two) times daily.     carboxymethylcellulose (REFRESH PLUS) 0.5 % SOLN Place 1 drop into both eyes as needed (dry eyes).     cholecalciferol (VITAMIN D3) 25 MCG  (1000 UNIT) tablet Take 1,000 Units by mouth daily.     digoxin  (LANOXIN ) 0.125 MG tablet Take 0.5 tablets (0.0625 mg total) by mouth daily. 45 tablet 3   docusate sodium (COLACE) 100 MG capsule Take 100 mg by mouth 2 (two) times daily.     famotidine  (PEPCID ) 20 MG tablet Take 20 mg by mouth at bedtime.     ferrous sulfate  325 (65 FE) MG tablet Take 1 tablet (325 mg total) by mouth daily.     Hypromellose (ARTIFICIAL TEARS OP) Place 1 drop into both eyes every 6 (six) hours as needed (dry eyes).     levothyroxine  (SYNTHROID , LEVOTHROID) 75 MCG tablet Take 37.5-75 mcg by mouth See admin instructions. Take 75 mg by mouth for 6 days a week and 37.5 mg every Saturday in the morning before breakfast     nitroGLYCERIN  (NITROSTAT ) 0.4 MG SL tablet Place 1 tablet (0.4 mg total) under the tongue every 5 (five) minutes as needed for chest pain. 25 tablet 0   Omega-3 Fatty Acids (FISH OIL) 1200 MG CAPS Take 1,200 mg by mouth 2 (two) times daily.     potassium chloride  (MICRO-K ) 10 MEQ CR capsule TAKE 1 CAPSULE BY MOUTH EVERY DAY 30 capsule 1   senna (SENNA LAX) 8.6 MG tablet Take 1 tablet (8.6 mg total) by mouth daily.     sildenafil  (REVATIO ) 20 MG tablet Take 0.5 tablets (10 mg total) by mouth in the morning and at bedtime. 30 tablet 3   spironolactone  (ALDACTONE )  25 MG tablet Take 0.5 tablets (12.5 mg total) by mouth daily. 45 tablet 3   Tiotropium Bromide -Olodaterol (STIOLTO RESPIMAT ) 2.5-2.5 MCG/ACT AERS Inhale 2.5 mcg into the lungs daily at 6 (six) AM. INHALE 2 PUFFS BY MOUTH INTO THE LUNGS DAILY 12 g 3   torsemide  (DEMADEX ) 20 MG tablet Take 2 tablets (40 MG) in the morning, and one tablet (20 MG) at bedtime. 90 tablet 5   vitamin B-12 (CYANOCOBALAMIN ) 1000 MCG tablet Take 1,000 mcg by mouth daily.     zolpidem  (AMBIEN ) 5 MG tablet Take 5 mg by mouth at bedtime.     No current facility-administered medications for this visit.    PHYSICAL EXAM: Vitals:   04/20/24 1338  BP: 123/62  Pulse: 64   SpO2: 98%   GENERAL: NAD Lungs- CTA CARDIAC:  JVP: 7 cm          Normal rate with regular rhythm. no murmur.  Pulses 2+. No edema.  ABDOMEN: Soft, non-tender, non-distended.  EXTREMITIES: Warm and well perfused.  NEUROLOGIC: No obvious FND   DATA REVIEW  ECG: 02/25/23: Atrial fibrillation  As per my personal interpretation  ECHO: 02/08/23 LVEF 55%, mildly reduced RV, severely dilated RA. severe TR, moderate MR,  As per my personal interpretation 10/12/23: LVEF 55%, mildly reduced RV function with moderate enlargement; severely dilated RA, severe TR, mild to mod MR, personally reviewed and interpreted.   CATH: 02/17/23:  Hemodynamic findings consistent with mild to moderate pulmonary hypertension.   Aortic saturation 98% noninvasively.  PA saturation 65%.  RA pressure 20/30, mean RA pressure 18 mmHg, RV pressure 58/3, PA pressure 57/19, mean PA pressure 34 mmHg, pulmonary capillary wedge pressure 18/29, mean pulmonary capillary wedge pressure 19.  Cardiac output 4.3 L/min, cardiac index 2.85, calculated PVR 3.48.   prominent V waves noted on the wedge tracing.   RA waveform suggestive of significant tricuspid regurgitation.   02/25/23: Pt ambulated 228.6 meters O2 Sat ranged 99 on 96 on room air HR ranged 77-86    ASSESSMENT & PLAN:  Pulmonary Hypertension, severe tricuspid regurgitation, RV dysfunction -Echocardiogram today 10/12/23 with preserved LV function; mild ot mod MR, severe TR with a severely dilated RA.  She has had a right heart cath with a PVR of 3.5 Wood units, RA pressure of 20-30 with large V waves and a PA mean of 34 mmHg. -She does have PFTs from 2017 that demonstrate obstructive lung disease that is at least moderate from secondhand smoking exposure.   - Continue sildenafil  10mg  TID; despite having underlying mitral regurgitation she has had improvement in dyspnea with the addition of sildenafil .  I believe this is likely due to reduction in TR /RV  afterload.  - Will consider addition of low dose losartan in the future. - CT chest (03/24/23) w/o significant parenchymal lung disease.  - Continue digoxin  0.0652mcg; will repeat levels today.  - Continue bisoprolol  to 2.5mg ,HR 60s - Euvolemic on exam; 40mg  in the morning and 20mg  in the afternoon.  - Follow up 22m   2.  Long standing persistent atrial fibrillation -Continue bisoprolol  -apixaban  2.5mg  BID -EKG today with atrial fibrillation  3. CAD s/p CABG - followed by Dr. Jacquelynn Matter - No CP   Kayal Mula [facility] Mechanical Circulatory Support

## 2024-04-20 NOTE — Patient Instructions (Signed)
 Medication Changes:  No medication changes today!  Lab Work:  Go over to the [facility]. Go pass the gift shop and have your blood work completed.  We will only call you if the results are abnormal or if the provider would like to make medication changes.   Follow-Up in: Please follow up with the [facility] in 1 month with Dr. Bruce Caper.  At the [facility], you and your health needs are our priority. We have a designated team specialized in the treatment of Heart Failure. This Care Team includes your primary Heart Failure Specialized Cardiologist (physician), Advanced Practice Providers (APPs- Physician Assistants and Nurse Practitioners), and Pharmacist who all work together to provide you with the care you need, when you need it.   You may see any of the following providers on your designated Care Team at your next follow up:  Dr. Jules Oar Dr. Peder Bourdon Dr. Alwin Baars Dr. Judyth Nunnery Shawnee Dellen, FNP Bevely Brush, RPH-CPP  Please be sure to bring in all your medications bottles to every appointment.   Need to Contact Us :  If you have any questions or concerns before your next appointment please send us  a message through [place] or call our office at [phone].    TO LEAVE A MESSAGE FOR THE NURSE SELECT OPTION 2, PLEASE LEAVE A MESSAGE INCLUDING: YOUR NAME DATE OF BIRTH CALL BACK NUMBER REASON FOR CALL**this is important as we prioritize the call backs  YOU WILL RECEIVE A CALL BACK THE SAME DAY AS LONG AS YOU CALL BEFORE 4:00 PM

## 2024-05-01 ENCOUNTER — Other Ambulatory Visit: Payer: Self-pay | Admitting: Cardiology

## 2024-05-08 ENCOUNTER — Other Ambulatory Visit: Payer: Self-pay | Admitting: Nurse Practitioner

## 2024-05-08 DIAGNOSIS — E876 Hypokalemia: Secondary | ICD-10-CM

## 2024-05-15 ENCOUNTER — Other Ambulatory Visit: Payer: Self-pay | Admitting: Pulmonary Disease

## 2024-05-17 ENCOUNTER — Telehealth: Payer: Self-pay | Admitting: Cardiology

## 2024-05-17 NOTE — Telephone Encounter (Signed)
 Called to confirm/remind patient of their appointment at the [facility] on 05/18/24.   Appointment:   [x] Confirmed  [] Left mess   [] No answer/No voice mail  [] VM Full/unable to leave message  [] Phone not in service  Patient reminded to bring all medications and/or complete list.  Confirmed patient has transportation. Gave directions, instructed to utilize valet parking.

## 2024-05-18 ENCOUNTER — Ambulatory Visit: Attending: Cardiology | Admitting: Cardiology

## 2024-05-18 VITALS — BP 130/65 | HR 68 | Wt 108.0 lb

## 2024-05-18 DIAGNOSIS — I25118 Atherosclerotic heart disease of native coronary artery with other forms of angina pectoris: Secondary | ICD-10-CM | POA: Diagnosis not present

## 2024-05-18 DIAGNOSIS — I50812 Chronic right heart failure: Secondary | ICD-10-CM | POA: Diagnosis not present

## 2024-05-18 DIAGNOSIS — I4811 Longstanding persistent atrial fibrillation: Secondary | ICD-10-CM | POA: Diagnosis not present

## 2024-05-18 DIAGNOSIS — Z951 Presence of aortocoronary bypass graft: Secondary | ICD-10-CM | POA: Diagnosis not present

## 2024-05-18 DIAGNOSIS — J984 Other disorders of lung: Secondary | ICD-10-CM | POA: Diagnosis not present

## 2024-05-18 DIAGNOSIS — I361 Nonrheumatic tricuspid (valve) insufficiency: Secondary | ICD-10-CM | POA: Insufficient documentation

## 2024-05-18 DIAGNOSIS — I272 Pulmonary hypertension, unspecified: Secondary | ICD-10-CM | POA: Insufficient documentation

## 2024-05-18 DIAGNOSIS — Z7901 Long term (current) use of anticoagulants: Secondary | ICD-10-CM | POA: Insufficient documentation

## 2024-05-18 DIAGNOSIS — I2510 Atherosclerotic heart disease of native coronary artery without angina pectoris: Secondary | ICD-10-CM | POA: Diagnosis not present

## 2024-05-18 DIAGNOSIS — E785 Hyperlipidemia, unspecified: Secondary | ICD-10-CM | POA: Insufficient documentation

## 2024-05-18 DIAGNOSIS — I071 Rheumatic tricuspid insufficiency: Secondary | ICD-10-CM | POA: Diagnosis not present

## 2024-05-18 DIAGNOSIS — I4821 Permanent atrial fibrillation: Secondary | ICD-10-CM

## 2024-05-18 DIAGNOSIS — Z7722 Contact with and (suspected) exposure to environmental tobacco smoke (acute) (chronic): Secondary | ICD-10-CM | POA: Diagnosis not present

## 2024-05-18 DIAGNOSIS — I110 Hypertensive heart disease with heart failure: Secondary | ICD-10-CM | POA: Insufficient documentation

## 2024-05-18 NOTE — Progress Notes (Signed)
 [facility] NOTE  Referring Physician: Valentin Skates, DO  Primary Care: Valentin Skates, DO Primary Cardiologist: Dr. Dann  CC: Heart failure  HPI: [name] is a 89 y.o. female with history of CABG in 2007 for left main disease by Dr. Kerrin, persistent atrial fibrillation, hypertension, hyperlipidemia and history of hysterectomy in 2012 and recently diagnosed pulmonary hypertension and severe tricuspid regurgitation presenting today for follow up  Despite her age, [name] has remained very active.  She continues to go bowling and square dancing however now reports becoming increasingly limited due to exertional dyspnea.  Due to shortness of breath, patient had an echocardiogram that demonstrated severe tricuspid regurgitation with a severely dilated right atrium and severely elevated PA systolic pressures.  She had a right heart cath which further confirmed a PVR of 3-4 Wood units.    With additional of low dose GDMT, sildenafil  & digoxin , she has continued to partake in a bowling league, drives, performs ADLs independently and lives a highly functional life.   - Doing very well; she has had mild abdominal distention and lower extremity edema.   Current Outpatient Medications  Medication Sig Dispense Refill   acetaminophen  (TYLENOL ) 500 MG tablet Take 500 mg by mouth daily. May take 1 tablet in the afternoon     alendronate (FOSAMAX) 70 MG tablet Take 70 mg by mouth once a week.      apixaban  (ELIQUIS ) 2.5 MG TABS tablet Take 1 tablet (2.5 mg total) by mouth 2 (two) times daily. 180 tablet 3   atorvastatin  (LIPITOR) 20 MG tablet TAKE 1 TABLET BY MOUTH EVERY DAY 90 tablet 2   Calcium  Carbonate Antacid (CALCIUM  CARBONATE PO) Take 1 tablet by mouth 2 (two) times daily.     carboxymethylcellulose (REFRESH PLUS) 0.5 % SOLN Place 1 drop into both eyes as needed (dry eyes).     cholecalciferol (VITAMIN D3) 25 MCG (1000 UNIT) tablet Take 1,000 Units by mouth  daily.     digoxin  (LANOXIN ) 0.125 MG tablet Take 0.5 tablets (0.0625 mg total) by mouth daily. 45 tablet 3   docusate sodium (COLACE) 100 MG capsule Take 100 mg by mouth 2 (two) times daily.     famotidine  (PEPCID ) 20 MG tablet Take 20 mg by mouth at bedtime.     ferrous sulfate  325 (65 FE) MG tablet Take 1 tablet (325 mg total) by mouth daily.     Hypromellose (ARTIFICIAL TEARS OP) Place 1 drop into both eyes every 6 (six) hours as needed (dry eyes).     levothyroxine  (SYNTHROID , LEVOTHROID) 75 MCG tablet Take 37.5-75 mcg by mouth See admin instructions. Take 75 mg by mouth for 6 days a week and 37.5 mg every Saturday in the morning before breakfast     Omega-3 Fatty Acids (FISH OIL) 1200 MG CAPS Take 1,200 mg by mouth 2 (two) times daily.     potassium chloride  (MICRO-K ) 10 MEQ CR capsule TAKE 1 CAPSULE BY MOUTH EVERY DAY 30 capsule 1   senna (SENNA LAX) 8.6 MG tablet Take 1 tablet (8.6 mg total) by mouth daily.     sildenafil  (REVATIO ) 20 MG tablet TAKE 0.5 TABLET (10 MG TOTAL) BY MOUTH IN THE MORNING AND AT BEDTIME. 30 tablet 3   spironolactone  (ALDACTONE ) 25 MG tablet Take 0.5 tablets (12.5 mg total) by mouth daily. 45 tablet 3   Tiotropium Bromide -Olodaterol (STIOLTO RESPIMAT ) 2.5-2.5 MCG/ACT AERS INHALE 2.5 MCG INTO THE LUNGS DAILY AT 6 (SIX) AM. INHALE 2 PUFFS  BY MOUTH INTO THE LUNGS DAILY 4 g 5   torsemide  (DEMADEX ) 20 MG tablet Take 2 tablets (40 MG) in the morning, and one tablet (20 MG) at bedtime. 90 tablet 5   vitamin B-12 (CYANOCOBALAMIN ) 1000 MCG tablet Take 1,000 mcg by mouth daily.     zolpidem  (AMBIEN ) 5 MG tablet Take 5 mg by mouth at bedtime.     nitroGLYCERIN  (NITROSTAT ) 0.4 MG SL tablet Place 1 tablet (0.4 mg total) under the tongue every 5 (five) minutes as needed for chest pain. (Patient not taking: Reported on 05/18/2024) 25 tablet 0   No current facility-administered medications for this visit.    PHYSICAL EXAM: Vitals:   05/18/24 1143  BP: 130/65  Pulse: 68   SpO2: 98%   GENERAL: NAD Lungs- CTA CARDIAC:  JVP: 9 cm          Normal rate with regular rhythm. 3/6 systolic murmur.  Pulses 2+. trace edema.  ABDOMEN: Soft, non-tender, non-distended.  EXTREMITIES: Warm and well perfused.  NEUROLOGIC: No obvious FND    DATA REVIEW  ECG: 02/25/23: Atrial fibrillation  As per my personal interpretation  ECHO: 02/08/23 LVEF 55%, mildly reduced RV, severely dilated RA. severe TR, moderate MR,  As per my personal interpretation 10/12/23: LVEF 55%, mildly reduced RV function with moderate enlargement; severely dilated RA, severe TR, mild to mod MR, personally reviewed and interpreted.   CATH: 02/17/23:  Hemodynamic findings consistent with mild to moderate pulmonary hypertension.   Aortic saturation 98% noninvasively.  PA saturation 65%.  RA pressure 20/30, mean RA pressure 18 mmHg, RV pressure 58/3, PA pressure 57/19, mean PA pressure 34 mmHg, pulmonary capillary wedge pressure 18/29, mean pulmonary capillary wedge pressure 19.  Cardiac output 4.3 L/min, cardiac index 2.85, calculated PVR 3.48.   prominent V waves noted on the wedge tracing.   RA waveform suggestive of significant tricuspid regurgitation.   02/25/23: Pt ambulated 228.6 meters O2 Sat ranged 99 on 96 on room air HR ranged 77-86    ASSESSMENT & PLAN:  Pulmonary Hypertension, severe tricuspid regurgitation, RV dysfunction -Echocardiogram today 10/12/23 with preserved LV function; mild ot mod MR, severe TR with a severely dilated RA.  She has had a right heart cath with a PVR of 3.5 Wood units, RA pressure of 20-30 with large V waves and a PA mean of 34 mmHg. -She does have PFTs from 2017 that demonstrate obstructive lung disease that is at least moderate from secondhand smoking exposure.   - Continue sildenafil  10mg  TID; despite having underlying mitral regurgitation she has had improvement in dyspnea with the addition of sildenafil .  I believe this is likely due to reduction in  TR /RV afterload.  - Will consider addition of low dose losartan in the future. - CT chest (03/24/23) w/o significant parenchymal lung disease.  - Continue digoxin  0.0672mcg; will repeat levels today.  - Continue bisoprolol  to 2.5mg ,HR 60s - mildly hypervolemic on exam; currently torsemide  40mg  in the morning and 20mg  in the afternoon.  - Take extra torsemide  20mg  today and tomorrow AM.  - Follow up 6 weeks.   2.  Long standing persistent atrial fibrillation -Continue bisoprolol  -apixaban  2.5mg  BID -no changes  3. CAD s/p CABG - followed by Dr. Dann - No CP   Dwan Hemmelgarn [facility] Mechanical Circulatory Support

## 2024-05-18 NOTE — Patient Instructions (Signed)
 Medication Changes:  THIS EVENING Take 1 extra tab of Torsemide  (40mg  = 2 tabs)  TOMORROW MORNING Take 1 extra tab of Torsemide  (60mg  = 3 tabs) then resume your regular dose   Follow-Up in: please follow up with the [facility] in 6-8 weeks with Dr. Gardenia.   At the [facility], you and your health needs are our priority. We have a designated team specialized in the treatment of Heart Failure. This Care Team includes your primary Heart Failure Specialized Cardiologist (physician), Advanced Practice Providers (APPs- Physician Assistants and Nurse Practitioners), and Pharmacist who all work together to provide you with the care you need, when you need it.   You may see any of the following providers on your designated Care Team at your next follow up:  Dr. Toribio Fuel Dr. Ezra Shuck Dr. Ria Gardenia Dr. Odis Brownie Ellouise Class, FNP Jaun Bash, RPH-CPP  Please be sure to bring in all your medications bottles to every appointment.   Need to Contact Us :  If you have any questions or concerns before your next appointment please send us  a message through [place] or call our office at [phone].    TO LEAVE A MESSAGE FOR THE NURSE SELECT OPTION 2, PLEASE LEAVE A MESSAGE INCLUDING: YOUR NAME DATE OF BIRTH CALL BACK NUMBER REASON FOR CALL**this is important as we prioritize the call backs  YOU WILL RECEIVE A CALL BACK THE SAME DAY AS LONG AS YOU CALL BEFORE 4:00 PM

## 2024-05-29 DIAGNOSIS — L820 Inflamed seborrheic keratosis: Secondary | ICD-10-CM | POA: Diagnosis not present

## 2024-05-29 DIAGNOSIS — L570 Actinic keratosis: Secondary | ICD-10-CM | POA: Diagnosis not present

## 2024-05-29 DIAGNOSIS — L578 Other skin changes due to chronic exposure to nonionizing radiation: Secondary | ICD-10-CM | POA: Diagnosis not present

## 2024-05-29 DIAGNOSIS — L821 Other seborrheic keratosis: Secondary | ICD-10-CM | POA: Diagnosis not present

## 2024-06-30 ENCOUNTER — Other Ambulatory Visit: Payer: Self-pay | Admitting: Nurse Practitioner

## 2024-06-30 DIAGNOSIS — E876 Hypokalemia: Secondary | ICD-10-CM

## 2024-07-03 NOTE — Telephone Encounter (Signed)
 Please direct further refills to her PCP or cardiology for potassium. Thanks.

## 2024-07-12 ENCOUNTER — Telehealth: Payer: Self-pay | Admitting: Cardiology

## 2024-07-12 NOTE — Telephone Encounter (Signed)
 Called to confirm/remind patient of their appointment at the [facility] on 07/13/24.   Appointment:   [] Confirmed  [x] Left mess   [] No answer/No voice mail  [] VM Full/unable to leave message  [] Phone not in service  Patient reminded to bring all medications and/or complete list.  Confirmed patient has transportation. Gave directions, instructed to utilize valet parking.

## 2024-07-13 ENCOUNTER — Other Ambulatory Visit
Admission: RE | Admit: 2024-07-13 | Discharge: 2024-07-13 | Disposition: A | Discharge status: Home / Self Care | Source: Ambulatory Visit | Attending: Cardiology | Admitting: Cardiology

## 2024-07-13 ENCOUNTER — Ambulatory Visit: Admitting: Cardiology

## 2024-07-13 ENCOUNTER — Encounter: Payer: Self-pay | Admitting: Cardiology

## 2024-07-13 VITALS — BP 133/51 | HR 60 | Wt 111.1 lb

## 2024-07-13 DIAGNOSIS — E785 Hyperlipidemia, unspecified: Secondary | ICD-10-CM | POA: Diagnosis not present

## 2024-07-13 DIAGNOSIS — I272 Pulmonary hypertension, unspecified: Secondary | ICD-10-CM | POA: Diagnosis not present

## 2024-07-13 DIAGNOSIS — I081 Rheumatic disorders of both mitral and tricuspid valves: Secondary | ICD-10-CM | POA: Diagnosis not present

## 2024-07-13 DIAGNOSIS — Z951 Presence of aortocoronary bypass graft: Secondary | ICD-10-CM

## 2024-07-13 DIAGNOSIS — Z7722 Contact with and (suspected) exposure to environmental tobacco smoke (acute) (chronic): Secondary | ICD-10-CM | POA: Diagnosis not present

## 2024-07-13 DIAGNOSIS — Z79899 Other long term (current) drug therapy: Secondary | ICD-10-CM | POA: Insufficient documentation

## 2024-07-13 DIAGNOSIS — J449 Chronic obstructive pulmonary disease, unspecified: Secondary | ICD-10-CM | POA: Diagnosis not present

## 2024-07-13 DIAGNOSIS — I4811 Longstanding persistent atrial fibrillation: Secondary | ICD-10-CM | POA: Diagnosis not present

## 2024-07-13 DIAGNOSIS — I071 Rheumatic tricuspid insufficiency: Secondary | ICD-10-CM | POA: Diagnosis not present

## 2024-07-13 DIAGNOSIS — I110 Hypertensive heart disease with heart failure: Secondary | ICD-10-CM | POA: Diagnosis not present

## 2024-07-13 DIAGNOSIS — Z7901 Long term (current) use of anticoagulants: Secondary | ICD-10-CM | POA: Diagnosis not present

## 2024-07-13 DIAGNOSIS — I50810 Right heart failure, unspecified: Secondary | ICD-10-CM | POA: Diagnosis not present

## 2024-07-13 LAB — BASIC METABOLIC PANEL WITH GFR
Anion gap: 12 (ref 5–15)
BUN: 15 mg/dL (ref 8–23)
CO2: 25 mmol/L (ref 22–32)
Calcium: 9.1 mg/dL (ref 8.9–10.3)
Chloride: 104 mmol/L (ref 98–111)
Creatinine, Ser: 1.14 mg/dL — ABNORMAL HIGH (ref 0.44–1.00)
GFR, Estimated: 46 mL/min — ABNORMAL LOW (ref >60)
Glucose, Bld: 91 mg/dL (ref 70–99)
Potassium: 3.8 mmol/L (ref 3.5–5.1)
Sodium: 141 mmol/L (ref 135–145)

## 2024-07-13 LAB — BRAIN NATRIURETIC PEPTIDE: B Natriuretic Peptide: 172 pg/mL — ABNORMAL HIGH (ref 0.0–100.0)

## 2024-07-13 NOTE — Patient Instructions (Signed)
 Medication Changes:  No medication changes today!  Lab Work:  Go over to the [facility]. Go pass the gift shop and have your blood work completed.  We will only call you if the results are abnormal or if the provider would like to make medication changes.  No news is good news.    Follow-Up in: Please follow up with the [facility] in 3 months with Dr. Gardenia.   Thank you for choosing [facility].    At the [facility], you and your health needs are our priority. We have a designated team specialized in the treatment of Heart Failure. This Care Team includes your primary Heart Failure Specialized Cardiologist (physician), Advanced Practice Providers (APPs- Physician Assistants and Nurse Practitioners), and Pharmacist who all work together to provide you with the care you need, when you need it.   You may see any of the following providers on your designated Care Team at your next follow up:  Dr. Toribio Fuel Dr. Ezra Shuck Dr. Ria Gardenia Dr. Morene Brownie Ellouise Class, FNP Jaun Bash, RPH-CPP  Please be sure to bring in all your medications bottles to every appointment.   Need to Contact Us :  If you have any questions or concerns before your next appointment please send us  a message through [place] or call our office at [phone].    TO LEAVE A MESSAGE FOR THE NURSE SELECT OPTION 2, PLEASE LEAVE A MESSAGE INCLUDING: YOUR NAME DATE OF BIRTH CALL BACK NUMBER REASON FOR CALL**this is important as we prioritize the call backs  YOU WILL RECEIVE A CALL BACK THE SAME DAY AS LONG AS YOU CALL BEFORE 4:00 PM

## 2024-07-13 NOTE — Addendum Note (Signed)
 Addended by: SHARL GRATE A on: 07/13/2024 03:43 PM   Modules accepted: Orders

## 2024-07-13 NOTE — Progress Notes (Signed)
 ADVANCED HEART FAILURE CLINIC NOTE  Referring Physician: Valentin Skates, DO  Primary Care: Valentin Skates, DO Primary Cardiologist: Dr. Dann  CC: Heart failure  HPI: [name] is a 89 y.o. female with history of CABG in 2007 for left main disease by Dr. Kerrin, persistent atrial fibrillation, hypertension, hyperlipidemia and history of hysterectomy in 2012 and recently diagnosed pulmonary hypertension and severe tricuspid regurgitation presenting today for follow up  Despite her age, [name] has remained very active.  She continues to go bowling and square dancing however now reports becoming increasingly limited due to exertional dyspnea.  Due to shortness of breath, patient had an echocardiogram that demonstrated severe tricuspid regurgitation with a severely dilated right atrium and severely elevated PA systolic pressures.  She had a right heart cath which further confirmed a PVR of 3-4 Wood units.    With additional of low dose GDMT, sildenafil  & digoxin , she has continued to partake in a bowling league, drives, performs ADLs independently and lives a highly functional life.   - Doing very well; she has had mild abdominal distention and lower extremity edema.   Current Outpatient Medications  Medication Sig Dispense Refill   acetaminophen  (TYLENOL ) 500 MG tablet Take 500 mg by mouth daily. May take 1 tablet in the afternoon     alendronate (FOSAMAX) 70 MG tablet Take 70 mg by mouth once a week.      apixaban  (ELIQUIS ) 2.5 MG TABS tablet Take 1 tablet (2.5 mg total) by mouth 2 (two) times daily. 180 tablet 3   atorvastatin  (LIPITOR) 20 MG tablet TAKE 1 TABLET BY MOUTH EVERY DAY 90 tablet 2   Calcium  Carbonate Antacid (CALCIUM  CARBONATE PO) Take 1 tablet by mouth 2 (two) times daily.     carboxymethylcellulose (REFRESH PLUS) 0.5 % SOLN Place 1 drop into both eyes as needed (dry eyes).     cholecalciferol (VITAMIN D3) 25 MCG (1000 UNIT) tablet Take 1,000 Units by mouth  daily.     digoxin  (LANOXIN ) 0.125 MG tablet Take 0.5 tablets (0.0625 mg total) by mouth daily. 45 tablet 3   famotidine  (PEPCID ) 20 MG tablet Take 20 mg by mouth at bedtime.     ferrous sulfate  325 (65 FE) MG tablet Take 1 tablet (325 mg total) by mouth daily.     Hypromellose (ARTIFICIAL TEARS OP) Place 1 drop into both eyes every 6 (six) hours as needed (dry eyes).     levothyroxine  (SYNTHROID , LEVOTHROID) 75 MCG tablet Take 37.5-75 mcg by mouth See admin instructions. Take 75 mg by mouth for 6 days a week and 37.5 mg every Saturday in the morning before breakfast     nitroGLYCERIN  (NITROSTAT ) 0.4 MG SL tablet Place 1 tablet (0.4 mg total) under the tongue every 5 (five) minutes as needed for chest pain. 25 tablet 0   Omega-3 Fatty Acids (FISH OIL) 1200 MG CAPS Take 1,200 mg by mouth 2 (two) times daily.     potassium chloride  (MICRO-K ) 10 MEQ CR capsule TAKE 1 CAPSULE BY MOUTH EVERY DAY 30 capsule 1   senna (SENNA LAX) 8.6 MG tablet Take 1 tablet (8.6 mg total) by mouth daily.     sildenafil  (REVATIO ) 20 MG tablet TAKE 0.5 TABLET (10 MG TOTAL) BY MOUTH IN THE MORNING AND AT BEDTIME. 30 tablet 3   spironolactone  (ALDACTONE ) 25 MG tablet Take 0.5 tablets (12.5 mg total) by mouth daily. 45 tablet 3   Tiotropium Bromide -Olodaterol (STIOLTO RESPIMAT ) 2.5-2.5 MCG/ACT AERS INHALE 2.5 MCG INTO  THE LUNGS DAILY AT 6 (SIX) AM. INHALE 2 PUFFS BY MOUTH INTO THE LUNGS DAILY 4 g 5   torsemide  (DEMADEX ) 20 MG tablet Take 2 tablets (40 MG) in the morning, and one tablet (20 MG) at bedtime. 90 tablet 5   vitamin B-12 (CYANOCOBALAMIN ) 1000 MCG tablet Take 1,000 mcg by mouth daily.     zolpidem  (AMBIEN ) 5 MG tablet Take 5 mg by mouth at bedtime.     No current facility-administered medications for this visit.    PHYSICAL EXAM: Vitals:   07/13/24 1512  BP: (!) 133/51  Pulse: 60  SpO2: 98%   GENERAL: NAD Lungs- CTA CARDIAC:  JVP: 9 cm          Normal rate with regular rhythm. 3/6 systolic murmur.  Pulses  2+. trace edema.  ABDOMEN: Soft, non-tender, non-distended.  EXTREMITIES: Warm and well perfused.  NEUROLOGIC: No obvious FND    DATA REVIEW  ECG: 02/25/23: Atrial fibrillation  As per my personal interpretation  ECHO: 02/08/23 LVEF 55%, mildly reduced RV, severely dilated RA. severe TR, moderate MR,  As per my personal interpretation 10/12/23: LVEF 55%, mildly reduced RV function with moderate enlargement; severely dilated RA, severe TR, mild to mod MR, personally reviewed and interpreted.   CATH: 02/17/23:  Hemodynamic findings consistent with mild to moderate pulmonary hypertension.   Aortic saturation 98% noninvasively.  PA saturation 65%.  RA pressure 20/30, mean RA pressure 18 mmHg, RV pressure 58/3, PA pressure 57/19, mean PA pressure 34 mmHg, pulmonary capillary wedge pressure 18/29, mean pulmonary capillary wedge pressure 19.  Cardiac output 4.3 L/min, cardiac index 2.85, calculated PVR 3.48.   prominent V waves noted on the wedge tracing.   RA waveform suggestive of significant tricuspid regurgitation.   02/25/23: Pt ambulated 228.6 meters O2 Sat ranged 99 on 96 on room air HR ranged 77-86    ASSESSMENT & PLAN:  Pulmonary Hypertension, severe tricuspid regurgitation, RV dysfunction -Echocardiogram today 10/12/23 with preserved LV function; mild ot mod MR, severe TR with a severely dilated RA.  She has had a right heart cath with a PVR of 3.5 Wood units, RA pressure of 20-30 with large V waves and a PA mean of 34 mmHg. -She does have PFTs from 2017 that demonstrate obstructive lung disease that is at least moderate from secondhand smoking exposure.   - Continue sildenafil  10mg  TID; despite having underlying mitral regurgitation she has had improvement in dyspnea with the addition of sildenafil .  I believe this is likely due to reduction in TR /RV afterload.  - Will consider addition of low dose losartan in the future. - CT chest (03/24/23) w/o significant parenchymal lung  disease.  - Continue digoxin  0.0668mcg; will repeat levels today.  - Continue bisoprolol  to 2.5mg ,HR 60s - Currently on torsemide  40mg  in the morning and 20mg  in the evening.  - BNP down to 194 on 04/20/24; doing very well today. Reports dyspnea has improved significantly.  - Repeat BMP/BNP today.   2.  Long standing persistent atrial fibrillation -Continue bisoprolol  -apixaban  2.5mg  BID -no changes  3. CAD s/p CABG - followed by Dr. Dann - No CP  4. COPD  - secondary to 2nd hand smoke  - significant improvement after starting inhalers.    Alanmichael Barmore Advanced [facility] Mechanical Circulatory Support

## 2024-07-31 DIAGNOSIS — I110 Hypertensive heart disease with heart failure: Secondary | ICD-10-CM | POA: Diagnosis not present

## 2024-07-31 DIAGNOSIS — J449 Chronic obstructive pulmonary disease, unspecified: Secondary | ICD-10-CM | POA: Diagnosis not present

## 2024-07-31 DIAGNOSIS — I25118 Atherosclerotic heart disease of native coronary artery with other forms of angina pectoris: Secondary | ICD-10-CM | POA: Diagnosis not present

## 2024-07-31 DIAGNOSIS — I5032 Chronic diastolic (congestive) heart failure: Secondary | ICD-10-CM | POA: Diagnosis not present

## 2024-07-31 DIAGNOSIS — M79641 Pain in right hand: Secondary | ICD-10-CM | POA: Diagnosis not present

## 2024-07-31 DIAGNOSIS — M810 Age-related osteoporosis without current pathological fracture: Secondary | ICD-10-CM | POA: Diagnosis not present

## 2024-07-31 DIAGNOSIS — E876 Hypokalemia: Secondary | ICD-10-CM | POA: Diagnosis not present

## 2024-07-31 DIAGNOSIS — M545 Low back pain, unspecified: Secondary | ICD-10-CM | POA: Diagnosis not present

## 2024-07-31 DIAGNOSIS — I482 Chronic atrial fibrillation, unspecified: Secondary | ICD-10-CM | POA: Diagnosis not present

## 2024-07-31 DIAGNOSIS — Z23 Encounter for immunization: Secondary | ICD-10-CM | POA: Diagnosis not present

## 2024-07-31 DIAGNOSIS — I739 Peripheral vascular disease, unspecified: Secondary | ICD-10-CM | POA: Diagnosis not present

## 2024-07-31 DIAGNOSIS — D509 Iron deficiency anemia, unspecified: Secondary | ICD-10-CM | POA: Diagnosis not present

## 2024-07-31 DIAGNOSIS — I272 Pulmonary hypertension, unspecified: Secondary | ICD-10-CM | POA: Diagnosis not present

## 2024-08-02 ENCOUNTER — Telehealth (HOSPITAL_COMMUNITY): Payer: Self-pay | Admitting: Pharmacy Technician

## 2024-08-02 ENCOUNTER — Other Ambulatory Visit (HOSPITAL_COMMUNITY): Payer: Self-pay | Admitting: Pharmacy Technician

## 2024-08-02 DIAGNOSIS — M810 Age-related osteoporosis without current pathological fracture: Secondary | ICD-10-CM | POA: Insufficient documentation

## 2024-08-02 NOTE — Telephone Encounter (Addendum)
 NOTE: Per [facility] practice, MD approved biosimilars switch for Prolia per payor preferences.  Auth Submission: NO AUTH NEEDED Site of care: [facility] Payer: [employer] MEDICARE Medication & CPT/J Code(s) submitted: V4863 [id number] (denosumab-bbdz) Diagnosis Code: M81.0 Route of submission (phone, fax, portal):  Phone # Fax # Auth type: Buy/Bill HB Units/visits requested: 60mg  x 2 doses, q 6 months Reference number:  Approval from: 08/02/24 to 10/31/24    Dagoberto Armour, CPhT [facility] 08/02/2024

## 2024-08-13 ENCOUNTER — Other Ambulatory Visit: Payer: Self-pay

## 2024-08-13 DIAGNOSIS — I10 Essential (primary) hypertension: Secondary | ICD-10-CM

## 2024-08-13 MED ORDER — ATORVASTATIN CALCIUM 20 MG PO TABS: 20.0000 mg | ORAL_TABLET | Freq: Every day | ORAL | 2 refills | Status: AC

## 2024-08-15 ENCOUNTER — Other Ambulatory Visit (HOSPITAL_COMMUNITY): Payer: Self-pay | Admitting: Internal Medicine

## 2024-08-16 ENCOUNTER — Telehealth (HOSPITAL_COMMUNITY): Payer: Self-pay

## 2024-08-16 NOTE — Telephone Encounter (Signed)
 error

## 2024-08-23 ENCOUNTER — Ambulatory Visit (HOSPITAL_COMMUNITY)
Admission: RE | Admit: 2024-08-23 | Discharge: 2024-08-23 | Disposition: A | Discharge status: Home / Self Care | Source: Ambulatory Visit | Attending: Internal Medicine | Admitting: Internal Medicine

## 2024-08-23 VITALS — BP 145/64 | HR 70 | Temp 97.9°F | Resp 17

## 2024-08-23 DIAGNOSIS — M810 Age-related osteoporosis without current pathological fracture: Secondary | ICD-10-CM | POA: Diagnosis not present

## 2024-08-23 MED ORDER — DENOSUMAB-BBDZ 60 MG/ML ~~LOC~~ SOSY
60.0000 mg | PREFILLED_SYRINGE | Freq: Once | SUBCUTANEOUS | Status: AC
  Administered 2024-08-23: 60 mg via SUBCUTANEOUS

## 2024-08-23 MED ORDER — DENOSUMAB-BBDZ 60 MG/ML ~~LOC~~ SOSY
PREFILLED_SYRINGE | SUBCUTANEOUS | Status: AC
  Filled 2024-08-23: qty 1

## 2024-08-27 ENCOUNTER — Other Ambulatory Visit: Payer: Self-pay | Admitting: Nurse Practitioner

## 2024-08-27 DIAGNOSIS — E876 Hypokalemia: Secondary | ICD-10-CM

## 2024-09-11 DIAGNOSIS — I509 Heart failure, unspecified: Secondary | ICD-10-CM | POA: Diagnosis not present

## 2024-09-11 DIAGNOSIS — I272 Pulmonary hypertension, unspecified: Secondary | ICD-10-CM | POA: Diagnosis not present

## 2024-09-11 DIAGNOSIS — I4891 Unspecified atrial fibrillation: Secondary | ICD-10-CM | POA: Diagnosis not present

## 2024-09-11 DIAGNOSIS — I25119 Atherosclerotic heart disease of native coronary artery with unspecified angina pectoris: Secondary | ICD-10-CM | POA: Diagnosis not present

## 2024-09-11 DIAGNOSIS — I130 Hypertensive heart and chronic kidney disease with heart failure and stage 1 through stage 4 chronic kidney disease, or unspecified chronic kidney disease: Secondary | ICD-10-CM | POA: Diagnosis not present

## 2024-09-11 DIAGNOSIS — N1831 Chronic kidney disease, stage 3a: Secondary | ICD-10-CM | POA: Diagnosis not present

## 2024-09-11 DIAGNOSIS — J439 Emphysema, unspecified: Secondary | ICD-10-CM | POA: Diagnosis not present

## 2024-09-11 DIAGNOSIS — D6869 Other thrombophilia: Secondary | ICD-10-CM | POA: Diagnosis not present

## 2024-09-11 DIAGNOSIS — I739 Peripheral vascular disease, unspecified: Secondary | ICD-10-CM | POA: Diagnosis not present

## 2024-09-13 ENCOUNTER — Other Ambulatory Visit: Payer: Self-pay

## 2024-09-13 MED ORDER — SILDENAFIL CITRATE 20 MG PO TABS: 10.0000 mg | ORAL_TABLET | Freq: Two times a day (BID) | ORAL | 3 refills | Status: AC

## 2024-09-13 MED ORDER — TORSEMIDE 20 MG PO TABS: ORAL_TABLET | ORAL | 5 refills | Status: AC

## 2024-10-04 ENCOUNTER — Telehealth: Payer: Self-pay | Admitting: Family

## 2024-10-04 NOTE — Telephone Encounter (Signed)
 Called to confirm/remind patient of their appointment at the [facility] on 10/05/24.   Appointment:   [] Confirmed  [x] Left mess   [] No answer/No voice mail  [] VM Full/unable to leave message  [] Phone not in service  Patient reminded to bring all medications and/or complete list.  Confirmed patient has transportation. Gave directions, instructed to utilize valet parking.

## 2024-10-05 ENCOUNTER — Encounter: Admitting: Cardiology

## 2024-10-05 ENCOUNTER — Ambulatory Visit: Admitting: Family

## 2024-10-16 ENCOUNTER — Encounter: Payer: Self-pay | Admitting: Family

## 2024-10-16 ENCOUNTER — Other Ambulatory Visit
Admission: RE | Admit: 2024-10-16 | Discharge: 2024-10-16 | Disposition: A | Source: Ambulatory Visit | Attending: Family | Admitting: Family

## 2024-10-16 ENCOUNTER — Ambulatory Visit: Admitting: Family

## 2024-10-16 ENCOUNTER — Ambulatory Visit: Payer: Self-pay | Admitting: Family

## 2024-10-16 VITALS — BP 124/63 | HR 76 | Wt 109.0 lb

## 2024-10-16 DIAGNOSIS — I361 Nonrheumatic tricuspid (valve) insufficiency: Secondary | ICD-10-CM | POA: Insufficient documentation

## 2024-10-16 DIAGNOSIS — M62838 Other muscle spasm: Secondary | ICD-10-CM | POA: Insufficient documentation

## 2024-10-16 DIAGNOSIS — I272 Pulmonary hypertension, unspecified: Secondary | ICD-10-CM | POA: Insufficient documentation

## 2024-10-16 DIAGNOSIS — Z951 Presence of aortocoronary bypass graft: Secondary | ICD-10-CM | POA: Diagnosis not present

## 2024-10-16 DIAGNOSIS — I4811 Longstanding persistent atrial fibrillation: Secondary | ICD-10-CM | POA: Insufficient documentation

## 2024-10-16 DIAGNOSIS — I4821 Permanent atrial fibrillation: Secondary | ICD-10-CM

## 2024-10-16 DIAGNOSIS — R252 Cramp and spasm: Secondary | ICD-10-CM

## 2024-10-16 DIAGNOSIS — J449 Chronic obstructive pulmonary disease, unspecified: Secondary | ICD-10-CM | POA: Diagnosis not present

## 2024-10-16 DIAGNOSIS — I25118 Atherosclerotic heart disease of native coronary artery with other forms of angina pectoris: Secondary | ICD-10-CM | POA: Diagnosis not present

## 2024-10-16 DIAGNOSIS — E785 Hyperlipidemia, unspecified: Secondary | ICD-10-CM | POA: Diagnosis not present

## 2024-10-16 DIAGNOSIS — I10 Essential (primary) hypertension: Secondary | ICD-10-CM | POA: Insufficient documentation

## 2024-10-16 DIAGNOSIS — R0602 Shortness of breath: Secondary | ICD-10-CM | POA: Diagnosis present

## 2024-10-16 DIAGNOSIS — I2510 Atherosclerotic heart disease of native coronary artery without angina pectoris: Secondary | ICD-10-CM | POA: Diagnosis not present

## 2024-10-16 LAB — BASIC METABOLIC PANEL WITH GFR
Anion gap: 13 (ref 5–15)
BUN: 14 mg/dL (ref 8–23)
CO2: 26 mmol/L (ref 22–32)
Calcium: 9.7 mg/dL (ref 8.9–10.3)
Chloride: 99 mmol/L (ref 98–111)
Creatinine, Ser: 0.91 mg/dL (ref 0.44–1.00)
GFR, Estimated: >60 mL/min (ref >60)
Glucose, Bld: 92 mg/dL (ref 70–99)
Potassium: 3.8 mmol/L (ref 3.5–5.1)
Sodium: 139 mmol/L (ref 135–145)

## 2024-10-16 LAB — PRO BRAIN NATRIURETIC PEPTIDE: Pro Brain Natriuretic Peptide: 1293 pg/mL — ABNORMAL HIGH (ref <300.0)

## 2024-10-16 LAB — MAGNESIUM: Magnesium: 2.1 mg/dL (ref 1.7–2.4)

## 2024-10-16 NOTE — Progress Notes (Signed)
 ADVANCED HEART FAILURE CLINIC NOTE  Referring Physician: Valentin Skates, DO  Primary Care: Valentin Skates, DO Primary Cardiologist: Dr. Dann  CC: shortness of breath   HPI: [name] is a 89 y.o. female with history of CABG in 2007 for left main disease by Dr. Kerrin, persistent atrial fibrillation, hypertension, hyperlipidemia and history of hysterectomy in 2012 and recently diagnosed pulmonary hypertension and severe tricuspid regurgitation presenting today for follow up  Despite her age, [name] has remained very active.  She continues to go bowling and square dancing however now reports becoming increasingly limited due to exertional dyspnea.  Due to shortness of breath, patient had an echocardiogram that demonstrated severe tricuspid regurgitation with a severely dilated right atrium and severely elevated PA systolic pressures.  She had a right heart cath which further confirmed a PVR of 3-4 Wood units.    With additional of low dose GDMT, sildenafil  & digoxin , she has continued to partake in a bowling league, drives, performs ADLs independently and lives a highly functional life.   She presents today for an acute HF visit with a chief complaint of minimal shortness of breath. Has associated fatigue, intermittent chest pain, abdominal distention, occasional dizziness, gradual weight gain. Sleeping ok except for cramping in her thighs. For the last couple of months, she's noticed intermittent cramping in her thighs which is relieved by ambulation. Reports a good appetite, chronic difficulty sleeping especially if she doesn't take her ambien . Reports eating/ drinking well.   ROS: All systems negative except what is listed in HPI, PMH and Problem List   Current Outpatient Medications  Medication Sig Dispense Refill   acetaminophen  (TYLENOL ) 500 MG tablet Take 500 mg by mouth daily. May take 1 tablet in the afternoon     alendronate (FOSAMAX) 70 MG tablet Take 70 mg by mouth  once a week.      apixaban  (ELIQUIS ) 2.5 MG TABS tablet Take 1 tablet (2.5 mg total) by mouth 2 (two) times daily. 180 tablet 3   atorvastatin  (LIPITOR) 20 MG tablet Take 1 tablet (20 mg total) by mouth daily. 90 tablet 2   Calcium  Carbonate Antacid (CALCIUM  CARBONATE PO) Take 1 tablet by mouth 2 (two) times daily.     carboxymethylcellulose (REFRESH PLUS) 0.5 % SOLN Place 1 drop into both eyes as needed (dry eyes).     cholecalciferol (VITAMIN D3) 25 MCG (1000 UNIT) tablet Take 1,000 Units by mouth daily.     digoxin  (LANOXIN ) 0.125 MG tablet Take 0.5 tablets (0.0625 mg total) by mouth daily. 45 tablet 3   famotidine  (PEPCID ) 20 MG tablet Take 20 mg by mouth at bedtime.     ferrous sulfate  325 (65 FE) MG tablet Take 1 tablet (325 mg total) by mouth daily.     Hypromellose (ARTIFICIAL TEARS OP) Place 1 drop into both eyes every 6 (six) hours as needed (dry eyes).     levothyroxine  (SYNTHROID , LEVOTHROID) 75 MCG tablet Take 37.5-75 mcg by mouth See admin instructions. Take 75 mg by mouth for 6 days a week and 37.5 mg every Saturday in the morning before breakfast     nitroGLYCERIN  (NITROSTAT ) 0.4 MG SL tablet Place 1 tablet (0.4 mg total) under the tongue every 5 (five) minutes as needed for chest pain. 25 tablet 0   Omega-3 Fatty Acids (FISH OIL) 1200 MG CAPS Take 1,200 mg by mouth 2 (two) times daily.     potassium chloride  (MICRO-K ) 10 MEQ CR capsule TAKE 1 CAPSULE BY MOUTH  EVERY DAY 30 capsule 1   senna (SENNA LAX) 8.6 MG tablet Take 1 tablet (8.6 mg total) by mouth daily.     sildenafil  (REVATIO ) 20 MG tablet Take 0.5 tablets (10 mg total) by mouth 2 (two) times daily. 90 tablet 3   spironolactone  (ALDACTONE ) 25 MG tablet Take 0.5 tablets (12.5 mg total) by mouth daily. 45 tablet 3   Tiotropium Bromide -Olodaterol (STIOLTO RESPIMAT ) 2.5-2.5 MCG/ACT AERS INHALE 2.5 MCG INTO THE LUNGS DAILY AT 6 (SIX) AM. INHALE 2 PUFFS BY MOUTH INTO THE LUNGS DAILY 4 g 5   torsemide  (DEMADEX ) 20 MG tablet Take 2  tablets (40 MG) in the morning, and one tablet (20 MG) at bedtime. 90 tablet 5   vitamin B-12 (CYANOCOBALAMIN ) 1000 MCG tablet Take 1,000 mcg by mouth daily.     zolpidem  (AMBIEN ) 5 MG tablet Take 5 mg by mouth at bedtime.     No current facility-administered medications for this visit.    PHYSICAL EXAM:  General: Well appearing.  Cor: No JVD. Regular rhythm, rate. 3/6 systolic murmur Lungs: clear Abdomen: soft, nontender, nondistended. Extremities: trace pitting edema bilateral lower legs Neuro:. Affect pleasant   DATA REVIEW  ECG: 02/25/23: Atrial fibrillation  As per my personal interpretation  ECHO: 02/08/23 LVEF 55%, mildly reduced RV, severely dilated RA. severe TR, moderate MR,  As per my personal interpretation 10/12/23: LVEF 55%, mildly reduced RV function with moderate enlargement; severely dilated RA, severe TR, mild to mod MR, personally reviewed and interpreted.   CATH: 02/17/23:  Hemodynamic findings consistent with mild to moderate pulmonary hypertension.   Aortic saturation 98% noninvasively.  PA saturation 65%.  RA pressure 20/30, mean RA pressure 18 mmHg, RV pressure 58/3, PA pressure 57/19, mean PA pressure 34 mmHg, pulmonary capillary wedge pressure 18/29, mean pulmonary capillary wedge pressure 19.  Cardiac output 4.3 L/min, cardiac index 2.85, calculated PVR 3.48.   prominent V waves noted on the wedge tracing.   RA waveform suggestive of significant tricuspid regurgitation.   02/25/23: Pt ambulated 228.6 meters O2 Sat ranged 99 on 96 on room air HR ranged 77-86    ASSESSMENT & PLAN:  Pulmonary Hypertension, severe tricuspid regurgitation, RV dysfunction - Echocardiogram 10/12/23 with preserved LV function; mild ot mod MR, severe TR with a severely dilated RA.  She has had a right heart cath with a PVR of 3.5 Wood units, RA pressure of 20-30 with large V waves and a PA mean of 34 mmHg. - She does have PFTs from 2017 that demonstrate obstructive lung  disease that is at least moderate from secondhand smoking exposure.   - Continue sildenafil  10mg  BID; despite having underlying mitral regurgitation she has had improvement in dyspnea with the addition of sildenafil .  I believe this is likely due to reduction in TR /RV afterload.  - May start low dose losartan but will get labs back first. I don't want to get her BP too low.  - CT chest (03/24/23) w/o significant parenchymal lung disease.  - Continue digoxin  0.0644mcg; dig level 07/20/23 was 0.5. Repeat this at next visit - Currently on torsemide  40mg  in the morning and 20mg  in the evening.  - BNP down to 194 on 04/20/24. proBNP today  2.  Long standing persistent atrial fibrillation -apixaban  2.5mg  BID -no changes  3. CAD s/p CABG - followed by Dr. Dann - rare, mild angina but not requiring SL NTG  4. COPD  - secondary to 2nd hand smoke  - significant improvement after  starting inhalers.   5: Muscle cramps- - BMET/ Mg today. Will call patient after lab results obtained - question whether leg cramping could be RLS since cramping improves after she gets up and walks around   Currently scheduled to return in 1 week so will keep that appointment for now, pending lab results. Will need to get established with another HF MD.    I spent 39 minutes reviewing records, interviewing/ examing patient and managing plan/ orders.   Ellouise DELENA Class [facility] 10/16/2024

## 2024-10-16 NOTE — Patient Instructions (Signed)
 Medication Changes:  No medication changes today!  Lab Work:  Go over to the [facility]. Go pass the gift shop and have your blood work completed.   We will only call you if the results are abnormal or if the provider would like to make medication changes.  No news is good news.    Follow-Up in: Please keep follow up appointment with the [facility] next week.    Thank you for choosing [facility].    At the [facility], you and your health needs are our priority. We have a designated team specialized in the treatment of Heart Failure. This Care Team includes your primary Heart Failure Specialized Cardiologist (physician), Advanced Practice Providers (APPs- Physician Assistants and Nurse Practitioners), and Pharmacist who all work together to provide you with the care you need, when you need it.   You may see any of the following providers on your designated Care Team at your next follow up:  Dr. Toribio Fuel Dr. Ezra Shuck Dr. Ria Commander Dr. Morene Brownie Ellouise Class, FNP Jaun Bash, RPH-CPP  Please be sure to bring in all your medications bottles to every appointment.   Need to Contact Us :  If you have any questions or concerns before your next appointment please send us  a message through [place] or call our office at [phone].    TO LEAVE A MESSAGE FOR THE NURSE SELECT OPTION 2, PLEASE LEAVE A MESSAGE INCLUDING: YOUR NAME DATE OF BIRTH CALL BACK NUMBER REASON FOR CALL**this is important as we prioritize the call backs  YOU WILL RECEIVE A CALL BACK THE SAME DAY AS LONG AS YOU CALL BEFORE 4:00 PM

## 2024-10-17 NOTE — Telephone Encounter (Signed)
 Spoke to pt about lab work, new medication and new appt. Pt verbalized understanding and is agreeable to changes. Pt states she will pick the medication up today. Orders placed. Appt moved. No further questions at this time.

## 2024-10-23 ENCOUNTER — Ambulatory Visit

## 2024-10-29 ENCOUNTER — Other Ambulatory Visit: Payer: Self-pay | Admitting: Pulmonary Disease

## 2024-10-29 DIAGNOSIS — E876 Hypokalemia: Secondary | ICD-10-CM

## 2024-11-05 ENCOUNTER — Telehealth: Payer: Self-pay | Admitting: Family

## 2024-11-06 ENCOUNTER — Other Ambulatory Visit: Payer: Self-pay

## 2024-11-06 MED ORDER — DIGOXIN 125 MCG PO TABS: 0.0625 mg | ORAL_TABLET | Freq: Every day | ORAL | 1 refills | Status: AC

## 2024-11-06 NOTE — Telephone Encounter (Signed)
Medication refilled to preferred pharmacy

## 2024-11-19 ENCOUNTER — Telehealth: Payer: Self-pay | Admitting: Family

## 2024-11-19 NOTE — Progress Notes (Unsigned)
 "  ADVANCED HEART FAILURE CLINIC NOTE  Referring Physician: Valentin Skates, DO  Primary Care: Marisa Skates, DO Primary Cardiologist: [name] HF Walker: formally [name]  CC: shortness of breath   HPI: [name] is a 89 y.o. female with history of CABG in 2007 for left main disease by [name], persistent atrial fibrillation, hypertension, hyperlipidemia and history of hysterectomy in 2012 and recently diagnosed pulmonary hypertension and severe tricuspid regurgitation presenting today for follow up  Despite her age, [name] has remained very active.  She continues to go bowling and square dancing however now reports becoming increasingly limited due to exertional dyspnea.  Due to shortness of breath, patient had an echocardiogram that demonstrated severe tricuspid regurgitation with a severely dilated right atrium and severely elevated PA systolic pressures.  She had a right heart cath which further confirmed a PVR of 3-4 Wood units.    With additional of low dose GDMT, sildenafil  & digoxin , she has continued to partake in a bowling league, drives, performs ADLs independently and lives a highly functional life.   She presents today, with her son, for a HF follow-up visit with a chief complaint of minimal shortness of breath. Has associated fatigue, dizziness with sudden position changes. Sleeping well. She reports feeling hoarse in the mornings when she wakes up. Denies chest pain, palpitations, pedal edema or difficulty sleeping. Tolerating her medications without any known side effects.  Continues to bowl and her son reports that she's been bowling quite well.   ROS: All systems negative except what is listed in HPI, PMH and Problem List   Current Outpatient Medications  Medication Sig Dispense Refill   acetaminophen  (TYLENOL ) 500 MG tablet Take 500 mg by mouth daily. May take 1 tablet in the afternoon     alendronate (FOSAMAX) 70 MG tablet Take 70 mg by  mouth once a week.  (Patient not taking: Reported on 10/16/2024)     apixaban  (ELIQUIS ) 2.5 MG TABS tablet Take 1 tablet (2.5 mg total) by mouth 2 (two) times daily. 180 tablet 3   atorvastatin  (LIPITOR) 20 MG tablet Take 1 tablet (20 mg total) by mouth daily. 90 tablet 2   Calcium  Carbonate Antacid (CALCIUM  CARBONATE PO) Take 1 tablet by mouth 2 (two) times daily.     carboxymethylcellulose (REFRESH PLUS) 0.5 % SOLN Place 1 drop into both eyes as needed (dry eyes).     cholecalciferol (VITAMIN D3) 25 MCG (1000 UNIT) tablet Take 1,000 Units by mouth daily.     digoxin  (LANOXIN ) 0.125 MG tablet Take 0.5 tablets (0.0625 mg total) by mouth daily. 45 tablet 1   famotidine  (PEPCID ) 20 MG tablet Take 20 mg by mouth at bedtime.     ferrous sulfate  325 (65 FE) MG tablet Take 1 tablet (325 mg total) by mouth daily.     Hypromellose (ARTIFICIAL TEARS OP) Place 1 drop into both eyes every 6 (six) hours as needed (dry eyes).     levothyroxine  (SYNTHROID , LEVOTHROID) 75 MCG tablet Take 37.5-75 mcg by mouth See admin instructions. Take 75 mg by mouth for 6 days a week and 37.5 mg every Saturday in the morning before breakfast     losartan (COZAAR) 25 MG tablet Take 0.5 tablets (12.5 mg total) by mouth daily. 45 tablet 1   nitroGLYCERIN  (NITROSTAT ) 0.4 MG SL tablet Place 1 tablet (0.4 mg total) under the tongue every 5 (five) minutes as needed for chest pain. 25 tablet 0   Omega-3 Fatty Acids (FISH  OIL) 1200 MG CAPS Take 1,200 mg by mouth 2 (two) times daily.     potassium chloride  (MICRO-K ) 10 MEQ CR capsule TAKE 1 CAPSULE BY MOUTH EVERY DAY 30 capsule 1   senna (SENNA LAX) 8.6 MG tablet Take 1 tablet (8.6 mg total) by mouth daily.     sildenafil  (REVATIO ) 20 MG tablet Take 0.5 tablets (10 mg total) by mouth 2 (two) times daily. 90 tablet 3   spironolactone  (ALDACTONE ) 25 MG tablet Take 0.5 tablets (12.5 mg total) by mouth daily. 45 tablet 3   Tiotropium Bromide -Olodaterol (STIOLTO RESPIMAT ) 2.5-2.5 MCG/ACT AERS  INHALE 2.5 MCG INTO THE LUNGS DAILY AT 6 (SIX) AM. INHALE 2 PUFFS BY MOUTH INTO THE LUNGS DAILY 4 g 5   torsemide  (DEMADEX ) 20 MG tablet Take 2 tablets (40 MG) in the morning, and one tablet (20 MG) at bedtime. 90 tablet 5   vitamin B-12 (CYANOCOBALAMIN ) 1000 MCG tablet Take 1,000 mcg by mouth daily.     zolpidem  (AMBIEN ) 5 MG tablet Take 5 mg by mouth at bedtime.     No current facility-administered medications for this visit.   Vitals:   11/20/24 1138  BP: 114/61  Pulse: 66  SpO2: 99%  Weight: 110 lb 2 oz (50 kg)   Wt Readings from Last 3 Encounters:  11/20/24 110 lb 2 oz (50 kg)  10/16/24 109 lb (49.4 kg)  07/13/24 111 lb 2 oz (50.4 kg)   Lab Results  Component Value Date   CREATININE 0.91 10/16/2024   CREATININE 1.14 (H) 07/13/2024   CREATININE 0.94 04/20/2024    PHYSICAL EXAM:  General: Well appearing.  Cor: No JVD. Regular rhythm, rate. 3/6 systolic murmur Lungs: clear Abdomen: soft, nontender, nondistended. Extremities: trace pitting edema bilateral lower legs Neuro:. Affect pleasant   DATA REVIEW  ECG: 02/25/23: Atrial fibrillation  As per my personal interpretation 04/20/24: Atrial fibrillation  ECHO: 02/08/23 LVEF 55%, mildly reduced RV, severely dilated RA. severe TR, moderate MR,  As per my personal interpretation 10/12/23: LVEF 55%, mildly reduced RV function with moderate enlargement; severely dilated RA, severe TR, mild to mod MR  CATH: 02/17/23:  Hemodynamic findings consistent with mild to moderate pulmonary hypertension.   Aortic saturation 98% noninvasively.  PA saturation 65%.  RA pressure 20/30, mean RA pressure 18 mmHg, RV pressure 58/3, PA pressure 57/19, mean PA pressure 34 mmHg, pulmonary capillary wedge pressure 18/29, mean pulmonary capillary wedge pressure 19.  Cardiac output 4.3 L/min, cardiac index 2.85, calculated PVR 3.48.   prominent V waves noted on the wedge tracing.   RA waveform suggestive of significant tricuspid  regurgitation.   02/25/23: Pt ambulated 228.6 meters O2 Sat ranged 99 on 96 on room air HR ranged 77-86    ASSESSMENT & PLAN:  Pulmonary Hypertension, severe tricuspid regurgitation, RV dysfunction - Echocardiogram 10/12/23 with preserved LV function; mild ot mod MR, severe TR with a severely dilated RA.  She has had a right heart cath with a PVR of 3.5 Wood units, RA pressure of 20-30 with large V waves and a PA mean of 34 mmHg. - She does have PFTs from 2017 that demonstrate obstructive lung disease that is at least moderate from secondhand smoking exposure.   - Continue sildenafil  10mg  BID; despite having underlying mitral regurgitation she has had improvement in dyspnea with the addition of sildenafil . I believe this is likely due to reduction in TR /RV afterload.  - Continue losartan 12.5mg  daily - CT chest (03/24/23) w/o significant parenchymal  lung disease.  - Continue digoxin  0.0645mcg; dig level 07/20/23 was 0.5. Dig level today - Continue torsemide  40mg  in the morning and 20mg  in the evening.  - Continue spironolactone  12.5mg  daily - BNP down to 194 on 04/20/24.  - weight stable from last visit here 1 month ago  2.  Long standing persistent atrial fibrillation -apixaban  2.5mg  BID -no changes  3. CAD s/p CABG - followed by [name] - rare, mild angina but not requiring SL NTG  4. COPD  - secondary to 2nd hand smoke  - significant improvement after starting inhalers.   5: HTN - BP 114/61 - BMET 10/16/24 reviewed: K 3.8, creatinine 0.91, GFR >60, Mg 2.1 - BMET today   Return in 3 months to see Dr. Zenaida in [facility], per her preference, sooner if needed.   I spent 31 minutes reviewing records, interviewing/ examing patient and managing plan/ orders.   Ellouise DELENA Class FNP-C 11/19/24   "

## 2024-11-19 NOTE — Telephone Encounter (Signed)
 Called to confirm/remind patient of their appointment at the [facility] on 11/20/24.   Appointment:   [x] Confirmed  [] Left mess   [] No answer/No voice mail  [] VM Full/unable to leave message  [] Phone not in service  Patient reminded to bring all medications and/or complete list.  Confirmed patient has transportation. Gave directions, instructed to utilize valet parking.

## 2024-11-20 ENCOUNTER — Other Ambulatory Visit
Admission: RE | Admit: 2024-11-20 | Discharge: 2024-11-20 | Disposition: A | Discharge status: Home / Self Care | Source: Ambulatory Visit | Attending: Family | Admitting: Family

## 2024-11-20 ENCOUNTER — Ambulatory Visit: Admitting: Family

## 2024-11-20 ENCOUNTER — Encounter: Payer: Self-pay | Admitting: Family

## 2024-11-20 ENCOUNTER — Ambulatory Visit: Payer: Self-pay | Admitting: Family

## 2024-11-20 VITALS — BP 114/61 | HR 66 | Wt 110.1 lb

## 2024-11-20 DIAGNOSIS — I10 Essential (primary) hypertension: Secondary | ICD-10-CM

## 2024-11-20 DIAGNOSIS — I272 Pulmonary hypertension, unspecified: Secondary | ICD-10-CM

## 2024-11-20 DIAGNOSIS — I4819 Other persistent atrial fibrillation: Secondary | ICD-10-CM

## 2024-11-20 DIAGNOSIS — J449 Chronic obstructive pulmonary disease, unspecified: Secondary | ICD-10-CM | POA: Diagnosis not present

## 2024-11-20 DIAGNOSIS — I25118 Atherosclerotic heart disease of native coronary artery with other forms of angina pectoris: Secondary | ICD-10-CM | POA: Diagnosis not present

## 2024-11-20 DIAGNOSIS — I509 Heart failure, unspecified: Secondary | ICD-10-CM | POA: Insufficient documentation

## 2024-11-20 LAB — BASIC METABOLIC PANEL WITH GFR
Anion gap: 13 (ref 5–15)
BUN: 14 mg/dL (ref 8–23)
CO2: 24 mmol/L (ref 22–32)
Calcium: 9.3 mg/dL (ref 8.9–10.3)
Chloride: 102 mmol/L (ref 98–111)
Creatinine, Ser: 0.93 mg/dL (ref 0.44–1.00)
GFR, Estimated: 58 mL/min — ABNORMAL LOW
Glucose, Bld: 93 mg/dL (ref 70–99)
Potassium: 4.1 mmol/L (ref 3.5–5.1)
Sodium: 140 mmol/L (ref 135–145)

## 2024-11-20 LAB — DIGOXIN LEVEL: Digoxin Level: 1.1 ng/mL (ref 0.8–2.0)

## 2024-11-20 NOTE — Patient Instructions (Signed)
 Medication Changes:  No medication changes today!  Lab Work:  Go over to the [facility]. Go pass the gift shop and have your blood work completed.  We will only call you if the results are abnormal or if the provider would like to make medication changes.  No news is good news.   Follow-Up in: Please follow up with the [facility] in 3 months with Dr. Zenaida.    Thank you for choosing [facility].    At the [facility], you and your health needs are our priority. We have a designated team specialized in the treatment of Heart Failure. This Care Team includes your primary Heart Failure Specialized Cardiologist (physician), Advanced Practice Providers (APPs- Physician Assistants and Nurse Practitioners), and Pharmacist who all work together to provide you with the care you need, when you need it.   You may see any of the following providers on your designated Care Team at your next follow up:  Dr. Toribio Fuel Dr. Ezra Shuck Dr. Ria Commander Dr. Morene Zenaida Ellouise Class, FNP Jaun Bash, RPH-CPP  Please be sure to bring in all your medications bottles to every appointment.   Need to Contact Us :  If you have any questions or concerns before your next appointment please send us  a message through [place] or call our office at [phone].    TO LEAVE A MESSAGE FOR THE NURSE SELECT OPTION 2, PLEASE LEAVE A MESSAGE INCLUDING: YOUR NAME DATE OF BIRTH CALL BACK NUMBER REASON FOR CALL**this is important as we prioritize the call backs  YOU WILL RECEIVE A CALL BACK THE SAME DAY AS LONG AS YOU CALL BEFORE 4:00 PM

## 2024-11-27 ENCOUNTER — Other Ambulatory Visit: Payer: Self-pay | Admitting: Pulmonary Disease

## 2024-12-25 ENCOUNTER — Ambulatory Visit: Admitting: Pulmonary Disease

## 2025-02-14 ENCOUNTER — Ambulatory Visit: Admitting: Cardiology

## 2025-02-22 ENCOUNTER — Encounter (HOSPITAL_COMMUNITY)
